# Patient Record
Sex: Female | Born: 1940 | Race: White | Hispanic: No | Marital: Married | Smoking: Former smoker
Health system: Southern US, Community
[De-identification: ages and names within clinical notes are randomized; demographics above are authoritative.]

## PROBLEM LIST (undated history)

## (undated) DIAGNOSIS — J449 Chronic obstructive pulmonary disease, unspecified: Secondary | ICD-10-CM

## (undated) DIAGNOSIS — K219 Gastro-esophageal reflux disease without esophagitis: Secondary | ICD-10-CM

## (undated) DIAGNOSIS — I509 Heart failure, unspecified: Secondary | ICD-10-CM

## (undated) DIAGNOSIS — I10 Essential (primary) hypertension: Secondary | ICD-10-CM

## (undated) DIAGNOSIS — E785 Hyperlipidemia, unspecified: Secondary | ICD-10-CM

## (undated) HISTORY — PX: CATARACT EXTRACTION, BILATERAL: SHX1313

## (undated) HISTORY — PX: VAGINAL HYSTERECTOMY: SUR661

## (undated) HISTORY — DX: Gastro-esophageal reflux disease without esophagitis: K21.9

## (undated) HISTORY — DX: Hyperlipidemia, unspecified: E78.5

## (undated) HISTORY — DX: Heart failure, unspecified: I50.9

## (undated) HISTORY — DX: Chronic obstructive pulmonary disease, unspecified: J44.9

## (undated) HISTORY — DX: Essential (primary) hypertension: I10

---

## 2005-03-20 ENCOUNTER — Ambulatory Visit: Payer: Self-pay | Admitting: Internal Medicine

## 2006-04-16 ENCOUNTER — Ambulatory Visit: Payer: Self-pay | Admitting: Family Medicine

## 2007-04-19 ENCOUNTER — Ambulatory Visit: Payer: Self-pay | Admitting: Family Medicine

## 2008-04-20 ENCOUNTER — Ambulatory Visit: Payer: Self-pay | Admitting: Family Medicine

## 2008-05-01 ENCOUNTER — Ambulatory Visit: Payer: Self-pay | Admitting: Family Medicine

## 2008-11-03 ENCOUNTER — Ambulatory Visit: Payer: Self-pay | Admitting: Family Medicine

## 2009-11-08 ENCOUNTER — Ambulatory Visit: Payer: Self-pay | Admitting: Family Medicine

## 2010-04-22 ENCOUNTER — Ambulatory Visit: Payer: Self-pay | Admitting: Family Medicine

## 2010-07-22 ENCOUNTER — Inpatient Hospital Stay: Payer: Self-pay | Admitting: Internal Medicine

## 2010-08-21 ENCOUNTER — Ambulatory Visit: Payer: Self-pay | Admitting: Gastroenterology

## 2010-09-08 HISTORY — PX: COLONOSCOPY: SHX5424

## 2011-04-29 ENCOUNTER — Ambulatory Visit: Payer: Self-pay | Admitting: Family Medicine

## 2012-05-04 ENCOUNTER — Ambulatory Visit: Payer: Self-pay | Admitting: Family Medicine

## 2013-05-18 ENCOUNTER — Ambulatory Visit: Payer: Self-pay | Admitting: Family Medicine

## 2013-08-16 ENCOUNTER — Ambulatory Visit: Payer: Self-pay | Admitting: Ophthalmology

## 2013-12-16 DIAGNOSIS — R011 Cardiac murmur, unspecified: Secondary | ICD-10-CM | POA: Insufficient documentation

## 2013-12-16 DIAGNOSIS — R0602 Shortness of breath: Secondary | ICD-10-CM | POA: Insufficient documentation

## 2014-07-11 ENCOUNTER — Ambulatory Visit: Payer: Self-pay | Admitting: Family Medicine

## 2014-07-25 ENCOUNTER — Ambulatory Visit: Payer: Self-pay | Admitting: Family Medicine

## 2014-11-01 ENCOUNTER — Ambulatory Visit: Payer: Self-pay | Admitting: Ophthalmology

## 2014-12-29 ENCOUNTER — Other Ambulatory Visit: Payer: Self-pay | Admitting: Family Medicine

## 2014-12-29 DIAGNOSIS — R921 Mammographic calcification found on diagnostic imaging of breast: Secondary | ICD-10-CM

## 2015-01-24 ENCOUNTER — Ambulatory Visit
Admission: RE | Admit: 2015-01-24 | Discharge: 2015-01-24 | Disposition: A | Discharge status: Home / Self Care | Payer: Medicare Other | Source: Ambulatory Visit | Attending: Family Medicine | Admitting: Family Medicine

## 2015-01-24 ENCOUNTER — Other Ambulatory Visit: Payer: Self-pay | Admitting: Family Medicine

## 2015-01-24 ENCOUNTER — Ambulatory Visit: Payer: Self-pay

## 2015-01-24 DIAGNOSIS — R921 Mammographic calcification found on diagnostic imaging of breast: Secondary | ICD-10-CM

## 2015-02-12 ENCOUNTER — Other Ambulatory Visit: Payer: Self-pay

## 2015-02-12 DIAGNOSIS — J302 Other seasonal allergic rhinitis: Secondary | ICD-10-CM

## 2015-02-12 MED ORDER — FLUTICASONE PROPIONATE 50 MCG/ACT NA SUSP: 2.0000 | Freq: Every day | NASAL | Status: DC

## 2015-06-06 ENCOUNTER — Ambulatory Visit (INDEPENDENT_AMBULATORY_CARE_PROVIDER_SITE_OTHER): Payer: Medicare Other | Admitting: Family Medicine

## 2015-06-06 ENCOUNTER — Encounter: Payer: Self-pay | Admitting: Family Medicine

## 2015-06-06 VITALS — BP 120/78 | HR 78 | Ht 64.0 in | Wt 136.0 lb

## 2015-06-06 DIAGNOSIS — E7800 Pure hypercholesterolemia, unspecified: Secondary | ICD-10-CM

## 2015-06-06 DIAGNOSIS — Z23 Encounter for immunization: Secondary | ICD-10-CM | POA: Diagnosis not present

## 2015-06-06 DIAGNOSIS — I429 Cardiomyopathy, unspecified: Secondary | ICD-10-CM | POA: Insufficient documentation

## 2015-06-06 DIAGNOSIS — Z0000 Encounter for general adult medical examination without abnormal findings: Secondary | ICD-10-CM | POA: Diagnosis not present

## 2015-06-06 DIAGNOSIS — K219 Gastro-esophageal reflux disease without esophagitis: Secondary | ICD-10-CM | POA: Insufficient documentation

## 2015-06-06 DIAGNOSIS — I509 Heart failure, unspecified: Secondary | ICD-10-CM | POA: Insufficient documentation

## 2015-06-06 DIAGNOSIS — Z1239 Encounter for other screening for malignant neoplasm of breast: Secondary | ICD-10-CM

## 2015-06-06 DIAGNOSIS — J449 Chronic obstructive pulmonary disease, unspecified: Secondary | ICD-10-CM | POA: Insufficient documentation

## 2015-06-06 DIAGNOSIS — N189 Chronic kidney disease, unspecified: Secondary | ICD-10-CM | POA: Insufficient documentation

## 2015-06-06 DIAGNOSIS — N1832 Chronic kidney disease, stage 3b: Secondary | ICD-10-CM | POA: Insufficient documentation

## 2015-06-06 MED ORDER — OMEPRAZOLE 20 MG PO CPDR: 20.0000 mg | DELAYED_RELEASE_CAPSULE | Freq: Every day | ORAL | Status: DC

## 2015-06-06 MED ORDER — ATORVASTATIN CALCIUM 20 MG PO TABS: 20.0000 mg | ORAL_TABLET | Freq: Every day | ORAL | Status: DC

## 2015-06-06 MED ORDER — FLUTICASONE-SALMETEROL 250-50 MCG/DOSE IN AEPB: 1.0000 | INHALATION_SPRAY | Freq: Two times a day (BID) | RESPIRATORY_TRACT | Status: DC

## 2015-06-06 NOTE — Progress Notes (Signed)
Name: Marie Villa   MRN: [id number]    DOB: [birth year]   Date:06/06/2015       Progress Note  Subjective  Chief Complaint  Chief Complaint  Patient presents with  . Medicare Wellness  . COPD  . Allergic Rhinitis   . Hyperlipidemia    Hyperlipidemia This is a chronic problem. The current episode started more than 1 year ago. The problem is controlled. Recent lipid tests were reviewed and are normal. She has no history of chronic renal disease, diabetes, hypothyroidism, liver disease, obesity or nephrotic syndrome. There are no known factors aggravating her hyperlipidemia. Pertinent negatives include no chest pain, focal sensory loss, focal weakness, leg pain, myalgias or shortness of breath. Current antihyperlipidemic treatment includes statins. The current treatment provides no improvement of lipids. There are no compliance problems.  Risk factors for coronary artery disease include dyslipidemia.    No problem-specific assessment & plan notes found for this encounter.   Past Medical History  Diagnosis Date  . GERD (gastroesophageal reflux disease)   . COPD (chronic obstructive pulmonary disease)   . Hyperlipidemia   . Congestive heart disease     Past Surgical History  Procedure Laterality Date  . Vaginal hysterectomy    . Colonoscopy  2012    normal- cleared for 10- Iftikhar    History reviewed. No pertinent family history.  Social History   Social History  . Marital Status: Married    Spouse Name: N/A  . Number of Children: N/A  . Years of Education: N/A   Occupational History  . Not on file.   Social History Main Topics  . Smoking status: Former Research scientist (life sciences)  . Smokeless tobacco: Not on file  . Alcohol Use: No  . Drug Use: No  . Sexual Activity: Yes   Other Topics Concern  . Not on file   Social History Narrative    Allergies  Allergen Reactions  . Sulfa Antibiotics      Review of Systems  Constitutional: Negative for fever, chills, weight loss and  malaise/fatigue.  HENT: Negative for ear discharge, ear pain and sore throat.   Eyes: Negative for blurred vision.  Respiratory: Negative for cough, sputum production, shortness of breath and wheezing.   Cardiovascular: Negative for chest pain, palpitations and leg swelling.  Gastrointestinal: Negative for heartburn, nausea, abdominal pain, diarrhea, constipation, blood in stool and melena.  Genitourinary: Negative for dysuria, urgency, frequency and hematuria.  Musculoskeletal: Negative for myalgias, back pain, joint pain and neck pain.  Skin: Negative for rash.  Neurological: Negative for dizziness, tingling, sensory change, focal weakness and headaches.  Endo/Heme/Allergies: Negative for environmental allergies and polydipsia. Does not bruise/bleed easily.  Psychiatric/Behavioral: Negative for depression and suicidal ideas. The patient is not nervous/anxious and does not have insomnia.      Objective  Filed Vitals:   06/06/15 0829  BP: 120/78  Pulse: 78  Height: 5\' 4"  (1.626 m)  Weight: 136 lb (61.689 kg)    Physical Exam  Constitutional: She is well-developed, well-nourished, and in no distress. No distress.  HENT:  Head: Normocephalic and atraumatic.  Right Ear: External ear normal.  Left Ear: External ear normal.  Nose: Nose normal.  Mouth/Throat: Oropharynx is clear and moist.  Eyes: Conjunctivae and EOM are normal. Pupils are equal, round, and reactive to light. Right eye exhibits no discharge. Left eye exhibits no discharge.  Neck: Normal range of motion. Neck supple. No JVD present. No thyromegaly present.  Cardiovascular: Normal rate, regular rhythm, normal  heart sounds and intact distal pulses.  Exam reveals no gallop and no friction rub.   No murmur heard. Pulmonary/Chest: Effort normal and breath sounds normal.  Abdominal: Soft. Bowel sounds are normal. She exhibits no mass. There is no tenderness. There is no guarding.  Musculoskeletal: Normal range of motion.  She exhibits no edema.  Lymphadenopathy:    She has no cervical adenopathy.  Neurological: She is alert. She has normal reflexes.  Skin: Skin is warm and dry. She is not diaphoretic.  Psychiatric: Mood and affect normal.      Assessment & Plan  Problem List Items Addressed This Visit      Digestive   Acid reflux   Relevant Medications   omeprazole (PRILOSEC) 20 MG capsule     Other   Hypercholesteremia   Relevant Medications   atorvastatin (LIPITOR) 20 MG tablet   carvedilol (COREG) 3.125 MG tablet   ramipril (ALTACE) 2.5 MG capsule    Other Visit Diagnoses    Medicare annual wellness visit, subsequent    -  Primary    Breast cancer screening             Dr. Macon [facility]  06/06/2015

## 2015-06-07 LAB — RENAL FUNCTION PANEL
ALBUMIN: 4.4 g/dL (ref 3.5–4.8)
BUN/Creatinine Ratio: 15 (ref 11–26)
BUN: 17 mg/dL (ref 8–27)
CO2: 22 mmol/L (ref 18–29)
CREATININE: 1.13 mg/dL — AB (ref 0.57–1.00)
Calcium: 9.3 mg/dL (ref 8.7–10.3)
Chloride: 103 mmol/L (ref 97–108)
GFR calc Af Amer: 55 mL/min/{1.73_m2} — ABNORMAL LOW (ref >59)
GFR, EST NON AFRICAN AMERICAN: 47 mL/min/{1.73_m2} — AB (ref >59)
Glucose: 80 mg/dL (ref 65–99)
PHOSPHORUS: 3.5 mg/dL (ref 2.5–4.5)
Potassium: 5 mmol/L (ref 3.5–5.2)
Sodium: 141 mmol/L (ref 134–144)

## 2015-06-07 LAB — LIPID PANEL
CHOL/HDL RATIO: 3.2 ratio (ref 0.0–4.4)
Cholesterol, Total: 207 mg/dL — ABNORMAL HIGH (ref 100–199)
HDL: 64 mg/dL (ref >39)
LDL CALC: 98 mg/dL (ref 0–99)
TRIGLYCERIDES: 223 mg/dL — AB (ref 0–149)
VLDL Cholesterol Cal: 45 mg/dL — ABNORMAL HIGH (ref 5–40)

## 2015-07-04 ENCOUNTER — Ambulatory Visit (INDEPENDENT_AMBULATORY_CARE_PROVIDER_SITE_OTHER): Payer: Medicare Other | Admitting: Family Medicine

## 2015-07-04 ENCOUNTER — Encounter: Payer: Self-pay | Admitting: Family Medicine

## 2015-07-04 VITALS — BP 110/60 | HR 76 | Ht 65.0 in | Wt 140.0 lb

## 2015-07-04 DIAGNOSIS — J441 Chronic obstructive pulmonary disease with (acute) exacerbation: Secondary | ICD-10-CM | POA: Diagnosis not present

## 2015-07-04 DIAGNOSIS — J0100 Acute maxillary sinusitis, unspecified: Secondary | ICD-10-CM

## 2015-07-04 MED ORDER — AMOXICILLIN 500 MG PO CAPS: 500.0000 mg | ORAL_CAPSULE | Freq: Three times a day (TID) | ORAL | Status: DC

## 2015-07-04 MED ORDER — GUAIFENESIN-CODEINE 100-10 MG/5ML PO SOLN: 5.0000 mL | Freq: Three times a day (TID) | ORAL | Status: DC | PRN

## 2015-07-04 MED ORDER — PREDNISONE 10 MG PO TABS: 10.0000 mg | ORAL_TABLET | Freq: Every day | ORAL | Status: DC

## 2015-07-04 NOTE — Progress Notes (Signed)
Name: Marie Villa   MRN: [id number]    DOB: 02-Nov-1940   Date:07/04/2015       Progress Note  Subjective  Chief Complaint  Chief Complaint  Patient presents with  . Sinusitis    Sinusitis This is a new problem. The current episode started in the past 7 days. The problem has been gradually improving since onset. There has been no fever (low grade). The pain is mild. Associated symptoms include congestion, coughing, headaches, shortness of breath, sinus pressure, a sore throat and swollen glands. Pertinent negatives include no chills, diaphoresis, ear pain, hoarse voice, neck pain or sneezing. (Walking stairs) Past treatments include nothing. The treatment provided mild relief.    No problem-specific assessment & plan notes found for this encounter.   Past Medical History  Diagnosis Date  . GERD (gastroesophageal reflux disease)   . COPD (chronic obstructive pulmonary disease) ([place])   . Hyperlipidemia   . Congestive heart disease [facility])     Past Surgical History  Procedure Laterality Date  . Vaginal hysterectomy    . Colonoscopy  2012    normal- cleared for 10- Iftikhar    History reviewed. No pertinent family history.  Social History   Social History  . Marital Status: Married    Spouse Name: N/A  . Number of Children: N/A  . Years of Education: N/A   Occupational History  . Not on file.   Social History Main Topics  . Smoking status: Former Research scientist (life sciences)  . Smokeless tobacco: Not on file  . Alcohol Use: No  . Drug Use: No  . Sexual Activity: Yes   Other Topics Concern  . Not on file   Social History Narrative    Allergies  Allergen Reactions  . Sulfa Antibiotics      Review of Systems  Constitutional: Negative for fever, chills, weight loss, malaise/fatigue and diaphoresis.  HENT: Positive for congestion, sinus pressure and sore throat. Negative for ear discharge, ear pain, hoarse voice and sneezing.   Eyes: Negative for blurred vision.  Respiratory:  Positive for cough and shortness of breath. Negative for sputum production and wheezing.   Cardiovascular: Negative for chest pain, palpitations and leg swelling.  Gastrointestinal: Negative for heartburn, nausea, abdominal pain, diarrhea, constipation, blood in stool and melena.  Genitourinary: Negative for dysuria, urgency, frequency and hematuria.  Musculoskeletal: Negative for myalgias, back pain, joint pain and neck pain.  Skin: Negative for rash.  Neurological: Positive for headaches. Negative for dizziness, tingling, sensory change and focal weakness.  Endo/Heme/Allergies: Negative for environmental allergies and polydipsia. Does not bruise/bleed easily.  Psychiatric/Behavioral: Negative for depression and suicidal ideas. The patient is not nervous/anxious and does not have insomnia.      Objective  Filed Vitals:   07/04/15 1025  BP: 110/60  Pulse: 76  Height: 5\' 5"  (1.651 m)  Weight: 140 lb (63.504 kg)    Physical Exam  Constitutional: She is well-developed, well-nourished, and in no distress. No distress.  HENT:  Head: Normocephalic and atraumatic.  Right Ear: External ear normal.  Left Ear: External ear normal.  Nose: Nose normal.  Mouth/Throat: Oropharynx is clear and moist.  Eyes: Conjunctivae and EOM are normal. Pupils are equal, round, and reactive to light. Right eye exhibits no discharge. Left eye exhibits no discharge.  Neck: Normal range of motion. Neck supple. No JVD present. No thyromegaly present.  Cardiovascular: Normal rate, regular rhythm, normal heart sounds and intact distal pulses.  Exam reveals no gallop and no friction rub.  No murmur heard. Pulmonary/Chest: Effort normal and breath sounds normal.  Abdominal: Soft. Bowel sounds are normal. She exhibits no mass. There is no tenderness. There is no guarding.  Musculoskeletal: Normal range of motion. She exhibits no edema.  Lymphadenopathy:    She has no cervical adenopathy.  Neurological: She is  alert. She has normal reflexes.  Skin: Skin is warm and dry. She is not diaphoretic.  Psychiatric: Mood and affect normal.  Nursing note and vitals reviewed.     Assessment & Plan  Problem List Items Addressed This Visit    None    Visit Diagnoses    Acute maxillary sinusitis, recurrence not specified    -  Primary    Relevant Medications    predniSONE (DELTASONE) 10 MG tablet    amoxicillin (AMOXIL) 500 MG capsule    guaiFENesin-codeine 100-10 MG/5ML syrup    Chronic obstructive pulmonary disease with acute exacerbation (HCC)        Relevant Medications    predniSONE (DELTASONE) 10 MG tablet    guaiFENesin-codeine 100-10 MG/5ML syrup         Dr. [facility]  07/04/2015

## 2015-10-16 ENCOUNTER — Other Ambulatory Visit: Payer: Self-pay

## 2015-12-05 ENCOUNTER — Ambulatory Visit (INDEPENDENT_AMBULATORY_CARE_PROVIDER_SITE_OTHER): Payer: Medicare Other | Admitting: Family Medicine

## 2015-12-05 ENCOUNTER — Encounter: Payer: Self-pay | Admitting: Family Medicine

## 2015-12-05 VITALS — BP 100/62 | HR 84 | Ht 65.0 in | Wt 141.0 lb

## 2015-12-05 DIAGNOSIS — E7800 Pure hypercholesterolemia, unspecified: Secondary | ICD-10-CM | POA: Diagnosis not present

## 2015-12-05 DIAGNOSIS — N182 Chronic kidney disease, stage 2 (mild): Secondary | ICD-10-CM

## 2015-12-05 DIAGNOSIS — J0100 Acute maxillary sinusitis, unspecified: Secondary | ICD-10-CM | POA: Diagnosis not present

## 2015-12-05 DIAGNOSIS — J302 Other seasonal allergic rhinitis: Secondary | ICD-10-CM | POA: Diagnosis not present

## 2015-12-05 DIAGNOSIS — J441 Chronic obstructive pulmonary disease with (acute) exacerbation: Secondary | ICD-10-CM

## 2015-12-05 MED ORDER — ATORVASTATIN CALCIUM 20 MG PO TABS: 20.0000 mg | ORAL_TABLET | Freq: Every day | ORAL | Status: DC

## 2015-12-05 MED ORDER — FLUTICASONE PROPIONATE 50 MCG/ACT NA SUSP: 2.0000 | Freq: Every day | NASAL | Status: DC

## 2015-12-05 MED ORDER — PREDNISONE 10 MG PO TABS: 10.0000 mg | ORAL_TABLET | Freq: Every day | ORAL | Status: DC

## 2015-12-05 MED ORDER — AZITHROMYCIN 250 MG PO TABS: ORAL_TABLET | ORAL | Status: DC

## 2015-12-05 MED ORDER — FLUTICASONE-SALMETEROL 250-50 MCG/DOSE IN AEPB: 1.0000 | INHALATION_SPRAY | Freq: Two times a day (BID) | RESPIRATORY_TRACT | Status: DC

## 2015-12-05 NOTE — Progress Notes (Signed)
Name: Marie Villa   MRN: [id number]    DOB: 1941/01/29   Date:12/05/2015       Progress Note  Subjective  Chief Complaint  Chief Complaint  Patient presents with  . Hyperlipidemia  . Allergic Rhinitis   . Asthma    Hyperlipidemia This is a chronic problem. The current episode started more than 1 year ago. The problem is controlled. Recent lipid tests were reviewed and are normal. She has no history of chronic renal disease, diabetes, hypothyroidism, liver disease, obesity or nephrotic syndrome. There are no known factors aggravating her hyperlipidemia. Pertinent negatives include no chest pain, focal sensory loss, focal weakness, leg pain, myalgias or shortness of breath. Current antihyperlipidemic treatment includes statins. The current treatment provides moderate improvement of lipids. There are no compliance problems.   Asthma She complains of wheezing. There is no chest tightness, cough, difficulty breathing, frequent throat clearing, hemoptysis, shortness of breath or sputum production. This is a recurrent problem. The problem has been waxing and waning. Associated symptoms include nasal congestion and postnasal drip. Pertinent negatives include no appetite change, chest pain, ear pain, fever, headaches, heartburn, malaise/fatigue, myalgias, sneezing, sore throat or weight loss. Her symptoms are alleviated by beta-agonist. Her past medical history is significant for asthma and COPD.  Sinusitis This is a new problem. The current episode started in the past 7 days. There has been no fever. Associated symptoms include congestion. Pertinent negatives include no chills, coughing, diaphoresis, ear pain, headaches, neck pain, shortness of breath, sneezing or sore throat. Past treatments include acetaminophen. The treatment provided moderate relief.  Cough This is a new problem. The cough is productive of purulent sputum. Associated symptoms include nasal congestion, postnasal drip and wheezing.  Pertinent negatives include no chest pain, chills, ear pain, fever, headaches, heartburn, hemoptysis, myalgias, rash, sore throat, shortness of breath or weight loss. The symptoms are aggravated by pollens. She has tried a beta-agonist inhaler and steroid inhaler for the symptoms. Her past medical history is significant for asthma and COPD. There is no history of environmental allergies.    No problem-specific assessment & plan notes found for this encounter.   Past Medical History  Diagnosis Date  . GERD (gastroesophageal reflux disease)   . COPD (chronic obstructive pulmonary disease) ([place])   . Hyperlipidemia   . Congestive heart disease [facility])     Past Surgical History  Procedure Laterality Date  . Vaginal hysterectomy    . Colonoscopy  2012    normal- cleared for 10- Iftikhar    History reviewed. No pertinent family history.  Social History   Social History  . Marital Status: Married    Spouse Name: N/A  . Number of Children: N/A  . Years of Education: N/A   Occupational History  . Not on file.   Social History Main Topics  . Smoking status: Former Research scientist (life sciences)  . Smokeless tobacco: Not on file  . Alcohol Use: No  . Drug Use: No  . Sexual Activity: Yes   Other Topics Concern  . Not on file   Social History Narrative    Allergies  Allergen Reactions  . Sulfa Antibiotics      Review of Systems  Constitutional: Negative for fever, chills, weight loss, malaise/fatigue, diaphoresis and appetite change.  HENT: Positive for congestion and postnasal drip. Negative for ear discharge, ear pain, sneezing and sore throat.   Eyes: Negative for blurred vision.  Respiratory: Positive for wheezing. Negative for cough, hemoptysis, sputum production and shortness of breath.  Cardiovascular: Negative for chest pain, palpitations and leg swelling.  Gastrointestinal: Negative for heartburn, nausea, abdominal pain, diarrhea, constipation, blood in stool and melena.  Genitourinary:  Negative for dysuria, urgency, frequency and hematuria.  Musculoskeletal: Negative for myalgias, back pain, joint pain and neck pain.  Skin: Negative for rash.  Neurological: Negative for dizziness, tingling, sensory change, focal weakness and headaches.  Endo/Heme/Allergies: Negative for environmental allergies and polydipsia. Does not bruise/bleed easily.  Psychiatric/Behavioral: Negative for depression and suicidal ideas. The patient is not nervous/anxious and does not have insomnia.      Objective  Filed Vitals:   12/05/15 0901  BP: 100/62  Pulse: 84  Height: 5\' 5"  (1.651 m)  Weight: 141 lb (63.957 kg)    Physical Exam  Constitutional: She is well-developed, well-nourished, and in no distress. No distress.  HENT:  Head: Normocephalic and atraumatic.  Right Ear: Tympanic membrane, external ear and ear canal normal.  Left Ear: Tympanic membrane, external ear and ear canal normal.  Nose: Nose normal.  Mouth/Throat: Oropharynx is clear and moist.  Eyes: Conjunctivae and EOM are normal. Pupils are equal, round, and reactive to light. Right eye exhibits no discharge. Left eye exhibits no discharge.  Neck: Normal range of motion. Neck supple. No JVD present. No thyromegaly present.  Cardiovascular: Normal rate, regular rhythm, normal heart sounds and intact distal pulses.  Exam reveals no gallop and no friction rub.   No murmur heard. Pulmonary/Chest: Effort normal. No respiratory distress. She has wheezes. She has no rales.  Abdominal: Soft. Bowel sounds are normal. She exhibits no mass. There is no tenderness. There is no guarding.  Musculoskeletal: Normal range of motion. She exhibits no edema.  Lymphadenopathy:    She has no cervical adenopathy.  Neurological: She is alert.  Skin: Skin is warm and dry. She is not diaphoretic.  Psychiatric: Mood and affect normal.  Nursing note and vitals reviewed.     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   Relevant Medications   predniSONE (DELTASONE) 10 MG tablet   Fluticasone-Salmeterol (ADVAIR) 250-50 MCG/DOSE AEPB   fluticasone (FLONASE) 50 MCG/ACT nasal spray   azithromycin (ZITHROMAX) 250 MG tablet     Genitourinary   Chronic kidney disease     Other   Hypercholesteremia   Relevant Medications   atorvastatin (LIPITOR) 20 MG tablet   Other Relevant Orders   Lipid Profile    Other Visit Diagnoses    Acute maxillary sinusitis, recurrence not specified        Relevant Medications    predniSONE (DELTASONE) 10 MG tablet    fluticasone (FLONASE) 50 MCG/ACT nasal spray    azithromycin (ZITHROMAX) 250 MG tablet    Other seasonal allergic rhinitis        Relevant Medications    fluticasone (FLONASE) 50 MCG/ACT nasal spray         Dr. Deanna [facility]  12/05/2015

## 2015-12-06 LAB — LIPID PANEL
CHOL/HDL RATIO: 2.8 ratio (ref 0.0–4.4)
CHOLESTEROL TOTAL: 183 mg/dL (ref 100–199)
HDL: 66 mg/dL (ref >39)
LDL CALC: 81 mg/dL (ref 0–99)
Triglycerides: 182 mg/dL — ABNORMAL HIGH (ref 0–149)
VLDL CHOLESTEROL CAL: 36 mg/dL (ref 5–40)

## 2015-12-24 ENCOUNTER — Encounter: Payer: Self-pay | Admitting: Family Medicine

## 2015-12-24 ENCOUNTER — Ambulatory Visit (INDEPENDENT_AMBULATORY_CARE_PROVIDER_SITE_OTHER): Payer: Medicare Other | Admitting: Family Medicine

## 2015-12-24 VITALS — BP 120/64 | HR 70 | Ht 65.0 in | Wt 140.0 lb

## 2015-12-24 DIAGNOSIS — J40 Bronchitis, not specified as acute or chronic: Secondary | ICD-10-CM | POA: Diagnosis not present

## 2015-12-24 DIAGNOSIS — J441 Chronic obstructive pulmonary disease with (acute) exacerbation: Secondary | ICD-10-CM

## 2015-12-24 MED ORDER — LEVOFLOXACIN 500 MG PO TABS: 500.0000 mg | ORAL_TABLET | Freq: Every day | ORAL | Status: DC

## 2015-12-24 NOTE — Progress Notes (Signed)
Name: Marie Villa   MRN: [id number]    DOB: 13-Jan-1941   Date:12/24/2015       Progress Note  Subjective  Chief Complaint  Chief Complaint  Patient presents with  . Sinusitis    cough and cong- finished ZPack and Pred 1 week ago    Sinusitis This is a chronic problem. The current episode started 1 to 4 weeks ago. The problem is unchanged. There has been no fever. Associated symptoms include congestion, coughing, shortness of breath and sinus pressure. Pertinent negatives include no chills, diaphoresis, ear pain, headaches, hoarse voice, neck pain, sneezing, sore throat or swollen glands. Past treatments include nothing. The treatment provided mild relief.  Cough This is a chronic problem. The current episode started 1 to 4 weeks ago. The problem has been unchanged. The cough is productive of sputum. Associated symptoms include shortness of breath and wheezing. Pertinent negatives include no chest pain, chills, ear congestion, ear pain, fever, headaches, heartburn, hemoptysis, myalgias, nasal congestion, rash, sore throat or weight loss. The symptoms are aggravated by pollens. She has tried a beta-agonist inhaler and oral steroids for the symptoms. The treatment provided mild relief. Her past medical history is significant for asthma, bronchitis and COPD. There is no history of bronchiectasis, environmental allergies or pneumonia.    No problem-specific assessment & plan notes found for this encounter.   Past Medical History  Diagnosis Date  . GERD (gastroesophageal reflux disease)   . COPD (chronic obstructive pulmonary disease) ([place])   . Hyperlipidemia   . Congestive heart disease [facility])     Past Surgical History  Procedure Laterality Date  . Vaginal hysterectomy    . Colonoscopy  2012    normal- cleared for 10- Iftikhar    History reviewed. No pertinent family history.  Social History   Social History  . Marital Status: Married    Spouse Name: N/A  . Number of Children:  N/A  . Years of Education: N/A   Occupational History  . Not on file.   Social History Main Topics  . Smoking status: Former Research scientist (life sciences)  . Smokeless tobacco: Not on file  . Alcohol Use: No  . Drug Use: No  . Sexual Activity: Yes   Other Topics Concern  . Not on file   Social History Narrative    Allergies  Allergen Reactions  . Sulfa Antibiotics      Review of Systems  Constitutional: Negative for fever, chills, weight loss, malaise/fatigue and diaphoresis.  HENT: Positive for congestion and sinus pressure. Negative for ear discharge, ear pain, hoarse voice, sneezing and sore throat.   Eyes: Negative for blurred vision.  Respiratory: Positive for cough, shortness of breath and wheezing. Negative for hemoptysis and sputum production.   Cardiovascular: Negative for chest pain, palpitations and leg swelling.  Gastrointestinal: Negative for heartburn, nausea, abdominal pain, diarrhea, constipation, blood in stool and melena.  Genitourinary: Negative for dysuria, urgency, frequency and hematuria.  Musculoskeletal: Negative for myalgias, back pain, joint pain and neck pain.  Skin: Negative for rash.  Neurological: Negative for dizziness, tingling, sensory change, focal weakness and headaches.  Endo/Heme/Allergies: Negative for environmental allergies and polydipsia. Does not bruise/bleed easily.  Psychiatric/Behavioral: Negative for depression and suicidal ideas. The patient is not nervous/anxious and does not have insomnia.      Objective  Filed Vitals:   12/24/15 1105  BP: 120/64  Pulse: 70  Height: 5\' 5"  (1.651 m)  Weight: 140 lb (63.504 kg)    Physical Exam  Constitutional: She is well-developed, well-nourished, and in no distress. No distress.  HENT:  Head: Normocephalic and atraumatic.  Right Ear: External ear normal.  Left Ear: External ear normal.  Nose: Nose normal.  Mouth/Throat: Oropharynx is clear and moist.  Eyes: Conjunctivae and EOM are normal. Pupils  are equal, round, and reactive to light. Right eye exhibits no discharge. Left eye exhibits no discharge.  Neck: Normal range of motion. Neck supple. No JVD present. No thyromegaly present.  Cardiovascular: Normal rate, regular rhythm, normal heart sounds and intact distal pulses.  Exam reveals no gallop and no friction rub.   No murmur heard. Pulmonary/Chest: Effort normal and breath sounds normal.  Abdominal: Soft. Bowel sounds are normal. She exhibits no mass. There is no tenderness. There is no guarding.  Musculoskeletal: Normal range of motion. She exhibits no edema.  Lymphadenopathy:    She has no cervical adenopathy.  Neurological: She is alert. She has normal reflexes.  Skin: Skin is warm and dry. She is not diaphoretic.  Psychiatric: Mood and affect normal.  Nursing note and vitals reviewed.     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 Deanna Jones [facility]  12/24/2015

## 2016-01-24 ENCOUNTER — Other Ambulatory Visit: Payer: Self-pay

## 2016-01-24 DIAGNOSIS — R920 Mammographic microcalcification found on diagnostic imaging of breast: Secondary | ICD-10-CM

## 2016-01-28 ENCOUNTER — Other Ambulatory Visit: Payer: Self-pay

## 2016-01-28 DIAGNOSIS — K219 Gastro-esophageal reflux disease without esophagitis: Secondary | ICD-10-CM

## 2016-01-28 MED ORDER — OMEPRAZOLE 20 MG PO CPDR: 20.0000 mg | DELAYED_RELEASE_CAPSULE | Freq: Every day | ORAL | Status: DC

## 2016-02-07 ENCOUNTER — Other Ambulatory Visit: Payer: Medicare Other

## 2016-02-07 ENCOUNTER — Ambulatory Visit: Payer: Medicare Other

## 2016-02-11 ENCOUNTER — Ambulatory Visit
Admission: RE | Admit: 2016-02-11 | Discharge: 2016-02-11 | Disposition: A | Discharge status: Home / Self Care | Payer: Medicare Other | Source: Ambulatory Visit | Attending: Family Medicine | Admitting: Family Medicine

## 2016-02-11 DIAGNOSIS — R920 Mammographic microcalcification found on diagnostic imaging of breast: Secondary | ICD-10-CM | POA: Diagnosis not present

## 2016-05-28 ENCOUNTER — Other Ambulatory Visit: Payer: Self-pay | Admitting: Family Medicine

## 2016-05-28 DIAGNOSIS — J302 Other seasonal allergic rhinitis: Secondary | ICD-10-CM

## 2016-05-29 ENCOUNTER — Other Ambulatory Visit: Payer: Self-pay | Admitting: Family Medicine

## 2016-05-29 DIAGNOSIS — J302 Other seasonal allergic rhinitis: Secondary | ICD-10-CM

## 2016-06-09 ENCOUNTER — Encounter: Payer: Self-pay | Admitting: Family Medicine

## 2016-06-09 ENCOUNTER — Ambulatory Visit (INDEPENDENT_AMBULATORY_CARE_PROVIDER_SITE_OTHER): Payer: Medicare Other | Admitting: Family Medicine

## 2016-06-09 VITALS — BP 110/70 | HR 70 | Ht 63.0 in | Wt 143.0 lb

## 2016-06-09 DIAGNOSIS — E7800 Pure hypercholesterolemia, unspecified: Secondary | ICD-10-CM

## 2016-06-09 DIAGNOSIS — K219 Gastro-esophageal reflux disease without esophagitis: Secondary | ICD-10-CM | POA: Diagnosis not present

## 2016-06-09 DIAGNOSIS — J302 Other seasonal allergic rhinitis: Secondary | ICD-10-CM

## 2016-06-09 DIAGNOSIS — Z23 Encounter for immunization: Secondary | ICD-10-CM | POA: Diagnosis not present

## 2016-06-09 DIAGNOSIS — J432 Centrilobular emphysema: Secondary | ICD-10-CM

## 2016-06-09 DIAGNOSIS — J441 Chronic obstructive pulmonary disease with (acute) exacerbation: Secondary | ICD-10-CM

## 2016-06-09 DIAGNOSIS — J301 Allergic rhinitis due to pollen: Secondary | ICD-10-CM | POA: Diagnosis not present

## 2016-06-09 DIAGNOSIS — J410 Simple chronic bronchitis: Secondary | ICD-10-CM | POA: Insufficient documentation

## 2016-06-09 MED ORDER — OMEPRAZOLE 20 MG PO CPDR: 20.0000 mg | DELAYED_RELEASE_CAPSULE | Freq: Every day | ORAL | 1 refills | Status: DC

## 2016-06-09 MED ORDER — ATORVASTATIN CALCIUM 20 MG PO TABS: 20.0000 mg | ORAL_TABLET | Freq: Every day | ORAL | 1 refills | Status: DC

## 2016-06-09 MED ORDER — FLUTICASONE-SALMETEROL 250-50 MCG/DOSE IN AEPB: 1.0000 | INHALATION_SPRAY | Freq: Two times a day (BID) | RESPIRATORY_TRACT | 1 refills | Status: DC

## 2016-06-09 MED ORDER — FLUTICASONE PROPIONATE 50 MCG/ACT NA SUSP
2.0000 | Freq: Every day | NASAL | 0 refills | Status: DC
Start: 2016-06-09 — End: 2016-12-08

## 2016-06-09 NOTE — Progress Notes (Signed)
Name: Marie Villa   MRN: [id number]    DOB: Oct 07, 1940   Date:06/09/2016       Progress Note  Subjective  Chief Complaint  Chief Complaint  Patient presents with  . Allergic Rhinitis   . Hyperlipidemia  . COPD    Hyperlipidemia  This is a chronic problem. The current episode started more than 1 year ago. The problem is controlled. Recent lipid tests were reviewed and are normal. She has no history of chronic renal disease, diabetes, hypothyroidism, liver disease, obesity or nephrotic syndrome. Pertinent negatives include no chest pain, focal sensory loss, focal weakness, leg pain, myalgias or shortness of breath. Current antihyperlipidemic treatment includes statins. The current treatment provides moderate improvement of lipids. There are no compliance problems.  Risk factors for coronary artery disease include dyslipidemia and hypertension.  URI   Chronicity: allrgic rhinitis. The current episode started more than 1 year ago. The problem has been waxing and waning. There has been no fever. Associated symptoms include congestion. Pertinent negatives include no abdominal pain, chest pain, coughing, diarrhea, dysuria, ear pain, headaches, joint pain, joint swelling, nausea, neck pain, plugged ear sensation, rash, rhinorrhea, sinus pain, sneezing, sore throat, swollen glands, vomiting or wheezing. She has tried antihistamine (nasal steroid) for the symptoms. The treatment provided moderate relief.  Shortness of Breath  This is a chronic problem. The current episode started more than 1 year ago. The problem occurs intermittently. The problem has been waxing and waning. Pertinent negatives include no abdominal pain, chest pain, ear pain, fever, headaches, leg pain, leg swelling, neck pain, PND, rash, rhinorrhea, sore throat, sputum production, swollen glands, vomiting or wheezing. Nothing aggravates the symptoms. She has tried beta agonist inhalers and steroid inhalers for the symptoms. The treatment  provided moderate relief. There is no history of allergies, aspirin allergies, asthma, bronchiolitis, CAD, chronic lung disease, COPD, DVT, a heart failure, PE, pneumonia or a recent surgery.  Gastroesophageal Reflux  She reports no abdominal pain, no chest pain, no coughing, no dysphagia, no globus sensation, no heartburn, no nausea, no sore throat or no wheezing. This is a chronic problem. The current episode started more than 1 year ago. The problem occurs occasionally. The problem has been waxing and waning. The symptoms are aggravated by certain foods. Pertinent negatives include no melena or weight loss. She has tried a PPI for the symptoms. The treatment provided moderate relief.    No problem-specific Assessment & Plan notes found for this encounter.   Past Medical History:  Diagnosis Date  . Congestive heart disease ([place])   . COPD (chronic obstructive pulmonary disease) ([place])   . GERD (gastroesophageal reflux disease)   . Hyperlipidemia     Past Surgical History:  Procedure Laterality Date  . COLONOSCOPY  2012   normal- cleared for 10- Iftikhar  . VAGINAL HYSTERECTOMY      No family history on file.  Social History   Social History  . Marital status: Married    Spouse name: N/A  . Number of children: N/A  . Years of education: N/A   Occupational History  . Not on file.   Social History Main Topics  . Smoking status: Former Research scientist (life sciences)  . Smokeless tobacco: Not on file  . Alcohol use No  . Drug use: No  . Sexual activity: Yes   Other Topics Concern  . Not on file   Social History Narrative  . No narrative on file    Allergies  Allergen Reactions  . Sulfa  Antibiotics      Review of Systems  Constitutional: Negative for chills, fever, malaise/fatigue and weight loss.  HENT: Positive for congestion. Negative for ear discharge, ear pain, rhinorrhea, sneezing and sore throat.   Eyes: Negative for blurred vision.  Respiratory: Negative for cough, sputum  production, shortness of breath and wheezing.   Cardiovascular: Negative for chest pain, palpitations, leg swelling and PND.  Gastrointestinal: Negative for abdominal pain, blood in stool, constipation, diarrhea, dysphagia, heartburn, melena, nausea and vomiting.  Genitourinary: Negative for dysuria, frequency, hematuria and urgency.  Musculoskeletal: Negative for back pain, joint pain, myalgias and neck pain.  Skin: Negative for rash.  Neurological: Negative for dizziness, tingling, sensory change, focal weakness and headaches.  Endo/Heme/Allergies: Negative for environmental allergies and polydipsia. Does not bruise/bleed easily.  Psychiatric/Behavioral: Negative for depression and suicidal ideas. The patient is not nervous/anxious and does not have insomnia.      Objective  Vitals:   06/09/16 0900  BP: 110/70  Pulse: 70  Weight: 143 lb (64.9 kg)  Height: 5\' 3"  (1.6 m)    Physical Exam  Constitutional: She is well-developed, well-nourished, and in no distress. No distress.  HENT:  Head: Normocephalic and atraumatic.  Right Ear: External ear normal.  Left Ear: External ear normal.  Nose: Nose normal.  Mouth/Throat: Oropharynx is clear and moist.  Eyes: Conjunctivae and EOM are normal. Pupils are equal, round, and reactive to light. Right eye exhibits no discharge. Left eye exhibits no discharge.  Neck: Normal range of motion. Neck supple. No JVD present. No thyromegaly present.  Cardiovascular: Normal rate, regular rhythm, normal heart sounds and intact distal pulses.  Exam reveals no gallop and no friction rub.   No murmur heard. Pulmonary/Chest: Effort normal and breath sounds normal.  Abdominal: Soft. Bowel sounds are normal. She exhibits no mass. There is no tenderness. There is no guarding.  Musculoskeletal: Normal range of motion. She exhibits no edema.  Lymphadenopathy:    She has no cervical adenopathy.  Neurological: She is alert. She has normal reflexes.  Skin: Skin  is warm and dry. She is not diaphoretic.  Psychiatric: Mood and affect normal.  Nursing note and vitals reviewed.     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     Digestive   Acid reflux   Relevant Medications   omeprazole (PRILOSEC) 20 MG capsule     Other   Hypercholesteremia   Relevant Medications   atorvastatin (LIPITOR) 20 MG tablet   Other Relevant Orders   Lipid panel    Other Visit Diagnoses    Other seasonal allergic rhinitis       Relevant Medications   fluticasone (FLONASE) 50 MCG/ACT nasal spray   Need for pneumococcal vaccination       Relevant Orders   Pneumococcal polysaccharide vaccine 23-valent greater than or equal to 2yo subcutaneous/IM (Completed)   Flu vaccine need       Relevant Orders   Flu Vaccine QUAD 36+ mos PF IM (Fluarix & Fluzone Quad PF) (Completed)    I spent 25 minutes with this patient, More than 50% of that time was spent in face to face education, counseling and care coordination.    Dr. Otilio Miu [facility]  06/09/16

## 2016-06-10 LAB — LIPID PANEL
CHOL/HDL RATIO: 3.3 ratio (ref 0.0–4.4)
Cholesterol, Total: 230 mg/dL — ABNORMAL HIGH (ref 100–199)
HDL: 69 mg/dL (ref >39)
LDL Calculated: 132 mg/dL — ABNORMAL HIGH (ref 0–99)
Triglycerides: 147 mg/dL (ref 0–149)
VLDL CHOLESTEROL CAL: 29 mg/dL (ref 5–40)

## 2016-06-23 ENCOUNTER — Encounter: Payer: Self-pay | Admitting: Family Medicine

## 2016-06-23 ENCOUNTER — Ambulatory Visit (INDEPENDENT_AMBULATORY_CARE_PROVIDER_SITE_OTHER): Payer: Medicare Other | Admitting: Family Medicine

## 2016-06-23 VITALS — BP 120/70 | HR 70 | Ht 63.0 in | Wt 138.0 lb

## 2016-06-23 DIAGNOSIS — J0100 Acute maxillary sinusitis, unspecified: Secondary | ICD-10-CM

## 2016-06-23 MED ORDER — PREDNISONE 10 MG PO TABS: 10.0000 mg | ORAL_TABLET | Freq: Every day | ORAL | 1 refills | Status: DC

## 2016-06-23 MED ORDER — AMOXICILLIN 500 MG PO CAPS: 500.0000 mg | ORAL_CAPSULE | Freq: Three times a day (TID) | ORAL | 1 refills | Status: DC

## 2016-06-23 NOTE — Progress Notes (Signed)
Name: Marie Villa   MRN: [id number]    DOB: 1941-09-07   Date:06/23/2016       Progress Note  Subjective  Chief Complaint  Chief Complaint  Patient presents with  . Sinusitis    drainage, yellow production, nasal cong    Sinusitis  This is a new problem. The current episode started in the past 7 days. The problem has been gradually worsening since onset. There has been no fever. Her pain is at a severity of 4/10. The pain is mild. Associated symptoms include congestion and sinus pressure. Pertinent negatives include no chills, coughing, diaphoresis, ear pain, headaches, hoarse voice, neck pain, shortness of breath, sneezing, sore throat or swollen glands. Past treatments include oral decongestants (mucinex). The treatment provided no relief.    No problem-specific Assessment & Plan notes found for this encounter.   Past Medical History:  Diagnosis Date  . Congestive heart disease ([place])   . COPD (chronic obstructive pulmonary disease) ([place])   . GERD (gastroesophageal reflux disease)   . Hyperlipidemia     Past Surgical History:  Procedure Laterality Date  . COLONOSCOPY  2012   normal- cleared for 10- Iftikhar  . VAGINAL HYSTERECTOMY      History reviewed. No pertinent family history.  Social History   Social History  . Marital status: Married    Spouse name: N/A  . Number of children: N/A  . Years of education: N/A   Occupational History  . Not on file.   Social History Main Topics  . Smoking status: Former Research scientist (life sciences)  . Smokeless tobacco: Not on file  . Alcohol use No  . Drug use: No  . Sexual activity: Yes   Other Topics Concern  . Not on file   Social History Narrative  . No narrative on file    Allergies  Allergen Reactions  . Sulfa Antibiotics      Review of Systems  Constitutional: Negative for chills, diaphoresis, fever, malaise/fatigue and weight loss.  HENT: Positive for congestion and sinus pressure. Negative for ear discharge, ear pain,  hoarse voice, sneezing and sore throat.   Eyes: Negative for blurred vision.  Respiratory: Negative for cough, sputum production, shortness of breath and wheezing.   Cardiovascular: Negative for chest pain, palpitations and leg swelling.  Gastrointestinal: Negative for abdominal pain, blood in stool, constipation, diarrhea, heartburn, melena and nausea.  Genitourinary: Negative for dysuria, frequency, hematuria and urgency.  Musculoskeletal: Negative for back pain, joint pain, myalgias and neck pain.  Skin: Negative for rash.  Neurological: Negative for dizziness, tingling, sensory change, focal weakness and headaches.  Endo/Heme/Allergies: Negative for environmental allergies and polydipsia. Does not bruise/bleed easily.  Psychiatric/Behavioral: Negative for depression and suicidal ideas. The patient is not nervous/anxious and does not have insomnia.      Objective  Vitals:   06/23/16 1105  BP: 120/70  Pulse: 70  Weight: 138 lb (62.6 kg)  Height: 5\' 3"  (1.6 m)    Physical Exam  Constitutional: She is well-developed, well-nourished, and in no distress. No distress.  HENT:  Head: Normocephalic and atraumatic.  Right Ear: External ear normal.  Left Ear: External ear normal.  Nose: Nose normal.  Mouth/Throat: Oropharynx is clear and moist.  Eyes: Conjunctivae and EOM are normal. Pupils are equal, round, and reactive to light. Right eye exhibits no discharge. Left eye exhibits no discharge.  Neck: Normal range of motion. Neck supple. No JVD present. No thyromegaly present.  Cardiovascular: Normal rate, regular rhythm, normal heart sounds  and intact distal pulses.  Exam reveals no gallop and no friction rub.   No murmur heard. Pulmonary/Chest: Effort normal and breath sounds normal.  Abdominal: Soft. Bowel sounds are normal. She exhibits no mass. There is no tenderness. There is no guarding.  Musculoskeletal: Normal range of motion. She exhibits no edema.  Lymphadenopathy:    She  has no cervical adenopathy.  Neurological: She is alert. She has normal reflexes.  Skin: Skin is warm and dry. She is not diaphoretic.  Psychiatric: Mood and affect normal.  Nursing note and vitals reviewed.     Assessment & Plan  Problem List Items Addressed This Visit    None    Visit Diagnoses    Acute non-recurrent maxillary sinusitis    -  Primary   Relevant Medications   amoxicillin (AMOXIL) 500 MG capsule   predniSONE (DELTASONE) 10 MG tablet        Dr. Mac[facility]  06/23/16

## 2016-07-10 ENCOUNTER — Other Ambulatory Visit: Payer: Self-pay | Admitting: Family Medicine

## 2016-07-10 DIAGNOSIS — J302 Other seasonal allergic rhinitis: Secondary | ICD-10-CM

## 2016-09-14 ENCOUNTER — Ambulatory Visit (INDEPENDENT_AMBULATORY_CARE_PROVIDER_SITE_OTHER): Payer: Medicare Other

## 2016-09-14 ENCOUNTER — Ambulatory Visit
Admission: EM | Admit: 2016-09-14 | Discharge: 2016-09-14 | Disposition: A | Discharge status: Home / Self Care | Payer: Medicare Other | Attending: Emergency Medicine | Admitting: Emergency Medicine

## 2016-09-14 ENCOUNTER — Encounter: Payer: Self-pay | Admitting: Emergency Medicine

## 2016-09-14 DIAGNOSIS — K219 Gastro-esophageal reflux disease without esophagitis: Secondary | ICD-10-CM | POA: Diagnosis not present

## 2016-09-14 DIAGNOSIS — J449 Chronic obstructive pulmonary disease, unspecified: Secondary | ICD-10-CM | POA: Insufficient documentation

## 2016-09-14 DIAGNOSIS — E785 Hyperlipidemia, unspecified: Secondary | ICD-10-CM | POA: Diagnosis not present

## 2016-09-14 DIAGNOSIS — Z882 Allergy status to sulfonamides status: Secondary | ICD-10-CM | POA: Diagnosis not present

## 2016-09-14 DIAGNOSIS — Z87891 Personal history of nicotine dependence: Secondary | ICD-10-CM | POA: Insufficient documentation

## 2016-09-14 DIAGNOSIS — Z79899 Other long term (current) drug therapy: Secondary | ICD-10-CM | POA: Diagnosis not present

## 2016-09-14 DIAGNOSIS — R1013 Epigastric pain: Secondary | ICD-10-CM

## 2016-09-14 DIAGNOSIS — R1010 Upper abdominal pain, unspecified: Secondary | ICD-10-CM | POA: Diagnosis present

## 2016-09-14 DIAGNOSIS — I509 Heart failure, unspecified: Secondary | ICD-10-CM | POA: Diagnosis not present

## 2016-09-14 LAB — RAPID INFLUENZA A&B ANTIGENS
Influenza A (ARMC): NEGATIVE
Influenza B (ARMC): NEGATIVE

## 2016-09-14 NOTE — ED Triage Notes (Signed)
Patient also reports indigestion, beltching and passing gas since Wed.

## 2016-09-14 NOTE — ED Triage Notes (Signed)
Patient c/o chills that started this morning.  Patient denies cold symptoms at this time. Patient denies chest pain or SOB.

## 2016-09-14 NOTE — ED Provider Notes (Signed)
HPI  SUBJECTIVE:  Marie Villa is a 76 y.o. female who presents with constant upper abdominal pain that she describes as soreness, belching, and increased passing gas starting today. She had similar symptoms 3 days ago, but they resolved. They returned this morning at 0800. She tried Gas-X, Zantac with improvement in her symptoms. She also states walking around makes her feel better. She has tried ibuprofen 400 mg. There are no aggravating factors. Her symptoms are not associated eating, movement, inspiration, or position. She denies radiation of this pain up her neck, down her arm or through to her back. She denies chest pain, pressure, heaviness, shortness of breath, coughing, wheezing, palpitations, syncope. No urinary complaints. No back pain. Had a normal bowel movement this morning. She reports abdominal distention when gassy. No melena, hematochezia. She denies nausea, vomiting, fevers. She did have episode of chills today and 3 days ago. No antipyretic in the past 6-8 hours. States that she did have dairy before the symptoms started, she has no known dairy or lactose intolerance. No change in medications, no alcohol last night. She had similar symptoms previously but did not see a doctor for this. She has a past medical history of a hiatal hernia, GERD for which she takes omeprazole, CHF, hypercholesterolemia, COPD, chronic kidney disease, chronic bronchitis. She is status post vaginal hysterectomy. She's had no abdominal surgeries. No MI, hypertension, diabetes, peptic ulcer disease. PMD: Dr. Ronnald Ramp.   Past Medical History:  Diagnosis Date  . Congestive heart disease ([place])   . COPD (chronic obstructive pulmonary disease) ([place])   . GERD (gastroesophageal reflux disease)   . Hyperlipidemia     Past Surgical History:  Procedure Laterality Date  . COLONOSCOPY  2012   normal- cleared for 10- Iftikhar  . VAGINAL HYSTERECTOMY      History reviewed. No pertinent family history.  Social  History  Substance Use Topics  . Smoking status: Former Research scientist (life sciences)  . Smokeless tobacco: Never Used  . Alcohol use No    No current facility-administered medications for this encounter.   Current Outpatient Prescriptions:  .  atorvastatin (LIPITOR) 20 MG tablet, Take 1 tablet (20 mg total) by mouth daily., Disp: 90 tablet, Rfl: 1 .  carvedilol (COREG) 3.125 MG tablet, Take 1 tablet by mouth 2 (two) times daily. [place], Disp: , Rfl:  .  fluticasone (FLONASE) 50 MCG/ACT nasal spray, Place 2 sprays into both nostrils daily., Disp: 16 g, Rfl: 0 .  Fluticasone-Salmeterol (ADVAIR) 250-50 MCG/DOSE AEPB, Inhale 1 puff into the lungs 2 (two) times daily., Disp: 3 each, Rfl: 1 .  omeprazole (PRILOSEC) 20 MG capsule, Take 1 capsule (20 mg total) by mouth daily. [place], Disp: 90 capsule, Rfl: 1 .  ramipril (ALTACE) 2.5 MG capsule, Take 1 capsule by mouth daily. Dr Clayborn Bigness, Disp: , Rfl:   Allergies  Allergen Reactions  . Sulfa Antibiotics      ROS  As noted in HPI.   Physical Exam  BP (!) 127/58 (BP Location: Right Arm)   Pulse 100   Temp 97.9 F (36.6 C) (Oral)   Resp 18   Ht 5\' 5"  (1.651 m)   Wt 135 lb (61.2 kg)   SpO2 93%   BMI 22.47 kg/m    Constitutional: Well developed, well nourished, no acute distress Eyes: PERRL, EOMI, conjunctiva normal bilaterally HENT: Normocephalic, atraumatic,mucus membranes moist Respiratory: Clear to auscultation bilaterally, no rales, no wheezing, no rhonchi Cardiovascular: Regular mild tachycardia, no murmurs, no gallops, no rubs GI: Positive mild  left upper quadrant and epigastric enderness, normal appearance, Soft, nondistended, normal bowel sounds, otherwise nontender, no rebound, no guarding, no masses.  Back: no CVAT skin: No rash, skin intact Musculoskeletal: No edema, no tenderness, no deformities Neurologic: Alert & oriented x 3, CN II-XII grossly intact, no motor deficits, sensation grossly intact Psychiatric: Speech and behavior  appropriate  ED Course   Medications - No data to display  Orders Placed This Encounter  Procedures  . Rapid Influenza A&B Antigens ([facility] only)    Standing Status:   Standing    Number of Occurrences:   1  . DG Chest 2 View    Standing Status:   Standing    Number of Occurrences:   1    Order Specific Question:   Reason for Exam (SYMPTOM  OR DIAGNOSIS REQUIRED)    Answer:   upper abd pain  . DG Abd 2 Views    Standing Status:   Standing    Number of Occurrences:   1    Order Specific Question:   Reason for Exam (SYMPTOM  OR DIAGNOSIS REQUIRED)    Answer:   upper abd pain  . Droplet precaution    Standing Status:   Standing    Number of Occurrences:   1  . ED EKG    Abdominal pain    Standing Status:   Standing    Number of Occurrences:   1    Order Specific Question:   Reason for Exam    Answer:   Other (See Comments)  . EKG 12-Lead    Standing Status:   Standing    Number of Occurrences:   1   Results for orders placed or performed during the hospital encounter of 09/14/16 (from the past 24 hour(s))  Rapid Influenza A&B Antigens ([place] only)     Status: None   Collection Time: 09/14/16 12:53 PM  Result Value Ref Range   Influenza A ([facility]) NEGATIVE NEGATIVE   Influenza B [facility]) NEGATIVE NEGATIVE   Dg Chest 2 View  Result Date: 09/14/2016 CLINICAL DATA:  Indigestion and belching EXAM: CHEST  2 VIEW COMPARISON:  July 22, 2010 FINDINGS: There is a moderate to large hiatal hernia. No pneumothorax. No pulmonary nodules or masses. Mild opacity in left retrocardiac region is likely atelectasis. No suspicious infiltrate. No change in the cardiomediastinal silhouette. IMPRESSION: No active cardiopulmonary disease. Electronically Signed   By: Dorise Bullion III M.D   On: 09/14/2016 13:30   Dg Abd 2 Views  Result Date: 09/14/2016 CLINICAL DATA:  Indigestion and belching. EXAM: ABDOMEN - 2 VIEW COMPARISON:  None FINDINGS: Moderate hiatal hernia. Mild fecal loading in the  descending colon. No bowel obstruction. No other acute abnormalities seen in the abdomen or pelvis. IMPRESSION: Mild fecal loading in the descending colon. No other acute abnormalities. Electronically Signed   By: Dorise Bullion III M.D   On: 09/14/2016 13:31    ED Clinical Impression  Dyspepsia  Epigastric pain   ED Assessment/Plan  In the differential is atypical chest pain, peptic ulcer disease, gastritis, viscous perforation. Also given the fact that she is reporting chills, she may have a very subtle cases of influenza, so we'll check a rapid fluid in addition to an EKG, chest x-ray and two-view abdomen. Doubt cholecystitis, pancreatitis, UTI, diverticulitis, colitis. She has not had any nausea or vomiting or any reason for her to be dehydrated so labs were not done. No urinary complaints. Did not check a urine.  EKG:  Sinus tachycardia, rate 108. Left axis deviation. Normal intervals. No hypertrophy. No ST-T wave changes consistent with ischemia. No previous EKG for comparison.  Imaging independently and reviewed radiology reports. Chest x-ray: Moderate to large hiatal hernia and pneumothorax no bony nodules or masses. Mild opacity in left retrocardiac region likely atelectasissuspicious infiltrate. No change in the cardiomediastinal silhouette.  Abdomen:  moderate hiatal hernia, mild fecal loading the descending colon no obstruction. See radiology report for details  She has mild tachycardia, but she is afebrile. She has not taken antipyretic in the past 6 or 8 hours. Blood pressure is reassuring. She appears otherwise nontoxic. No evidence of a surgical abdomen at this time. Her EKG ,  imaging is reassuring.   Presentation is suggestive of a gastritis with the abdominal tenderness and dyspepsia based on history. We'll have her continue Gas-X, Zantac, continue her omeprazole, will recommend Tylenol instead of ibuprofen, bland diet for the next several days. No dairy. Repeat abdominal  exam she still has some epigastric tenderness, but no left upper quadrant tenderness or any other abdominal tenderness. There is still no guarding or rebound. She will need to follow-up with her primary care physician tomorrow. Giving patient very strict ER return precautions.  Discussed imaging, MDM, plan and followup with patient. Discussed sn/sx that should prompt return to the ED. Patient agrees with plan.   No orders of the defined types were placed in this encounter.   *This clinic note was created using Dragon dictation software. Therefore, there may be occasional mistakes despite careful proofreading.  ?   Melynda Ripple, MD 09/14/16 1726

## 2016-10-08 ENCOUNTER — Other Ambulatory Visit: Payer: Self-pay | Admitting: Family Medicine

## 2016-10-08 DIAGNOSIS — J302 Other seasonal allergic rhinitis: Secondary | ICD-10-CM

## 2016-12-01 ENCOUNTER — Other Ambulatory Visit: Payer: Self-pay | Admitting: Family Medicine

## 2016-12-01 DIAGNOSIS — J302 Other seasonal allergic rhinitis: Secondary | ICD-10-CM

## 2016-12-08 ENCOUNTER — Encounter: Payer: Self-pay | Admitting: Family Medicine

## 2016-12-08 ENCOUNTER — Ambulatory Visit (INDEPENDENT_AMBULATORY_CARE_PROVIDER_SITE_OTHER): Payer: Medicare Other | Admitting: Family Medicine

## 2016-12-08 VITALS — BP 100/70 | HR 80 | Ht 65.0 in | Wt 137.0 lb

## 2016-12-08 DIAGNOSIS — J441 Chronic obstructive pulmonary disease with (acute) exacerbation: Secondary | ICD-10-CM

## 2016-12-08 DIAGNOSIS — K219 Gastro-esophageal reflux disease without esophagitis: Secondary | ICD-10-CM | POA: Diagnosis not present

## 2016-12-08 DIAGNOSIS — J302 Other seasonal allergic rhinitis: Secondary | ICD-10-CM

## 2016-12-08 DIAGNOSIS — E7800 Pure hypercholesterolemia, unspecified: Secondary | ICD-10-CM

## 2016-12-08 MED ORDER — ATORVASTATIN CALCIUM 20 MG PO TABS: 20.0000 mg | ORAL_TABLET | Freq: Every day | ORAL | 1 refills | Status: DC

## 2016-12-08 MED ORDER — FLUTICASONE PROPIONATE 50 MCG/ACT NA SUSP: 2.0000 | Freq: Every day | NASAL | 11 refills | Status: DC

## 2016-12-08 MED ORDER — OMEPRAZOLE 20 MG PO CPDR: 20.0000 mg | DELAYED_RELEASE_CAPSULE | Freq: Every day | ORAL | 1 refills | Status: DC

## 2016-12-08 MED ORDER — FLUTICASONE-SALMETEROL 250-50 MCG/DOSE IN AEPB: 1.0000 | INHALATION_SPRAY | Freq: Two times a day (BID) | RESPIRATORY_TRACT | 1 refills | Status: DC

## 2016-12-08 NOTE — Progress Notes (Signed)
Name: Marie Villa   MRN: [id number]    DOB: 04/25/1941   Date:12/08/2016       Progress Note  Subjective  Chief Complaint  Chief Complaint  Patient presents with  . Hyperlipidemia  . Allergic Rhinitis   . Gastroesophageal Reflux  . COPD    Hyperlipidemia  This is a chronic problem. The current episode started more than 1 year ago. The problem is controlled. Recent lipid tests were reviewed and are normal. She has no history of chronic renal disease, diabetes, hypothyroidism, liver disease, obesity or nephrotic syndrome. There are no known factors aggravating her hyperlipidemia. Associated symptoms include shortness of breath. Pertinent negatives include no chest pain, focal sensory loss, focal weakness, leg pain or myalgias. Current antihyperlipidemic treatment includes statins. The current treatment provides moderate improvement of lipids. Risk factors for coronary artery disease include dyslipidemia and hypertension.  Gastroesophageal Reflux  She complains of belching and heartburn. She reports no abdominal pain, no chest pain, no coughing, no dysphagia, no nausea, no sore throat or no wheezing. This is a chronic problem. The current episode started more than 1 year ago. The problem has been waxing and waning. The heartburn is of mild intensity. The symptoms are aggravated by certain foods (spicy). Pertinent negatives include no melena or weight loss. She has tried a PPI for the symptoms. The treatment provided moderate relief.  Sinus Problem  This is a chronic problem. The problem has been waxing and waning since onset. There has been no fever. Associated symptoms include headaches, shortness of breath, sinus pressure and sneezing. Pertinent negatives include no chills, congestion, coughing, ear pain, neck pain or sore throat.  Shortness of Breath  This is a chronic problem. The problem occurs daily. The problem has been waxing and waning. Associated symptoms include headaches. Pertinent  negatives include no abdominal pain, chest pain, ear pain, fever, leg pain, leg swelling, neck pain, rash, sore throat, sputum production or wheezing. The symptoms are aggravated by pollens and weather changes. She has tried beta agonist inhalers and steroid inhalers for the symptoms. The treatment provided moderate relief.    No problem-specific Assessment & Plan notes found for this encounter.   Past Medical History:  Diagnosis Date  . Congestive heart disease ([place])   . COPD (chronic obstructive pulmonary disease) ([place])   . GERD (gastroesophageal reflux disease)   . Hyperlipidemia     Past Surgical History:  Procedure Laterality Date  . COLONOSCOPY  2012   normal- cleared for 10- Iftikhar  . VAGINAL HYSTERECTOMY      No family history on file.  Social History   Social History  . Marital status: Married    Spouse name: N/A  . Number of children: N/A  . Years of education: N/A   Occupational History  . Not on file.   Social History Main Topics  . Smoking status: Former Research scientist (life sciences)  . Smokeless tobacco: Never Used  . Alcohol use No  . Drug use: No  . Sexual activity: Yes   Other Topics Concern  . Not on file   Social History Narrative  . No narrative on file    Allergies  Allergen Reactions  . Sulfa Antibiotics     Outpatient Medications Prior to Visit  Medication Sig Dispense Refill  . carvedilol (COREG) 3.125 MG tablet Take 1 tablet by mouth 2 (two) times daily. [place]    . ramipril (ALTACE) 2.5 MG capsule Take 1 capsule by mouth daily. Dr Clayborn Bigness    .  atorvastatin (LIPITOR) 20 MG tablet Take 1 tablet (20 mg total) by mouth daily. 90 tablet 1  . fluticasone (FLONASE) 50 MCG/ACT nasal spray Place 2 sprays into both nostrils daily. 16 g 0  . Fluticasone-Salmeterol (ADVAIR) 250-50 MCG/DOSE AEPB Inhale 1 puff into the lungs 2 (two) times daily. 3 each 1  . omeprazole (PRILOSEC) 20 MG capsule Take 1 capsule (20 mg total) by mouth daily. [place] 90 capsule 1  .  fluticasone (FLONASE) 50 MCG/ACT nasal spray SHAKE LIQUID AND USE 2 SPRAYS IN EACH NOSTRIL DAILY 16 g 0   No facility-administered medications prior to visit.     Review of Systems  Constitutional: Negative for chills, fever, malaise/fatigue and weight loss.  HENT: Positive for sinus pressure and sneezing. Negative for congestion, ear discharge, ear pain and sore throat.   Eyes: Negative for blurred vision.  Respiratory: Positive for shortness of breath. Negative for cough, sputum production and wheezing.   Cardiovascular: Negative for chest pain, palpitations and leg swelling.  Gastrointestinal: Positive for heartburn. Negative for abdominal pain, blood in stool, constipation, diarrhea, dysphagia, melena and nausea.  Genitourinary: Negative for dysuria, frequency, hematuria and urgency.  Musculoskeletal: Negative for back pain, joint pain, myalgias and neck pain.  Skin: Negative for rash.  Neurological: Positive for headaches. Negative for dizziness, tingling, sensory change and focal weakness.  Endo/Heme/Allergies: Negative for environmental allergies and polydipsia. Does not bruise/bleed easily.  Psychiatric/Behavioral: Negative for depression and suicidal ideas. The patient is not nervous/anxious and does not have insomnia.      Objective  Vitals:   12/08/16 0808  BP: 100/70  Pulse: 80  Weight: 137 lb (62.1 kg)  Height: 5\' 5"  (1.651 m)    Physical Exam  Constitutional: She is well-developed, well-nourished, and in no distress. No distress.  HENT:  Head: Normocephalic and atraumatic.  Right Ear: External ear normal.  Left Ear: External ear normal.  Nose: Nose normal.  Mouth/Throat: Oropharynx is clear and moist.  Eyes: Conjunctivae and EOM are normal. Pupils are equal, round, and reactive to light. Right eye exhibits no discharge. Left eye exhibits no discharge.  Neck: Normal range of motion. Neck supple. No JVD present. No thyromegaly present.  Cardiovascular: Normal  rate, regular rhythm, normal heart sounds and intact distal pulses.  Exam reveals no gallop and no friction rub.   No murmur heard. Pulmonary/Chest: Effort normal and breath sounds normal. She has no wheezes. She has no rales.  Abdominal: Soft. Bowel sounds are normal. She exhibits no mass. There is no tenderness. There is no guarding.  Musculoskeletal: Normal range of motion. She exhibits no edema.  Lymphadenopathy:    She has no cervical adenopathy.  Neurological: She is alert. She has normal reflexes.  Skin: Skin is warm and dry. She is not diaphoretic.  Psychiatric: Mood and affect normal.  Nursing note and vitals reviewed.     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 Fluticasone-Salmeterol (ADVAIR) 250-50 MCG/DOSE AEPB    Sig: Inhale 1 puff into the lungs 2 (two) times daily.    Dispense:  3 each    Refill:  1  . omeprazole (PRILOSEC) 20 MG capsule    Sig: Take 1 capsule (20 mg total) by mouth daily. [place]    Dispense:  90 capsule    Refill:  1  . atorvastatin (LIPITOR) 20 MG tablet    Sig: Take 1 tablet (20 mg total) by mouth daily.    Dispense:  90 tablet    Refill:  1  . fluticasone (FLONASE) 50 MCG/ACT nasal spray    Sig: Place 2 sprays into both nostrils daily.    Dispense:  16 g    Refill:  11    Call and sched appt      Dr. Otilio Miu [facility]  12/08/16

## 2016-12-09 LAB — LIPID PANEL
CHOLESTEROL TOTAL: 196 mg/dL (ref 100–199)
Chol/HDL Ratio: 2.7 ratio (ref 0.0–4.4)
HDL: 72 mg/dL (ref >39)
LDL Calculated: 90 mg/dL (ref 0–99)
Triglycerides: 169 mg/dL — ABNORMAL HIGH (ref 0–149)
VLDL CHOLESTEROL CAL: 34 mg/dL (ref 5–40)

## 2016-12-26 ENCOUNTER — Other Ambulatory Visit: Payer: Self-pay | Admitting: Family Medicine

## 2016-12-26 DIAGNOSIS — J302 Other seasonal allergic rhinitis: Secondary | ICD-10-CM

## 2016-12-30 ENCOUNTER — Ambulatory Visit (INDEPENDENT_AMBULATORY_CARE_PROVIDER_SITE_OTHER): Payer: Medicare Other | Admitting: Family Medicine

## 2016-12-30 ENCOUNTER — Encounter: Payer: Self-pay | Admitting: Family Medicine

## 2016-12-30 ENCOUNTER — Other Ambulatory Visit: Payer: Self-pay | Admitting: Family Medicine

## 2016-12-30 VITALS — BP 100/62 | HR 70 | Temp 98.1°F | Ht 65.0 in | Wt 137.0 lb

## 2016-12-30 DIAGNOSIS — K219 Gastro-esophageal reflux disease without esophagitis: Secondary | ICD-10-CM

## 2016-12-30 DIAGNOSIS — L509 Urticaria, unspecified: Secondary | ICD-10-CM | POA: Diagnosis not present

## 2016-12-30 MED ORDER — PREDNISONE 10 MG PO TABS: ORAL_TABLET | ORAL | 1 refills | Status: DC

## 2016-12-30 MED ORDER — RANITIDINE HCL 150 MG PO TABS: 150.0000 mg | ORAL_TABLET | Freq: Every day | ORAL | 1 refills | Status: DC

## 2016-12-30 MED ORDER — HYDROXYZINE HCL 10 MG PO TABS: 10.0000 mg | ORAL_TABLET | Freq: Three times a day (TID) | ORAL | 0 refills | Status: DC | PRN

## 2016-12-30 NOTE — Progress Notes (Signed)
Name: Marie Villa   MRN: [id number]    DOB: September 10, 1940   Date:12/30/2016       Progress Note  Subjective  Chief Complaint  Chief Complaint  Patient presents with  . Rash    broke out in rash on belly, back, legs before lunch yesterday- did eat some barbeque sauce on Sunday but nothing else has been noted    Rash  This is a new problem. The current episode started yesterday. The problem has been gradually worsening since onset. The rash is diffuse. The rash is characterized by itchiness and redness. She was exposed to nothing. Pertinent negatives include no anorexia, congestion, cough, diarrhea, eye pain, facial edema, fatigue, fever, joint pain, nail changes, rhinorrhea, shortness of breath, sore throat or vomiting. Past treatments include antihistamine. The treatment provided moderate relief.    No problem-specific Assessment & Plan notes found for this encounter.   Past Medical History:  Diagnosis Date  . Congestive heart disease ([place])   . COPD (chronic obstructive pulmonary disease) ([place])   . GERD (gastroesophageal reflux disease)   . Hyperlipidemia     Past Surgical History:  Procedure Laterality Date  . COLONOSCOPY  2012   normal- cleared for 10- Iftikhar  . VAGINAL HYSTERECTOMY      No family history on file.  Social History   Social History  . Marital status: Married    Spouse name: N/A  . Number of children: N/A  . Years of education: N/A   Occupational History  . Not on file.   Social History Main Topics  . Smoking status: Former Research scientist (life sciences)  . Smokeless tobacco: Never Used  . Alcohol use No  . Drug use: No  . Sexual activity: Yes   Other Topics Concern  . Not on file   Social History Narrative  . No narrative on file    Allergies  Allergen Reactions  . Sulfa Antibiotics     Outpatient Medications Prior to Visit  Medication Sig Dispense Refill  . atorvastatin (LIPITOR) 20 MG tablet Take 1 tablet (20 mg total) by mouth daily. 90 tablet 1  .  carvedilol (COREG) 3.125 MG tablet Take 1 tablet by mouth 2 (two) times daily. [place]    . fluticasone (FLONASE) 50 MCG/ACT nasal spray SHAKE LIQUID AND USE 2 SPRAYS IN EACH NOSTRIL DAILY 16 g 2  . Fluticasone-Salmeterol (ADVAIR) 250-50 MCG/DOSE AEPB Inhale 1 puff into the lungs 2 (two) times daily. 3 each 1  . omeprazole (PRILOSEC) 20 MG capsule Take 1 capsule (20 mg total) by mouth daily. [place] 90 capsule 1  . ramipril (ALTACE) 2.5 MG capsule Take 1 capsule by mouth daily. Dr Clayborn Bigness    . fluticasone (FLONASE) 50 MCG/ACT nasal spray Place 2 sprays into both nostrils daily. 16 g 11   No facility-administered medications prior to visit.     Review of Systems  Constitutional: Negative for chills, fatigue, fever, malaise/fatigue and weight loss.  HENT: Negative for congestion, ear discharge, ear pain, rhinorrhea and sore throat.   Eyes: Negative for blurred vision and pain.  Respiratory: Negative for cough, sputum production, shortness of breath and wheezing.   Cardiovascular: Negative for chest pain, palpitations and leg swelling.  Gastrointestinal: Negative for abdominal pain, anorexia, blood in stool, constipation, diarrhea, heartburn, melena, nausea and vomiting.  Genitourinary: Negative for dysuria, frequency, hematuria and urgency.  Musculoskeletal: Negative for back pain, joint pain, myalgias and neck pain.  Skin: Positive for rash. Negative for nail changes.  Neurological: Negative for  dizziness, tingling, sensory change, focal weakness and headaches.  Endo/Heme/Allergies: Negative for environmental allergies and polydipsia. Does not bruise/bleed easily.  Psychiatric/Behavioral: Negative for depression and suicidal ideas. The patient is not nervous/anxious and does not have insomnia.      Objective  Vitals:   12/30/16 1121  BP: 100/62  Pulse: 70  Temp: 98.1 F (36.7 C)  TempSrc: Oral  Weight: 137 lb (62.1 kg)  Height: 5\' 5"  (1.651 m)    Physical Exam   Constitutional: She is well-developed, well-nourished, and in no distress. No distress.  HENT:  Head: Normocephalic and atraumatic.  Right Ear: External ear normal.  Left Ear: External ear normal.  Nose: Nose normal.  Mouth/Throat: Oropharynx is clear and moist.  Eyes: Conjunctivae and EOM are normal. Pupils are equal, round, and reactive to light. Right eye exhibits no discharge. Left eye exhibits no discharge.  Neck: Normal range of motion. Neck supple. No JVD present. No thyromegaly present.  Cardiovascular: Normal rate, regular rhythm, normal heart sounds and intact distal pulses.  Exam reveals no gallop and no friction rub.   No murmur heard. Pulmonary/Chest: Effort normal and breath sounds normal.  Abdominal: Soft. Bowel sounds are normal. She exhibits no mass. There is no tenderness. There is no guarding.  Musculoskeletal: Normal range of motion. She exhibits no edema.  Lymphadenopathy:    She has no cervical adenopathy.  Neurological: She is alert.  Skin: Skin is warm and dry. Rash noted. Rash is macular. She is not diaphoretic. There is erythema.  Psychiatric: Mood and affect normal.      Assessment & Plan  Problem List Items Addressed This Visit      Digestive   Acid reflux   Relevant Medications   ranitidine (ZANTAC) 150 MG tablet    Other Visit Diagnoses    Urticaria    -  Primary   Relevant Medications   hydrOXYzine (ATARAX/VISTARIL) 10 MG tablet   predniSONE (DELTASONE) 10 MG tablet      Meds ordered this encounter  Medications  . hydrOXYzine (ATARAX/VISTARIL) 10 MG tablet    Sig: Take 1 tablet (10 mg total) by mouth 3 (three) times daily as needed.    Dispense:  30 tablet    Refill:  0  . predniSONE (DELTASONE) 10 MG tablet    Sig: Taper 6,6,6,5,5,5,4,4,3,3,2,2,1,1    Dispense:  53 tablet    Refill:  1  . ranitidine (ZANTAC) 150 MG tablet    Sig: Take 1 tablet (150 mg total) by mouth at bedtime.    Dispense:  30 tablet    Refill:  1      Dr.  Ab [facility]  12/30/16

## 2017-01-23 ENCOUNTER — Other Ambulatory Visit: Payer: Self-pay | Admitting: Family Medicine

## 2017-01-23 DIAGNOSIS — J441 Chronic obstructive pulmonary disease with (acute) exacerbation: Secondary | ICD-10-CM

## 2017-04-10 ENCOUNTER — Encounter: Payer: Self-pay | Admitting: Family Medicine

## 2017-04-10 ENCOUNTER — Ambulatory Visit (INDEPENDENT_AMBULATORY_CARE_PROVIDER_SITE_OTHER): Payer: Medicare Other | Admitting: Family Medicine

## 2017-04-10 VITALS — BP 112/78 | HR 82 | Ht 65.0 in | Wt 138.0 lb

## 2017-04-10 DIAGNOSIS — Z1231 Encounter for screening mammogram for malignant neoplasm of breast: Secondary | ICD-10-CM | POA: Diagnosis not present

## 2017-04-10 DIAGNOSIS — J441 Chronic obstructive pulmonary disease with (acute) exacerbation: Secondary | ICD-10-CM

## 2017-04-10 DIAGNOSIS — N3090 Cystitis, unspecified without hematuria: Secondary | ICD-10-CM | POA: Diagnosis not present

## 2017-04-10 DIAGNOSIS — R928 Other abnormal and inconclusive findings on diagnostic imaging of breast: Secondary | ICD-10-CM

## 2017-04-10 DIAGNOSIS — J302 Other seasonal allergic rhinitis: Secondary | ICD-10-CM | POA: Diagnosis not present

## 2017-04-10 DIAGNOSIS — Z0000 Encounter for general adult medical examination without abnormal findings: Secondary | ICD-10-CM

## 2017-04-10 DIAGNOSIS — K219 Gastro-esophageal reflux disease without esophagitis: Secondary | ICD-10-CM | POA: Diagnosis not present

## 2017-04-10 DIAGNOSIS — J449 Chronic obstructive pulmonary disease, unspecified: Secondary | ICD-10-CM | POA: Insufficient documentation

## 2017-04-10 DIAGNOSIS — E7800 Pure hypercholesterolemia, unspecified: Secondary | ICD-10-CM | POA: Diagnosis not present

## 2017-04-10 DIAGNOSIS — Z1239 Encounter for other screening for malignant neoplasm of breast: Secondary | ICD-10-CM

## 2017-04-10 LAB — POCT URINALYSIS DIPSTICK
Bilirubin, UA: NEGATIVE
Blood, UA: NEGATIVE
Glucose, UA: NEGATIVE
Ketones, UA: NEGATIVE
Nitrite, UA: NEGATIVE
PROTEIN UA: NEGATIVE
SPEC GRAV UA: 1.015 (ref 1.010–1.025)
UROBILINOGEN UA: 0.2 U/dL
pH, UA: 6 (ref 5.0–8.0)

## 2017-04-10 MED ORDER — ATORVASTATIN CALCIUM 20 MG PO TABS: 20.0000 mg | ORAL_TABLET | Freq: Every day | ORAL | 1 refills | Status: DC

## 2017-04-10 MED ORDER — FLUTICASONE-SALMETEROL 250-50 MCG/DOSE IN AEPB: INHALATION_SPRAY | RESPIRATORY_TRACT | 11 refills | Status: DC

## 2017-04-10 MED ORDER — RANITIDINE HCL 150 MG PO TABS: ORAL_TABLET | ORAL | 1 refills | Status: DC

## 2017-04-10 MED ORDER — FLUTICASONE PROPIONATE 50 MCG/ACT NA SUSP
NASAL | 11 refills | Status: DC
Start: 2017-04-10 — End: 2017-10-12

## 2017-04-10 MED ORDER — CIPROFLOXACIN HCL 250 MG PO TABS: 250.0000 mg | ORAL_TABLET | Freq: Two times a day (BID) | ORAL | 0 refills | Status: DC

## 2017-04-10 NOTE — Progress Notes (Signed)
Name: Marie Villa   MRN: [id number]    DOB: [birth year]   Date:04/10/2017       Progress Note  Subjective  Chief Complaint  Chief Complaint  Patient presents with  . Annual Exam  . Allergic Rhinitis     Needs refill flonase, and inhaler refilled.  . Gastroesophageal Reflux    Needs omeprazole   . Hyperlipidemia    Needs atorvastatin refilled.     Patient presents for annual physical exam.   Gastroesophageal Reflux  She reports no abdominal pain, no belching, no chest pain, no choking, no coughing, no dysphagia, no early satiety, no globus sensation, no heartburn, no hoarse voice, no nausea, no sore throat, no stridor, no tooth decay, no water brash or no wheezing. This is a chronic problem. The current episode started more than 1 year ago. The problem has been gradually improving. The symptoms are aggravated by certain foods. Pertinent negatives include no anemia, fatigue, melena, muscle weakness, orthopnea or weight loss. She has tried a histamine-2 antagonist for the symptoms. The treatment provided moderate relief.  Hyperlipidemia  This is a chronic problem. The current episode started more than 1 year ago. The problem is controlled. Recent lipid tests were reviewed and are normal. She has no history of chronic renal disease, diabetes, hypothyroidism, liver disease, obesity or nephrotic syndrome. Associated symptoms include shortness of breath. Pertinent negatives include no chest pain or leg pain. Current antihyperlipidemic treatment includes statins. The current treatment provides moderate improvement of lipids. There are no compliance problems.   Shortness of Breath  This is a chronic problem. The current episode started more than 1 year ago. The problem has been waxing and waning. Pertinent negatives include no abdominal pain, chest pain, claudication, coryza, ear pain, fever, headaches, hemoptysis, leg pain, leg swelling, neck pain, orthopnea, PND, rhinorrhea, sore throat, sputum  production, swollen glands, syncope, vomiting or wheezing. The symptoms are aggravated by weather changes. She has tried steroid inhalers and beta agonist inhalers for the symptoms. The treatment provided moderate relief. There is no history of allergies, aspirin allergies, asthma, bronchiolitis, CAD, chronic lung disease, COPD, DVT, a heart failure, PE, pneumonia or a recent surgery.  Sinus Problem  This is a chronic problem. The current episode started more than 1 year ago. The problem has been gradually improving since onset. There has been no fever. Associated symptoms include congestion, shortness of breath, sinus pressure and sneezing. Pertinent negatives include no chills, coughing, diaphoresis, ear pain, headaches, hoarse voice, neck pain, sore throat or swollen glands. Treatments tried: flonase. The treatment provided moderate relief.    No problem-specific Assessment & Plan notes found for this encounter.   Past Medical History:  Diagnosis Date  . Congestive heart disease ([place])   . COPD (chronic obstructive pulmonary disease) ([place])   . GERD (gastroesophageal reflux disease)   . Hyperlipidemia     Past Surgical History:  Procedure Laterality Date  . COLONOSCOPY  2012   normal- cleared for 10- Iftikhar  . VAGINAL HYSTERECTOMY      History reviewed. No pertinent family history.  Social History   Social History  . Marital status: Married    Spouse name: N/A  . Number of children: N/A  . Years of education: N/A   Occupational History  . Not on file.   Social History Main Topics  . Smoking status: Former Research scientist (life sciences)  . Smokeless tobacco: Never Used  . Alcohol use No  . Drug use: No  . Sexual activity: Yes  Other Topics Concern  . Not on file   Social History Narrative  . No narrative on file    Allergies  Allergen Reactions  . Sulfa Antibiotics     Outpatient Medications Prior to Visit  Medication Sig Dispense Refill  . carvedilol (COREG) 3.125 MG tablet Take  1 tablet by mouth 2 (two) times daily. [place]    . ramipril (ALTACE) 2.5 MG capsule Take 1 capsule by mouth daily. Dr Clayborn Bigness    . ADVAIR DISKUS 250-50 MCG/DOSE AEPB INHALE 1 PUFF INTO THE LUNGS TWICE DAILY 1 each 0  . atorvastatin (LIPITOR) 20 MG tablet Take 1 tablet (20 mg total) by mouth daily. 90 tablet 1  . fluticasone (FLONASE) 50 MCG/ACT nasal spray SHAKE LIQUID AND USE 2 SPRAYS IN EACH NOSTRIL DAILY 16 g 2  . omeprazole (PRILOSEC) 20 MG capsule Take 1 capsule (20 mg total) by mouth daily. [place] 90 capsule 1  . ranitidine (ZANTAC) 150 MG tablet TAKE 1 TABLET(150 MG) BY MOUTH AT BEDTIME 90 tablet 1  . hydrOXYzine (ATARAX/VISTARIL) 10 MG tablet Take 1 tablet (10 mg total) by mouth 3 (three) times daily as needed. (Patient not taking: Reported on 04/10/2017) 30 tablet 0  . predniSONE (DELTASONE) 10 MG tablet Taper 6,6,6,5,5,5,4,4,3,3,2,2,1,1 (Patient not taking: Reported on 04/10/2017) 53 tablet 1   No facility-administered medications prior to visit.     Review of Systems  Constitutional: Negative for chills, diaphoresis, fatigue, fever and weight loss.  HENT: Positive for congestion, sinus pressure and sneezing. Negative for ear pain, hoarse voice, rhinorrhea and sore throat.   Respiratory: Positive for shortness of breath. Negative for cough, hemoptysis, sputum production, choking and wheezing.   Cardiovascular: Negative for chest pain, orthopnea, claudication, leg swelling, syncope and PND.  Gastrointestinal: Negative for abdominal pain, dysphagia, heartburn, melena, nausea and vomiting.  Musculoskeletal: Negative for muscle weakness and neck pain.  Neurological: Negative for headaches.     Objective  Vitals:   04/10/17 0832  BP: 112/78  Pulse: 82  SpO2: 98%  Weight: 138 lb (62.6 kg)  Height: 5\' 5"  (1.651 m)    Physical Exam  Constitutional: She is oriented to person, place, and time and well-developed, well-nourished, and in no distress. Vital signs are normal. No  distress.  HENT:  Head: Normocephalic and atraumatic.  Right Ear: Tympanic membrane, external ear and ear canal normal.  Left Ear: Tympanic membrane, external ear and ear canal normal.  Nose: Nose normal.  Mouth/Throat: Uvula is midline, oropharynx is clear and moist and mucous membranes are normal.  Eyes: Pupils are equal, round, and reactive to light. Conjunctivae, EOM and lids are normal. Right eye exhibits no discharge. Left eye exhibits no discharge.  Neck: Trachea normal and normal range of motion. Neck supple. Normal carotid pulses, no hepatojugular reflux and no JVD present. Carotid bruit is not present. No thyromegaly present.  Cardiovascular: Normal rate, regular rhythm, S1 normal, S2 normal, normal heart sounds and intact distal pulses.  Exam reveals no gallop, no S3, no S4 and no friction rub.   No murmur heard. Pulmonary/Chest: Effort normal and breath sounds normal. No respiratory distress. She has no decreased breath sounds. She has no wheezes. She has no rhonchi. She has no rales. Right breast exhibits no inverted nipple, no mass, no nipple discharge, no skin change and no tenderness. Left breast exhibits no inverted nipple, no mass, no nipple discharge, no skin change and no tenderness. Breasts are symmetrical.  Abdominal: Soft. Normal aorta and bowel sounds are  normal. She exhibits no mass. There is no hepatosplenomegaly. There is no tenderness. There is no guarding and no CVA tenderness.  Musculoskeletal: Normal range of motion. She exhibits no edema.       Thoracic back: Normal.  Lymphadenopathy:       Head (right side): No submental and no submandibular adenopathy present.       Head (left side): No submental and no submandibular adenopathy present.    She has no cervical adenopathy.  Neurological: She is alert and oriented to person, place, and time. She has normal motor skills and normal reflexes.  Skin: Skin is warm and dry. She is not diaphoretic.  Psychiatric: Mood and  affect normal.      Assessment & Plan  Problem List Items Addressed This Visit      Respiratory   Other seasonal allergic rhinitis   Relevant Medications   fluticasone (FLONASE) 50 MCG/ACT nasal spray   Chronic obstructive pulmonary disease with acute exacerbation (HCC)   Relevant Medications   fluticasone (FLONASE) 50 MCG/ACT nasal spray   Fluticasone-Salmeterol (ADVAIR DISKUS) 250-50 MCG/DOSE AEPB     Digestive   Acid reflux   Relevant Medications   ranitidine (ZANTAC) 150 MG tablet     Other   Hypercholesteremia   Relevant Medications   atorvastatin (LIPITOR) 20 MG tablet    Other Visit Diagnoses    Annual physical exam    -  Primary   Relevant Orders   POCT Urinalysis Dipstick (Completed)   Cystitis       Relevant Medications   ciprofloxacin (CIPRO) 250 MG tablet   Breast cancer screening       hx abd mammogram   Relevant Orders   MM Digital Screening      Meds ordered this encounter  Medications  . fluticasone (FLONASE) 50 MCG/ACT nasal spray    Sig: SHAKE LIQUID AND USE 2 SPRAYS IN EACH NOSTRIL DAILY    Dispense:  16 g    Refill:  11  . atorvastatin (LIPITOR) 20 MG tablet    Sig: Take 1 tablet (20 mg total) by mouth daily.    Dispense:  90 tablet    Refill:  1  . ranitidine (ZANTAC) 150 MG tablet    Sig: TAKE 1 TABLET(150 MG) BY MOUTH AT BEDTIME    Dispense:  90 tablet    Refill:  1    **Patient requests 90 days supply**  . Fluticasone-Salmeterol (ADVAIR DISKUS) 250-50 MCG/DOSE AEPB    Sig: INHALE 1 PUFF INTO THE LUNGS TWICE DAILY    Dispense:  1 each    Refill:  11  . ciprofloxacin (CIPRO) 250 MG tablet    Sig: Take 1 tablet (250 mg total) by mouth 2 (two) times daily.    Dispense:  6 tablet    Refill:  0      Dr. Alise Calais [facility]  04/10/17

## 2017-04-10 NOTE — Addendum Note (Signed)
Addended by: Fredderick Severance on: 04/10/2017 02:08 PM   Modules accepted: Orders

## 2017-04-13 ENCOUNTER — Other Ambulatory Visit: Payer: Self-pay

## 2017-04-13 DIAGNOSIS — Z1239 Encounter for other screening for malignant neoplasm of breast: Secondary | ICD-10-CM

## 2017-04-13 DIAGNOSIS — R928 Other abnormal and inconclusive findings on diagnostic imaging of breast: Secondary | ICD-10-CM

## 2017-05-13 ENCOUNTER — Other Ambulatory Visit: Payer: Self-pay

## 2017-06-05 ENCOUNTER — Ambulatory Visit
Admission: RE | Admit: 2017-06-05 | Discharge: 2017-06-05 | Disposition: A | Discharge status: Home / Self Care | Payer: Medicare Other | Source: Ambulatory Visit | Attending: Family Medicine | Admitting: Family Medicine

## 2017-06-05 DIAGNOSIS — Z1239 Encounter for other screening for malignant neoplasm of breast: Secondary | ICD-10-CM

## 2017-06-05 DIAGNOSIS — R928 Other abnormal and inconclusive findings on diagnostic imaging of breast: Secondary | ICD-10-CM | POA: Diagnosis present

## 2017-06-05 DIAGNOSIS — R921 Mammographic calcification found on diagnostic imaging of breast: Secondary | ICD-10-CM | POA: Diagnosis not present

## 2017-06-17 ENCOUNTER — Ambulatory Visit (INDEPENDENT_AMBULATORY_CARE_PROVIDER_SITE_OTHER): Payer: Medicare Other

## 2017-06-17 DIAGNOSIS — Z23 Encounter for immunization: Secondary | ICD-10-CM | POA: Diagnosis not present

## 2017-07-21 ENCOUNTER — Other Ambulatory Visit: Payer: Self-pay

## 2017-07-24 ENCOUNTER — Other Ambulatory Visit: Payer: Self-pay

## 2017-07-24 DIAGNOSIS — J019 Acute sinusitis, unspecified: Secondary | ICD-10-CM

## 2017-07-24 MED ORDER — AMOXICILLIN 500 MG PO CAPS: 500.0000 mg | ORAL_CAPSULE | Freq: Three times a day (TID) | ORAL | 0 refills | Status: DC

## 2017-10-12 ENCOUNTER — Encounter: Payer: Self-pay | Admitting: Family Medicine

## 2017-10-12 ENCOUNTER — Ambulatory Visit (INDEPENDENT_AMBULATORY_CARE_PROVIDER_SITE_OTHER): Payer: Medicare HMO | Admitting: Family Medicine

## 2017-10-12 VITALS — BP 100/62 | HR 64 | Ht 65.0 in | Wt 142.0 lb

## 2017-10-12 DIAGNOSIS — E7800 Pure hypercholesterolemia, unspecified: Secondary | ICD-10-CM | POA: Diagnosis not present

## 2017-10-12 DIAGNOSIS — L03012 Cellulitis of left finger: Secondary | ICD-10-CM | POA: Diagnosis not present

## 2017-10-12 DIAGNOSIS — I429 Cardiomyopathy, unspecified: Secondary | ICD-10-CM | POA: Diagnosis not present

## 2017-10-12 DIAGNOSIS — J441 Chronic obstructive pulmonary disease with (acute) exacerbation: Secondary | ICD-10-CM

## 2017-10-12 DIAGNOSIS — K219 Gastro-esophageal reflux disease without esophagitis: Secondary | ICD-10-CM | POA: Diagnosis not present

## 2017-10-12 DIAGNOSIS — J302 Other seasonal allergic rhinitis: Secondary | ICD-10-CM

## 2017-10-12 DIAGNOSIS — J0100 Acute maxillary sinusitis, unspecified: Secondary | ICD-10-CM | POA: Diagnosis not present

## 2017-10-12 DIAGNOSIS — I10 Essential (primary) hypertension: Secondary | ICD-10-CM | POA: Diagnosis not present

## 2017-10-12 MED ORDER — CARVEDILOL 3.125 MG PO TABS: 3.1250 mg | ORAL_TABLET | Freq: Two times a day (BID) | ORAL | 1 refills | Status: DC

## 2017-10-12 MED ORDER — RANITIDINE HCL 150 MG PO TABS: ORAL_TABLET | ORAL | 1 refills | Status: DC

## 2017-10-12 MED ORDER — RAMIPRIL 2.5 MG PO CAPS: 2.5000 mg | ORAL_CAPSULE | Freq: Every day | ORAL | 1 refills | Status: DC

## 2017-10-12 MED ORDER — AMOXICILLIN-POT CLAVULANATE 875-125 MG PO TABS: 1.0000 | ORAL_TABLET | Freq: Two times a day (BID) | ORAL | 0 refills | Status: DC

## 2017-10-12 MED ORDER — ATORVASTATIN CALCIUM 20 MG PO TABS: 20.0000 mg | ORAL_TABLET | Freq: Every day | ORAL | 1 refills | Status: DC

## 2017-10-12 MED ORDER — MUPIROCIN 2 % EX OINT: 1.0000 "application " | TOPICAL_OINTMENT | Freq: Two times a day (BID) | CUTANEOUS | 0 refills | Status: DC

## 2017-10-12 MED ORDER — FLUTICASONE PROPIONATE 50 MCG/ACT NA SUSP: NASAL | 11 refills | Status: DC

## 2017-10-12 MED ORDER — FLUTICASONE-SALMETEROL 250-50 MCG/DOSE IN AEPB: INHALATION_SPRAY | RESPIRATORY_TRACT | 11 refills | Status: DC

## 2017-10-12 NOTE — Progress Notes (Signed)
Name: Marie Villa   MRN: [id number]    DOB: June 19, 1941   Date:10/12/2017       Progress Note  Subjective  Chief Complaint  Chief Complaint  Patient presents with  . Hyperlipidemia  . Hypertension  . Gastroesophageal Reflux  . Allergic Rhinitis     Hyperlipidemia  This is a new problem. The current episode started more than 1 year ago. The problem is controlled. Recent lipid tests were reviewed and are normal. She has no history of chronic renal disease, diabetes, hypothyroidism, liver disease, obesity or nephrotic syndrome. Pertinent negatives include no chest pain, focal sensory loss, focal weakness, leg pain, myalgias or shortness of breath. Current antihyperlipidemic treatment includes statins. The current treatment provides moderate improvement of lipids. There are no compliance problems.  Risk factors for coronary artery disease include hypertension, dyslipidemia and post-menopausal.  Hypertension  This is a chronic problem. The current episode started more than 1 year ago. The problem has been gradually improving since onset. The problem is controlled. Pertinent negatives include no anxiety, blurred vision, chest pain, headaches, malaise/fatigue, neck pain, orthopnea, palpitations, peripheral edema, PND, shortness of breath or sweats. There are no associated agents to hypertension. Risk factors for coronary artery disease include dyslipidemia and post-menopausal state. Past treatments include ACE inhibitors and beta blockers. The current treatment provides moderate improvement. There are no compliance problems.  There is no history of angina, kidney disease, CAD/MI, CVA, heart failure, left ventricular hypertrophy, PVD or retinopathy. There is no history of chronic renal disease, a hypertension causing med or renovascular disease.  Gastroesophageal Reflux  She complains of heartburn. She reports no abdominal pain, no belching, no chest pain, no choking, no coughing, no dysphagia, no early  satiety, no globus sensation, no hoarse voice, no nausea, no sore throat, no stridor, no water brash or no wheezing. This is a chronic problem. The symptoms are aggravated by certain foods. Pertinent negatives include no anemia, fatigue, melena or weight loss. There are no known risk factors. She has tried a histamine-2 antagonist for the symptoms. The treatment provided mild relief.  Sinusitis  This is a new problem. The current episode started in the past 7 days. The problem has been gradually improving since onset. There has been no fever. The pain is mild. Associated symptoms include congestion and sinus pressure. Pertinent negatives include no chills, coughing, diaphoresis, ear pain, headaches, hoarse voice, neck pain, shortness of breath, sneezing, sore throat or swollen glands. (Prod yellow nasal discharge) Treatments tried: flonase. The treatment provided mild relief.  Hand Pain   The incident occurred 5 to 7 days ago. There was no injury mechanism. The pain is present in the left fingers. The quality of the pain is described as aching. The pain is at a severity of 3/10. The pain has been constant since the incident. Pertinent negatives include no chest pain or tingling. The treatment provided no relief.  Shortness of Breath  This is a chronic problem. The current episode started more than 1 year ago. The problem occurs intermittently. The problem has been waxing and waning. Pertinent negatives include no abdominal pain, chest pain, ear pain, fever, headaches, leg pain, leg swelling, neck pain, orthopnea, PND, rash, sore throat, sputum production, swollen glands or wheezing. She has tried beta agonist inhalers and steroid inhalers for the symptoms. There is no history of a heart failure.    No problem-specific Assessment & Plan notes found for this encounter.   Past Medical History:  Diagnosis Date  .  Congestive heart disease ([place])   . COPD (chronic obstructive pulmonary disease) ([place])   .  GERD (gastroesophageal reflux disease)   . Hyperlipidemia     Past Surgical History:  Procedure Laterality Date  . COLONOSCOPY  2012   normal- cleared for 10- Iftikhar  . VAGINAL HYSTERECTOMY      Family History  Problem Relation Age of Onset  . Breast cancer Neg Hx     Social History   Socioeconomic History  . Marital status: Married    Spouse name: Not on file  . Number of children: Not on file  . Years of education: Not on file  . Highest education level: Not on file  Social Needs  . Financial resource strain: Not on file  . Food insecurity - worry: Not on file  . Food insecurity - inability: Not on file  . Transportation needs - medical: Not on file  . Transportation needs - non-medical: Not on file  Occupational History  . Not on file  Tobacco Use  . Smoking status: Former Research scientist (life sciences)  . Smokeless tobacco: Never Used  Substance and Sexual Activity  . Alcohol use: No    Alcohol/week: 0.0 oz  . Drug use: No  . Sexual activity: Yes  Other Topics Concern  . Not on file  Social History Narrative  . Not on file    Allergies  Allergen Reactions  . Sulfa Antibiotics     Outpatient Medications Prior to Visit  Medication Sig Dispense Refill  . atorvastatin (LIPITOR) 20 MG tablet Take 1 tablet (20 mg total) by mouth daily. 90 tablet 1  . carvedilol (COREG) 3.125 MG tablet Take 1 tablet by mouth 2 (two) times daily. [place]    . fluticasone (FLONASE) 50 MCG/ACT nasal spray SHAKE LIQUID AND USE 2 SPRAYS IN EACH NOSTRIL DAILY 16 g 11  . Fluticasone-Salmeterol (ADVAIR DISKUS) 250-50 MCG/DOSE AEPB INHALE 1 PUFF INTO THE LUNGS TWICE DAILY 1 each 11  . ramipril (ALTACE) 2.5 MG capsule Take 1 capsule by mouth daily. Dr Clayborn Bigness    . ranitidine (ZANTAC) 150 MG tablet TAKE 1 TABLET(150 MG) BY MOUTH AT BEDTIME 90 tablet 1  . amoxicillin (AMOXIL) 500 MG capsule Take 1 capsule (500 mg total) 3 (three) times daily by mouth. 30 capsule 0  . ciprofloxacin (CIPRO) 250 MG tablet Take  1 tablet (250 mg total) by mouth 2 (two) times daily. 6 tablet 0   No facility-administered medications prior to visit.     Review of Systems  Constitutional: Negative for chills, diaphoresis, fatigue, fever, malaise/fatigue and weight loss.  HENT: Positive for congestion and sinus pressure. Negative for ear discharge, ear pain, hoarse voice, sneezing and sore throat.   Eyes: Negative for blurred vision.  Respiratory: Negative for cough, sputum production, choking, shortness of breath and wheezing.   Cardiovascular: Negative for chest pain, palpitations, orthopnea, leg swelling and PND.  Gastrointestinal: Positive for heartburn. Negative for abdominal pain, blood in stool, constipation, diarrhea, dysphagia, melena and nausea.  Genitourinary: Negative for dysuria, frequency, hematuria and urgency.  Musculoskeletal: Negative for back pain, joint pain, myalgias and neck pain.  Skin: Negative for rash.  Neurological: Negative for dizziness, tingling, sensory change, focal weakness and headaches.  Endo/Heme/Allergies: Negative for environmental allergies and polydipsia. Does not bruise/bleed easily.  Psychiatric/Behavioral: Negative for depression and suicidal ideas. The patient is not nervous/anxious and does not have insomnia.      Objective  Vitals:   10/12/17 0857  BP: 100/62  Pulse: 64  Weight: 142 lb (64.4 kg)  Height: 5\' 5"  (1.651 m)    Physical Exam  Constitutional: She is well-developed, well-nourished, and in no distress. No distress.  HENT:  Head: Normocephalic and atraumatic.  Right Ear: Tympanic membrane, external ear and ear canal normal.  Left Ear: Tympanic membrane, external ear and ear canal normal.  Nose: Right sinus exhibits maxillary sinus tenderness. Left sinus exhibits maxillary sinus tenderness.  Mouth/Throat: Posterior oropharyngeal erythema present. No oropharyngeal exudate or posterior oropharyngeal edema.  Eyes: Conjunctivae and EOM are normal. Pupils are  equal, round, and reactive to light. Right eye exhibits no discharge. Left eye exhibits no discharge.  Neck: Normal range of motion. Neck supple. No JVD present. No thyromegaly present.  Cardiovascular: Normal rate, regular rhythm, normal heart sounds and intact distal pulses. Exam reveals no gallop and no friction rub.  No murmur heard. Pulmonary/Chest: Effort normal and breath sounds normal. She has no wheezes. She has no rales.  Abdominal: Soft. Bowel sounds are normal. She exhibits no mass. There is no tenderness. There is no guarding.  Musculoskeletal: Normal range of motion. She exhibits no edema.  Lymphadenopathy:       Head (right side): No submandibular adenopathy present.       Head (left side): No submandibular adenopathy present.    She has no cervical adenopathy.  Neurological: She is alert. She has normal reflexes.  Skin: Skin is warm and dry. She is not diaphoretic. There is erythema.  Erythema base left fifth digit  Psychiatric: Mood and affect normal.  Nursing note and vitals reviewed.     Assessment & Plan  Problem List Items Addressed This Visit      Cardiovascular and Mediastinum   Cardiomyopathy ([place])   Relevant Medications   atorvastatin (LIPITOR) 20 MG tablet   ramipril (ALTACE) 2.5 MG capsule   carvedilol (COREG) 3.125 MG tablet   Essential hypertension - Primary   Relevant Medications   atorvastatin (LIPITOR) 20 MG tablet   ramipril (ALTACE) 2.5 MG capsule   carvedilol (COREG) 3.125 MG tablet   Other Relevant Orders   Renal Function Panel     Respiratory   Other seasonal allergic rhinitis   Relevant Medications   fluticasone (FLONASE) 50 MCG/ACT nasal spray   Chronic obstructive pulmonary disease with acute exacerbation (HCC)   Relevant Medications   Fluticasone-Salmeterol (ADVAIR DISKUS) 250-50 MCG/DOSE AEPB   fluticasone (FLONASE) 50 MCG/ACT nasal spray     Digestive   Acid reflux   Relevant Medications   ranitidine (ZANTAC) 150 MG tablet      Other   Hypercholesteremia   Relevant Medications   atorvastatin (LIPITOR) 20 MG tablet   ramipril (ALTACE) 2.5 MG capsule   carvedilol (COREG) 3.125 MG tablet   Other Relevant Orders   Lipid panel    Other Visit Diagnoses    Acute non-recurrent maxillary sinusitis       Relevant Medications   fluticasone (FLONASE) 50 MCG/ACT nasal spray   amoxicillin-clavulanate (AUGMENTIN) 875-125 MG tablet   Paronychia of finger of left hand       Relevant Medications   amoxicillin-clavulanate (AUGMENTIN) 875-125 MG tablet   mupirocin ointment (BACTROBAN) 2 %      Meds ordered this encounter  Medications  . Fluticasone-Salmeterol (ADVAIR DISKUS) 250-50 MCG/DOSE AEPB    Sig: INHALE 1 PUFF INTO THE LUNGS TWICE DAILY    Dispense:  1 each    Refill:  11  . ranitidine (ZANTAC) 150 MG tablet    Sig: TAKE  1 TABLET(150 MG) BY MOUTH AT BEDTIME    Dispense:  90 tablet    Refill:  1    **Patient requests 90 days supply**  . atorvastatin (LIPITOR) 20 MG tablet    Sig: Take 1 tablet (20 mg total) by mouth daily.    Dispense:  90 tablet    Refill:  1  . fluticasone (FLONASE) 50 MCG/ACT nasal spray    Sig: SHAKE LIQUID AND USE 2 SPRAYS IN EACH NOSTRIL DAILY    Dispense:  16 g    Refill:  11  . ramipril (ALTACE) 2.5 MG capsule    Sig: Take 1 capsule (2.5 mg total) by mouth daily. Dr Clayborn Bigness    Dispense:  90 capsule    Refill:  1  . carvedilol (COREG) 3.125 MG tablet    Sig: Take 1 tablet (3.125 mg total) by mouth 2 (two) times daily. [place]    Dispense:  180 tablet    Refill:  1  . amoxicillin-clavulanate (AUGMENTIN) 875-125 MG tablet    Sig: Take 1 tablet by mouth 2 (two) times daily.    Dispense:  20 tablet    Refill:  0  . mupirocin ointment (BACTROBAN) 2 %    Sig: Apply 1 application topically 2 (two) times daily.    Dispense:  22 g    Refill:  0      Dr. [facility]  10/12/17

## 2017-10-13 LAB — RENAL FUNCTION PANEL
ALBUMIN: 4.2 g/dL (ref 3.5–4.8)
BUN/Creatinine Ratio: 17 (ref 12–28)
BUN: 24 mg/dL (ref 8–27)
CALCIUM: 9.3 mg/dL (ref 8.7–10.3)
CHLORIDE: 107 mmol/L — AB (ref 96–106)
CO2: 21 mmol/L (ref 20–29)
Creatinine, Ser: 1.43 mg/dL — ABNORMAL HIGH (ref 0.57–1.00)
GFR calc Af Amer: 40 mL/min/{1.73_m2} — ABNORMAL LOW (ref >59)
GFR calc non Af Amer: 35 mL/min/{1.73_m2} — ABNORMAL LOW (ref >59)
GLUCOSE: 88 mg/dL (ref 65–99)
POTASSIUM: 4.4 mmol/L (ref 3.5–5.2)
Phosphorus: 4.2 mg/dL (ref 2.5–4.5)
Sodium: 143 mmol/L (ref 134–144)

## 2017-10-13 LAB — LIPID PANEL
CHOL/HDL RATIO: 3.1 ratio (ref 0.0–4.4)
CHOLESTEROL TOTAL: 162 mg/dL (ref 100–199)
HDL: 52 mg/dL (ref >39)
LDL CALC: 82 mg/dL (ref 0–99)
TRIGLYCERIDES: 140 mg/dL (ref 0–149)
VLDL CHOLESTEROL CAL: 28 mg/dL (ref 5–40)

## 2017-11-13 DIAGNOSIS — H43813 Vitreous degeneration, bilateral: Secondary | ICD-10-CM | POA: Diagnosis not present

## 2017-12-16 ENCOUNTER — Encounter: Payer: Self-pay | Admitting: Family Medicine

## 2017-12-16 ENCOUNTER — Ambulatory Visit (INDEPENDENT_AMBULATORY_CARE_PROVIDER_SITE_OTHER): Payer: Medicare HMO | Admitting: Family Medicine

## 2017-12-16 VITALS — BP 100/64 | HR 88 | Ht 65.0 in | Wt 140.0 lb

## 2017-12-16 DIAGNOSIS — J0100 Acute maxillary sinusitis, unspecified: Secondary | ICD-10-CM

## 2017-12-16 DIAGNOSIS — R350 Frequency of micturition: Secondary | ICD-10-CM | POA: Diagnosis not present

## 2017-12-16 DIAGNOSIS — J441 Chronic obstructive pulmonary disease with (acute) exacerbation: Secondary | ICD-10-CM

## 2017-12-16 DIAGNOSIS — J410 Simple chronic bronchitis: Secondary | ICD-10-CM | POA: Diagnosis not present

## 2017-12-16 LAB — POCT URINALYSIS DIPSTICK
BILIRUBIN UA: NEGATIVE
GLUCOSE UA: NEGATIVE
Ketones, UA: NEGATIVE
Leukocytes, UA: NEGATIVE
Nitrite, UA: NEGATIVE
PH UA: 5 (ref 5.0–8.0)
PROTEIN UA: NEGATIVE
RBC UA: NEGATIVE
Spec Grav, UA: 1.01 (ref 1.010–1.025)
Urobilinogen, UA: 0.2 E.U./dL

## 2017-12-16 MED ORDER — AMOXICILLIN 500 MG PO CAPS: 500.0000 mg | ORAL_CAPSULE | Freq: Three times a day (TID) | ORAL | 0 refills | Status: DC

## 2017-12-16 MED ORDER — PREDNISONE 10 MG PO TABS: 10.0000 mg | ORAL_TABLET | Freq: Every day | ORAL | 0 refills | Status: DC

## 2017-12-16 MED ORDER — AMITRIPTYLINE HCL 10 MG PO TABS: 10.0000 mg | ORAL_TABLET | Freq: Every day | ORAL | 1 refills | Status: DC

## 2017-12-16 NOTE — Progress Notes (Signed)
Name: Marie Villa   MRN: [id number]    DOB: May 17, 1941   Date:12/16/2017       Progress Note  Subjective  Chief Complaint  Chief Complaint  Patient presents with  . Sinusitis    needs something for cong  . Urinary Tract Infection    lowe back pain    Sinusitis  This is a new problem. The current episode started in the past 7 days. The problem has been waxing and waning since onset. There has been no fever. Associated symptoms include congestion, coughing and sinus pressure. Pertinent negatives include no chills, diaphoresis, ear pain, headaches, hoarse voice, neck pain, shortness of breath, sneezing, sore throat or swollen glands. (Prod yellow/green) Treatments tried: flonase. The treatment provided mild relief.  Urinary Tract Infection   This is a new problem. The current episode started in the past 7 days. The problem occurs intermittently. The problem has been unchanged. The quality of the pain is described as burning. The pain is mild (lumbar region). There has been no fever. Associated symptoms include frequency. Pertinent negatives include no chills, discharge, flank pain, hematuria, hesitancy, nausea, sweats, urgency or vomiting. She has tried nothing for the symptoms. Her past medical history is significant for recurrent UTIs.  Depression         This is a new (grief) problem.  The current episode started in the past 7 days.   Associated symptoms include insomnia.  Associated symptoms include no helplessness, no hopelessness, not irritable, no restlessness, no body aches, no myalgias, no headaches, not sad and no suicidal ideas.     The symptoms are aggravated by family issues (sister death).  Previous treatment provided mild relief.   No problem-specific Assessment & Plan notes found for this encounter.   Past Medical History:  Diagnosis Date  . Congestive heart disease ([place])   . COPD (chronic obstructive pulmonary disease) ([place])   . GERD (gastroesophageal reflux disease)   .  Hyperlipidemia     Past Surgical History:  Procedure Laterality Date  . COLONOSCOPY  2012   normal- cleared for 10- Ift[facility]har  . VAGINAL HYSTERECTOMY      Family History  Problem Relation Age of Onset  . Breast cancer Neg Hx     Social History   Socioeconomic History  . Marital status: Married    Spouse name: Not on file  . Number of children: Not on file  . Years of education: Not on file  . Highest education level: Not on file  Occupational History  . Not on file  Social Needs  . Financial resource strain: Not on file  . Food insecurity:    Worry: Not on file    Inability: Not on file  . Transportation needs:    Medical: Not on file    Non-medical: Not on file  Tobacco Use  . Smoking status: Former Research scientist (life sciences)  . Smokeless tobacco: Never Used  Substance and Sexual Activity  . Alcohol use: No    Alcohol/week: 0.0 oz  . Drug use: No  . Sexual activity: Yes  Lifestyle  . Physical activity:    Days per week: Not on file    Minutes per session: Not on file  . Stress: Not on file  Relationships  . Social connections:    Talks on phone: Not on file    Gets together: Not on file    Attends religious service: Not on file    Active member of club or organization: Not on file  Attends meetings of clubs or organizations: Not on file    Relationship status: Not on file  . Intimate partner violence:    Fear of current or ex partner: Not on file    Emotionally abused: Not on file    Physically abused: Not on file    Forced sexual activity: Not on file  Other Topics Concern  . Not on file  Social History Narrative  . Not on file    Allergies  Allergen Reactions  . Sulfa Antibiotics     Outpatient Medications Prior to Visit  Medication Sig Dispense Refill  . atorvastatin (LIPITOR) 20 MG tablet Take 1 tablet (20 mg total) by mouth daily. 90 tablet 1  . carvedilol (COREG) 3.125 MG tablet Take 1 tablet (3.125 mg total) by mouth 2 (two) times daily. [place] 180  tablet 1  . fluticasone (FLONASE) 50 MCG/ACT nasal spray SHAKE LIQUID AND USE 2 SPRAYS IN EACH NOSTRIL DAILY 16 g 11  . Fluticasone-Salmeterol (ADVAIR DISKUS) 250-50 MCG/DOSE AEPB INHALE 1 PUFF INTO THE LUNGS TWICE DAILY 1 each 11  . mupirocin ointment (BACTROBAN) 2 % Apply 1 application topically 2 (two) times daily. 22 g 0  . ramipril (ALTACE) 2.5 MG capsule Take 1 capsule (2.5 mg total) by mouth daily. Dr Clayborn Bigness 90 capsule 1  . ranitidine (ZANTAC) 150 MG tablet TAKE 1 TABLET(150 MG) BY MOUTH AT BEDTIME 90 tablet 1  . amoxicillin-clavulanate (AUGMENTIN) 875-125 MG tablet Take 1 tablet by mouth 2 (two) times daily. 20 tablet 0   No facility-administered medications prior to visit.     Review of Systems  Constitutional: Negative for chills, diaphoresis, fever, malaise/fatigue and weight loss.  HENT: Positive for congestion and sinus pressure. Negative for ear discharge, ear pain, hoarse voice, sneezing and sore throat.   Eyes: Negative for blurred vision.  Respiratory: Positive for cough. Negative for sputum production, shortness of breath and wheezing.   Cardiovascular: Negative for chest pain, palpitations and leg swelling.  Gastrointestinal: Negative for abdominal pain, blood in stool, constipation, diarrhea, heartburn, melena, nausea and vomiting.  Genitourinary: Positive for frequency. Negative for dysuria, flank pain, hematuria, hesitancy and urgency.  Musculoskeletal: Negative for back pain, joint pain, myalgias and neck pain.  Skin: Negative for rash.  Neurological: Negative for dizziness, tingling, sensory change, focal weakness and headaches.  Endo/Heme/Allergies: Negative for environmental allergies and polydipsia. Does not bruise/bleed easily.  Psychiatric/Behavioral: Positive for depression. Negative for suicidal ideas. The patient has insomnia. The patient is not nervous/anxious.      Objective  Vitals:   12/16/17 0934  BP: 100/64  Pulse: 88  Weight: 140 lb (63.5 kg)   Height: 5\' 5"  (1.651 m)    Physical Exam  Constitutional: She is oriented to person, place, and time. She appears well-developed and well-nourished. She is not irritable.  HENT:  Head: Normocephalic.  Right Ear: External ear normal.  Left Ear: External ear normal.  Mouth/Throat: Oropharynx is clear and moist.  Eyes: Pupils are equal, round, and reactive to light. Conjunctivae and EOM are normal. Lids are everted and swept, no foreign bodies found. Left eye exhibits no hordeolum. No foreign body present in the left eye. Right conjunctiva is not injected. Left conjunctiva is not injected. No scleral icterus.  Neck: Normal range of motion. Neck supple. No JVD present. No tracheal deviation present. No thyromegaly present.  Cardiovascular: Normal rate, regular rhythm, normal heart sounds and intact distal pulses. Exam reveals no gallop and no friction rub.  No murmur heard.  Pulmonary/Chest: Effort normal and breath sounds normal. No respiratory distress. She has no wheezes. She has no rales.  Abdominal: Soft. Bowel sounds are normal. She exhibits no mass. There is no hepatosplenomegaly. There is no tenderness. There is no rebound and no guarding.  Musculoskeletal: Normal range of motion. She exhibits no edema or tenderness.  Lymphadenopathy:    She has no cervical adenopathy.  Neurological: She is alert and oriented to person, place, and time. She has normal strength. She displays normal reflexes. No cranial nerve deficit.  Skin: Skin is warm. No rash noted.  Psychiatric: She has a normal mood and affect. Her mood appears not anxious. She does not exhibit a depressed mood.  Nursing note and vitals reviewed.     Assessment & Plan  Problem List Items Addressed This Visit      Respiratory   Simple chronic bronchitis (HCC)   Chronic obstructive pulmonary disease with acute exacerbation (HCC) - Primary   Relevant Medications   predniSONE (DELTASONE) 10 MG tablet    Other Visit Diagnoses     Acute maxillary sinusitis, recurrence not specified       Relevant Medications   predniSONE (DELTASONE) 10 MG tablet   amoxicillin (AMOXIL) 500 MG capsule   Urine frequency       Relevant Orders   POCT urinalysis dipstick (Completed)      Meds ordered this encounter  Medications  . predniSONE (DELTASONE) 10 MG tablet    Sig: Take 1 tablet (10 mg total) by mouth daily with breakfast.    Dispense:  30 tablet    Refill:  0  . amoxicillin (AMOXIL) 500 MG capsule    Sig: Take 1 capsule (500 mg total) by mouth 3 (three) times daily.    Dispense:  30 capsule    Refill:  0  . amitriptyline (ELAVIL) 10 MG tablet    Sig: Take 1 tablet (10 mg total) by mouth at bedtime.    Dispense:  30 tablet    Refill:  1      Dr. [facility]  12/16/17

## 2018-04-13 DIAGNOSIS — E785 Hyperlipidemia, unspecified: Secondary | ICD-10-CM | POA: Diagnosis not present

## 2018-04-13 DIAGNOSIS — J449 Chronic obstructive pulmonary disease, unspecified: Secondary | ICD-10-CM | POA: Diagnosis not present

## 2018-04-13 DIAGNOSIS — R0602 Shortness of breath: Secondary | ICD-10-CM | POA: Diagnosis not present

## 2018-04-13 DIAGNOSIS — I429 Cardiomyopathy, unspecified: Secondary | ICD-10-CM | POA: Diagnosis not present

## 2018-04-13 DIAGNOSIS — I10 Essential (primary) hypertension: Secondary | ICD-10-CM | POA: Diagnosis not present

## 2018-04-13 DIAGNOSIS — R011 Cardiac murmur, unspecified: Secondary | ICD-10-CM | POA: Diagnosis not present

## 2018-04-13 DIAGNOSIS — N189 Chronic kidney disease, unspecified: Secondary | ICD-10-CM | POA: Diagnosis not present

## 2018-04-13 DIAGNOSIS — K219 Gastro-esophageal reflux disease without esophagitis: Secondary | ICD-10-CM | POA: Diagnosis not present

## 2018-04-13 DIAGNOSIS — Z87891 Personal history of nicotine dependence: Secondary | ICD-10-CM | POA: Diagnosis not present

## 2018-05-14 ENCOUNTER — Other Ambulatory Visit: Payer: Self-pay

## 2018-05-14 ENCOUNTER — Other Ambulatory Visit: Payer: Self-pay | Admitting: Family Medicine

## 2018-05-14 DIAGNOSIS — Z1231 Encounter for screening mammogram for malignant neoplasm of breast: Secondary | ICD-10-CM

## 2018-05-31 ENCOUNTER — Other Ambulatory Visit: Payer: Self-pay | Admitting: Family Medicine

## 2018-05-31 DIAGNOSIS — J302 Other seasonal allergic rhinitis: Secondary | ICD-10-CM

## 2018-06-02 ENCOUNTER — Ambulatory Visit (INDEPENDENT_AMBULATORY_CARE_PROVIDER_SITE_OTHER): Payer: Medicare HMO | Admitting: Family Medicine

## 2018-06-02 ENCOUNTER — Encounter: Payer: Self-pay | Admitting: Family Medicine

## 2018-06-02 VITALS — BP 120/80 | HR 76 | Ht 65.0 in | Wt 142.0 lb

## 2018-06-02 DIAGNOSIS — J441 Chronic obstructive pulmonary disease with (acute) exacerbation: Secondary | ICD-10-CM

## 2018-06-02 DIAGNOSIS — J0100 Acute maxillary sinusitis, unspecified: Secondary | ICD-10-CM | POA: Diagnosis not present

## 2018-06-02 DIAGNOSIS — J302 Other seasonal allergic rhinitis: Secondary | ICD-10-CM | POA: Diagnosis not present

## 2018-06-02 DIAGNOSIS — F5101 Primary insomnia: Secondary | ICD-10-CM

## 2018-06-02 DIAGNOSIS — Z23 Encounter for immunization: Secondary | ICD-10-CM

## 2018-06-02 DIAGNOSIS — E7800 Pure hypercholesterolemia, unspecified: Secondary | ICD-10-CM

## 2018-06-02 MED ORDER — AZITHROMYCIN 250 MG PO TABS: ORAL_TABLET | ORAL | 0 refills | Status: DC

## 2018-06-02 MED ORDER — ATORVASTATIN CALCIUM 20 MG PO TABS: 20.0000 mg | ORAL_TABLET | Freq: Every day | ORAL | 1 refills | Status: DC

## 2018-06-02 MED ORDER — FLUTICASONE PROPIONATE 50 MCG/ACT NA SUSP: NASAL | 0 refills | Status: DC

## 2018-06-02 MED ORDER — PREDNISONE 10 MG PO TABS: 10.0000 mg | ORAL_TABLET | Freq: Every day | ORAL | 0 refills | Status: DC

## 2018-06-02 MED ORDER — AMITRIPTYLINE HCL 10 MG PO TABS: 10.0000 mg | ORAL_TABLET | Freq: Every day | ORAL | 1 refills | Status: DC

## 2018-06-02 MED ORDER — FLUTICASONE-SALMETEROL 250-50 MCG/DOSE IN AEPB: INHALATION_SPRAY | RESPIRATORY_TRACT | 11 refills | Status: DC

## 2018-06-02 NOTE — Progress Notes (Signed)
Date:  06/02/2018   Name:  Marie Villa   DOB:  07/01/1941   MRN:  [id number]   Chief Complaint: Insomnia; Hyperlipidemia; Allergic Rhinitis  (nasal spray); COPD (inhalers); and Flu Vaccine Insomnia  Primary symptoms: no fragmented sleep.  The current episode started more than one year. The problem occurs intermittently. The problem has been waxing and waning since onset. Past treatments include medication (amitryptiline). The treatment provided mild relief. PMH includes: hypertension.  Hyperlipidemia  This is a chronic problem. The current episode started more than 1 year ago. The problem is controlled. Recent lipid tests were reviewed and are normal. She has no history of chronic renal disease, diabetes, hypothyroidism, liver disease, obesity or nephrotic syndrome. There are no known factors aggravating her hyperlipidemia. Pertinent negatives include no chest pain, focal sensory loss, focal weakness, leg pain, myalgias or shortness of breath. There are no compliance problems.  Risk factors for coronary artery disease include hypertension, dyslipidemia and post-menopausal.  COPD  She complains of cough and wheezing. There is no chest tightness, difficulty breathing, frequent throat clearing, hemoptysis, hoarse voice, shortness of breath or sputum production. This is a new problem. The problem occurs daily. The problem has been gradually worsening. Pertinent negatives include no chest pain, ear pain, fever, headaches, myalgias, rhinorrhea, sneezing or sore throat. Her symptoms are aggravated by pollen and change in weather. She reports moderate improvement on treatment. Her past medical history is significant for COPD.  Sinusitis  This is a new problem. The current episode started in the past 7 days. The problem has been gradually worsening since onset. There has been no fever. Associated symptoms include congestion, coughing and sinus pressure. Pertinent negatives include no chills, ear pain,  headaches, hoarse voice, neck pain, shortness of breath, sneezing or sore throat. (Productive nasal /yellow-green) Past treatments include oral decongestants. The treatment provided mild relief.     Review of Systems  Constitutional: Negative.  Negative for chills, fatigue, fever and unexpected weight change.  HENT: Positive for congestion and sinus pressure. Negative for ear discharge, ear pain, hoarse voice, rhinorrhea, sneezing and sore throat.   Eyes: Negative for photophobia, pain, discharge, redness and itching.  Respiratory: Positive for cough and wheezing. Negative for hemoptysis, sputum production, shortness of breath and stridor.   Cardiovascular: Negative for chest pain.  Gastrointestinal: Negative for abdominal pain, blood in stool, constipation, diarrhea, nausea and vomiting.  Endocrine: Negative for cold intolerance, heat intolerance, polydipsia, polyphagia and polyuria.  Genitourinary: Negative for dysuria, flank pain, frequency, hematuria, menstrual problem, pelvic pain, urgency, vaginal bleeding and vaginal discharge.  Musculoskeletal: Negative for arthralgias, back pain, myalgias and neck pain.  Skin: Negative for rash.  Allergic/Immunologic: Negative for environmental allergies and food allergies.  Neurological: Negative for dizziness, focal weakness, weakness, light-headedness, numbness and headaches.  Hematological: Negative for adenopathy. Does not bruise/bleed easily.  Psychiatric/Behavioral: Negative for dysphoric mood. The patient has insomnia. The patient is not nervous/anxious.     Patient Active Problem List   Diagnosis Date Noted  . Essential hypertension 10/12/2017  . Chronic obstructive pulmonary disease with acute exacerbation ([place]) 04/10/2017  . Simple chronic bronchitis ([place]) 06/09/2016  . Other seasonal allergic rhinitis 06/09/2016  . Cardiomyopathy ([place]) 06/06/2015  . Chronic kidney disease 06/06/2015  . CCF (congestive cardiac failure) ([place])  06/06/2015  . 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 Acid reflux 06/06/2015  . Hypercholesteremia 06/06/2015  . Cardiac murmur 12/16/2013    Allergies  Allergen Reactions  . Sulfa Antibiotics  Past Surgical History:  Procedure Laterality Date  . COLONOSCOPY  2012   normal- cleared for 10- Iftikhar  . VAGINAL HYSTERECTOMY      Social History   Tobacco Use  . Smoking status: Former Research scientist (life sciences)  . Smokeless tobacco: Never Used  Substance Use Topics  . Alcohol use: No    Alcohol/week: 0.0 standard drinks  . Drug use: No     Medication list has been reviewed and updated.  Current Meds  Medication Sig  . amitriptyline (ELAVIL) 10 MG tablet Take 1 tablet (10 mg total) by mouth at bedtime.  Marland Kitchen atorvastatin (LIPITOR) 20 MG tablet Take 1 tablet (20 mg total) by mouth daily.  . carvedilol (COREG) 3.125 MG tablet Take 1 tablet (3.125 mg total) by mouth 2 (two) times daily. [place]  . fluticasone (FLONASE) 50 MCG/ACT nasal spray 1 puff each nostril bid  . Fluticasone-Salmeterol (ADVAIR DISKUS) 250-50 MCG/DOSE AEPB INHALE 1 PUFF INTO THE LUNGS TWICE DAILY  . ramipril (ALTACE) 2.5 MG capsule Take 1 capsule (2.5 mg total) by mouth daily. Dr Clayborn Bigness  . ranitidine (ZANTAC) 150 MG tablet TAKE 1 TABLET(150 MG) BY MOUTH AT BEDTIME  . [DISCONTINUED] amitriptyline (ELAVIL) 10 MG tablet Take 1 tablet (10 mg total) by mouth at bedtime.  . [DISCONTINUED] atorvastatin (LIPITOR) 20 MG tablet Take 1 tablet (20 mg total) by mouth daily.  . [DISCONTINUED] fluticasone (FLONASE) 50 MCG/ACT nasal spray SHAKE LIQUID AND USE 2 SPRAYS IN EACH NOSTRIL DAILY  . [DISCONTINUED] Fluticasone-Salmeterol (ADVAIR DISKUS) 250-50 MCG/DOSE AEPB INHALE 1 PUFF INTO THE LUNGS TWICE DAILY    PHQ 2/9 Scores 12/16/2017 12/08/2016 12/05/2015 06/06/2015  PHQ - 2 Score 0 0 0 0  PHQ- 9 Score 4 - - -    Physical Exam  Constitutional: She is oriented to person, place, and time. She appears well-developed and  well-nourished.  HENT:  Head: Normocephalic.  Right Ear: External ear normal.  Left Ear: External ear normal.  Mouth/Throat: Oropharynx is clear and moist.  Eyes: Pupils are equal, round, and reactive to light. Conjunctivae and EOM are normal. Lids are everted and swept, no foreign bodies found. Left eye exhibits no hordeolum. No foreign body present in the left eye. Right conjunctiva is not injected. Left conjunctiva is not injected. No scleral icterus.  Neck: Normal range of motion. Neck supple. No JVD present. No tracheal deviation present. No thyromegaly present.  Cardiovascular: Normal rate, regular rhythm, normal heart sounds and intact distal pulses. Exam reveals no gallop and no friction rub.  No murmur heard. Pulmonary/Chest: Effort normal and breath sounds normal. No respiratory distress. She has no wheezes. She has no rales.  Abdominal: Soft. Bowel sounds are normal. She exhibits no mass. There is no hepatosplenomegaly. There is no tenderness. There is no rebound and no guarding.  Musculoskeletal: Normal range of motion. She exhibits no edema or tenderness.  Lymphadenopathy:    She has no cervical adenopathy.  Neurological: She is alert and oriented to person, place, and time. She has normal strength. She displays normal reflexes. No cranial nerve deficit.  Skin: Skin is warm. No rash noted.  Psychiatric: She has a normal mood and affect. Her mood appears not anxious. She does not exhibit a depressed mood.  Nursing note and vitals reviewed.   BP 120/80   Pulse 76   Ht 5\' 5"  (1.651 m)   Wt 142 lb (64.4 kg)   BMI 23.63 kg/m   Assessment and Plan: 1. Chronic obstructive pulmonary disease with acute exacerbation (  [place]) Refilled Advair/ stable on meds - Fluticasone-Salmeterol (ADVAIR DISKUS) 250-50 MCG/DOSE AEPB; INHALE 1 PUFF INTO THE LUNGS TWICE DAILY  Dispense: 1 each; Refill: 11  2. Hypercholesteremia Refilled atorvastatin/ stable on meds - atorvastatin (LIPITOR) 20 MG  tablet; Take 1 tablet (20 mg total) by mouth daily.  Dispense: 90 tablet; Refill: 1  3. Other seasonal allergic rhinitis Refilled flonase nasal spray/ stable on meds - fluticasone (FLONASE) 50 MCG/ACT nasal spray; 1 puff each nostril bid  Dispense: 16 g; Refill: 0  4. Acute maxillary sinusitis, recurrence not specified Refilled flonase nasal spray and gave prednisone - fluticasone (FLONASE) 50 MCG/ACT nasal spray; 1 puff each nostril bid  Dispense: 16 g; Refill: 0 - predniSONE (DELTASONE) 10 MG tablet; Take 1 tablet (10 mg total) by mouth daily with breakfast.  Dispense: 30 tablet; Refill: 0 - azithromycin (ZITHROMAX) 250 MG tablet; 2 today then 1 a day for 4 days.  Dispense: 6 tablet; Refill: 0  5. Primary insomnia Refilled Amitriptyline/ stable on meds - amitriptyline (ELAVIL) 10 MG tablet; Take 1 tablet (10 mg total) by mouth at bedtime.  Dispense: 30 tablet; Refill: 1  6. Flu vaccine need administered - Flu Vaccine QUAD 6+ mos PF IM (Fluarix Quad PF)    Dr. Otilio Miu [facility]  06/02/2018

## 2018-06-07 ENCOUNTER — Ambulatory Visit
Admission: RE | Admit: 2018-06-07 | Discharge: 2018-06-07 | Disposition: A | Discharge status: Home / Self Care | Payer: Medicare HMO | Source: Ambulatory Visit | Attending: Family Medicine | Admitting: Family Medicine

## 2018-06-07 DIAGNOSIS — Z1231 Encounter for screening mammogram for malignant neoplasm of breast: Secondary | ICD-10-CM | POA: Diagnosis not present

## 2018-09-23 ENCOUNTER — Ambulatory Visit (INDEPENDENT_AMBULATORY_CARE_PROVIDER_SITE_OTHER): Payer: Medicare HMO | Admitting: Family Medicine

## 2018-09-23 ENCOUNTER — Encounter: Payer: Self-pay | Admitting: Family Medicine

## 2018-09-23 VITALS — BP 100/62 | HR 84 | Ht 65.0 in | Wt 138.0 lb

## 2018-09-23 DIAGNOSIS — J441 Chronic obstructive pulmonary disease with (acute) exacerbation: Secondary | ICD-10-CM | POA: Diagnosis not present

## 2018-09-23 DIAGNOSIS — J0100 Acute maxillary sinusitis, unspecified: Secondary | ICD-10-CM | POA: Diagnosis not present

## 2018-09-23 DIAGNOSIS — K219 Gastro-esophageal reflux disease without esophagitis: Secondary | ICD-10-CM | POA: Diagnosis not present

## 2018-09-23 MED ORDER — AZITHROMYCIN 250 MG PO TABS: ORAL_TABLET | ORAL | 0 refills | Status: DC

## 2018-09-23 MED ORDER — PREDNISONE 10 MG PO TABS: 10.0000 mg | ORAL_TABLET | Freq: Every day | ORAL | 0 refills | Status: DC

## 2018-09-23 MED ORDER — RANITIDINE HCL 150 MG PO TABS: ORAL_TABLET | ORAL | 1 refills | Status: DC

## 2018-09-23 MED ORDER — GUAIFENESIN-CODEINE 100-10 MG/5ML PO SYRP: 5.0000 mL | ORAL_SOLUTION | Freq: Three times a day (TID) | ORAL | 0 refills | Status: DC | PRN

## 2018-09-23 NOTE — Progress Notes (Signed)
Date:  09/23/2018   Name:  Marie Villa   DOB:  [birth year]   MRN:  [id number]   Chief Complaint: Sinusitis (cough and cong- yellow production)  Sinusitis  This is a new problem. The current episode started in the past 7 days. The problem has been gradually worsening since onset. There has been no fever. The fever has been present for 3 to 4 days. Pertinent negatives include no chills, congestion, coughing, diaphoresis, ear pain, headaches, hoarse voice, neck pain, shortness of breath, sinus pressure, sneezing, sore throat or swollen glands. Past treatments include nothing.    Review of Systems  Constitutional: Negative.  Negative for chills, diaphoresis, fatigue, fever and unexpected weight change.  HENT: Negative for congestion, ear discharge, ear pain, hoarse voice, rhinorrhea, sinus pressure, sneezing and sore throat.   Eyes: Negative for photophobia, pain, discharge, redness and itching.  Respiratory: Negative for cough, shortness of breath, wheezing and stridor.   Gastrointestinal: Negative for abdominal pain, blood in stool, constipation, diarrhea, nausea and vomiting.  Endocrine: Negative for cold intolerance, heat intolerance, polydipsia, polyphagia and polyuria.  Genitourinary: Negative for dysuria, flank pain, frequency, hematuria, menstrual problem, pelvic pain, urgency, vaginal bleeding and vaginal discharge.  Musculoskeletal: Negative for arthralgias, back pain, myalgias and neck pain.  Skin: Negative for rash.  Allergic/Immunologic: Negative for environmental allergies and food allergies.  Neurological: Negative for dizziness, weakness, light-headedness, numbness and headaches.  Hematological: Negative for adenopathy. Does not bruise/bleed easily.  Psychiatric/Behavioral: Negative for dysphoric mood. The patient is not nervous/anxious.     Patient Active Problem List   Diagnosis Date Noted  . Essential hypertension 10/12/2017  . Chronic obstructive pulmonary disease  with acute exacerbation ([place]) 04/10/2017  . Simple chronic bronchitis ([place]) 06/09/2016  . Other seasonal allergic rhinitis 06/09/2016  . Cardiomyopathy ([place]) 06/06/2015  . Chronic kidney disease 06/06/2015  . CCF (congestive cardiac failure) ([place]) 06/06/2015  . 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 Acid reflux 06/06/2015  . Hypercholesteremia 06/06/2015  . Cardiac murmur 12/16/2013    Allergies  Allergen Reactions  . Sulfa Antibiotics     Past Surgical History:  Procedure Laterality Date  . COLONOSCOPY  2012   normal- cleared for 10- Iftikhar  . VAGINAL HYSTERECTOMY      Social History   Tobacco Use  . Smoking status: Former Research scientist (life sciences)  . Smokeless tobacco: Never Used  Substance Use Topics  . Alcohol use: No    Alcohol/week: 0.0 standard drinks  . Drug use: No     Medication list has been reviewed and updated.  Current Meds  Medication Sig  . atorvastatin (LIPITOR) 20 MG tablet Take 1 tablet (20 mg total) by mouth daily.  . carvedilol (COREG) 3.125 MG tablet Take 1 tablet (3.125 mg total) by mouth 2 (two) times daily. [place]  . fluticasone (FLONASE) 50 MCG/ACT nasal spray 1 puff each nostril bid  . Fluticasone-Salmeterol (ADVAIR DISKUS) 250-50 MCG/DOSE AEPB INHALE 1 PUFF INTO THE LUNGS TWICE DAILY  . ramipril (ALTACE) 2.5 MG capsule Take 1 capsule (2.5 mg total) by mouth daily. Dr Clayborn Bigness  . ranitidine (ZANTAC) 150 MG tablet TAKE 1 TABLET(150 MG) BY MOUTH AT BEDTIME    PHQ 2/9 Scores 12/16/2017 12/08/2016 12/05/2015 06/06/2015  PHQ - 2 Score 0 0 0 0  PHQ- 9 Score 4 - - -    Physical Exam Vitals signs and nursing note reviewed.  Constitutional:      General: She is not in acute distress.  Appearance: She is not diaphoretic.  HENT:     Head: Normocephalic and atraumatic.     Right Ear: Tympanic membrane, ear canal and external ear normal.     Left Ear: Tympanic membrane, ear canal and external ear normal.     Nose:     Right Sinus: Maxillary  sinus tenderness present. No frontal sinus tenderness.     Left Sinus: Maxillary sinus tenderness present. No frontal sinus tenderness.     Mouth/Throat:     Pharynx: No oropharyngeal exudate or posterior oropharyngeal erythema.  Eyes:     General:        Right eye: No discharge.        Left eye: No discharge.     Conjunctiva/sclera: Conjunctivae normal.     Pupils: Pupils are equal, round, and reactive to light.  Neck:     Musculoskeletal: Normal range of motion and neck supple.     Thyroid: No thyromegaly.     Vascular: No JVD.  Cardiovascular:     Rate and Rhythm: Normal rate and regular rhythm.     Heart sounds: Normal heart sounds. No murmur. No friction rub. No gallop.   Pulmonary:     Effort: Pulmonary effort is normal.     Breath sounds: Normal breath sounds.  Abdominal:     General: Bowel sounds are normal.     Palpations: Abdomen is soft. There is no mass.     Tenderness: There is no abdominal tenderness. There is no guarding.  Musculoskeletal: Normal range of motion.  Lymphadenopathy:     Cervical: No cervical adenopathy.  Skin:    General: Skin is warm and dry.  Neurological:     Mental Status: She is alert.     Deep Tendon Reflexes: Reflexes are normal and symmetric.     BP 100/62   Pulse 84   Ht 5\' 5"  (1.651 m)   Wt 138 lb (62.6 kg)   BMI 22.96 kg/m   Assessment and Plan:  1. Chronic obstructive pulmonary disease with acute exacerbation (H[facility]) Acute. Copd exacerbation. prescribe prednisone.  - predniSONE (DELTASONE) 10 MG tablet; Take 1 tablet (10 mg total) by mouth daily with breakfast.  Dispense: 30 tablet; Refill: 0  2. Acute maxillary sinusitis, recurrence not specified Acute. Started 3 days ago. Prescribe prednisone and Azithromycin. - predniSONE (DELTASONE) 10 MG tablet; Take 1 tablet (10 mg total) by mouth daily with breakfast.  Dispense: 30 tablet; Refill: 0 - azithromycin (ZITHROMAX) 250 MG tablet; 2 today then 1 a day for 4 days.  Dispense: 6  tablet; Refill: 0  3. Gastroesophageal reflux disease, esophagitis presence not specified Chronic. Went off ranitidine due to cancer warning. Medication had certain lot numbers involved. Prescribe ranitidine for patient to start back on. - ranitidine (ZANTAC) 150 MG tablet; TAKE 1 TABLET(150 MG) BY MOUTH AT BEDTIME  Dispense: 90 tablet; Refill: 1

## 2018-10-27 ENCOUNTER — Other Ambulatory Visit: Payer: Self-pay | Admitting: Family Medicine

## 2018-10-27 DIAGNOSIS — J441 Chronic obstructive pulmonary disease with (acute) exacerbation: Secondary | ICD-10-CM

## 2018-11-25 DIAGNOSIS — M1712 Unilateral primary osteoarthritis, left knee: Secondary | ICD-10-CM | POA: Diagnosis not present

## 2018-11-25 DIAGNOSIS — M25562 Pain in left knee: Secondary | ICD-10-CM | POA: Insufficient documentation

## 2018-11-26 ENCOUNTER — Ambulatory Visit (INDEPENDENT_AMBULATORY_CARE_PROVIDER_SITE_OTHER): Payer: Medicare HMO | Admitting: Family Medicine

## 2018-11-26 ENCOUNTER — Encounter: Payer: Self-pay | Admitting: Family Medicine

## 2018-11-26 ENCOUNTER — Other Ambulatory Visit: Payer: Self-pay

## 2018-11-26 VITALS — BP 110/80 | HR 80 | Ht 65.0 in | Wt 141.0 lb

## 2018-11-26 DIAGNOSIS — I429 Cardiomyopathy, unspecified: Secondary | ICD-10-CM

## 2018-11-26 DIAGNOSIS — F5101 Primary insomnia: Secondary | ICD-10-CM

## 2018-11-26 DIAGNOSIS — E7800 Pure hypercholesterolemia, unspecified: Secondary | ICD-10-CM

## 2018-11-26 DIAGNOSIS — J0100 Acute maxillary sinusitis, unspecified: Secondary | ICD-10-CM

## 2018-11-26 DIAGNOSIS — J302 Other seasonal allergic rhinitis: Secondary | ICD-10-CM

## 2018-11-26 DIAGNOSIS — R69 Illness, unspecified: Secondary | ICD-10-CM | POA: Diagnosis not present

## 2018-11-26 DIAGNOSIS — J441 Chronic obstructive pulmonary disease with (acute) exacerbation: Secondary | ICD-10-CM

## 2018-11-26 DIAGNOSIS — K219 Gastro-esophageal reflux disease without esophagitis: Secondary | ICD-10-CM

## 2018-11-26 DIAGNOSIS — I10 Essential (primary) hypertension: Secondary | ICD-10-CM | POA: Diagnosis not present

## 2018-11-26 MED ORDER — RANITIDINE HCL 150 MG PO TABS: ORAL_TABLET | ORAL | 1 refills | Status: DC

## 2018-11-26 MED ORDER — FLUTICASONE PROPIONATE 50 MCG/ACT NA SUSP: NASAL | 1 refills | Status: DC

## 2018-11-26 MED ORDER — FLUTICASONE-SALMETEROL 250-50 MCG/DOSE IN AEPB: 1.0000 | INHALATION_SPRAY | Freq: Two times a day (BID) | RESPIRATORY_TRACT | 5 refills | Status: DC

## 2018-11-26 MED ORDER — AMITRIPTYLINE HCL 10 MG PO TABS: 10.0000 mg | ORAL_TABLET | Freq: Every day | ORAL | 1 refills | Status: DC

## 2018-11-26 MED ORDER — CARVEDILOL 3.125 MG PO TABS: 3.1250 mg | ORAL_TABLET | Freq: Two times a day (BID) | ORAL | 1 refills | Status: DC

## 2018-11-26 MED ORDER — ATORVASTATIN CALCIUM 20 MG PO TABS: 20.0000 mg | ORAL_TABLET | Freq: Every day | ORAL | 1 refills | Status: DC

## 2018-11-26 MED ORDER — AZITHROMYCIN 250 MG PO TABS: ORAL_TABLET | ORAL | 0 refills | Status: DC

## 2018-11-26 MED ORDER — RAMIPRIL 2.5 MG PO CAPS: 2.5000 mg | ORAL_CAPSULE | Freq: Every day | ORAL | 1 refills | Status: DC

## 2018-11-26 NOTE — Progress Notes (Signed)
Date:  11/26/2018   Name:  Marie Villa   DOB:  Jan 01, 1941   MRN:  [id number]   Chief Complaint: Hyperlipidemia; Hypertension; Allergic Rhinitis ; Gastroesophageal Reflux; Asthma; and Insomnia  Hyperlipidemia  This is a chronic problem. The current episode started more than 1 year ago. The problem is controlled. Recent lipid tests were reviewed and are normal. She has no history of chronic renal disease, diabetes, hypothyroidism, liver disease, obesity or nephrotic syndrome. There are no known factors aggravating her hyperlipidemia. Pertinent negatives include no chest pain, focal sensory loss, focal weakness, leg pain, myalgias or shortness of breath. Current antihyperlipidemic treatment includes statins. The current treatment provides moderate improvement of lipids. There are no compliance problems.  Risk factors for coronary artery disease include dyslipidemia, hypertension and post-menopausal.  Hypertension  This is a chronic problem. The current episode started more than 1 year ago. The problem is unchanged. The problem is controlled. Pertinent negatives include no anxiety, blurred vision, chest pain, headaches, malaise/fatigue, neck pain, orthopnea, palpitations, peripheral edema, PND, shortness of breath or sweats. There are no associated agents to hypertension. There are no known risk factors for coronary artery disease. Past treatments include ACE inhibitors and beta blockers. The current treatment provides moderate improvement. There are no compliance problems.  There is no history of angina, kidney disease, CAD/MI, CVA, heart failure, left ventricular hypertrophy, PVD or retinopathy. There is no history of chronic renal disease, a hypertension causing med or renovascular disease.  Gastroesophageal Reflux  She reports no abdominal pain, no belching, no chest pain, no choking, no coughing, no dysphagia, no early satiety, no globus sensation, no heartburn, no hoarse voice, no nausea, no sore  throat, no stridor, no tooth decay, no water brash or no wheezing. This is a chronic problem. The current episode started more than 1 year ago. The problem has been unchanged. The symptoms are aggravated by certain foods. Pertinent negatives include no fatigue, orthopnea or weight loss. She has tried a histamine-2 antagonist for the symptoms. The treatment provided moderate relief.  Asthma  There is no chest tightness, cough, difficulty breathing, frequent throat clearing, hemoptysis, hoarse voice, shortness of breath, sputum production or wheezing. This is a chronic problem. The problem occurs intermittently. The problem has been unchanged. Pertinent negatives include no appetite change, chest pain, dyspnea on exertion, ear congestion, ear pain, fever, headaches, heartburn, malaise/fatigue, myalgias, nasal congestion, orthopnea, PND, postnasal drip, rhinorrhea, sneezing, sore throat, sweats, trouble swallowing or weight loss. Her symptoms are aggravated by nothing. Her symptoms are alleviated by beta-agonist. Her past medical history is significant for asthma.  Insomnia  Primary symptoms: difficulty falling asleep, no malaise/fatigue.  The onset quality is gradual. The symptoms are aggravated by anxiety.    Review of Systems  Constitutional: Negative.  Negative for appetite change, chills, fatigue, fever, malaise/fatigue, unexpected weight change and weight loss.  HENT: Negative for congestion, ear discharge, ear pain, hoarse voice, postnasal drip, rhinorrhea, sinus pressure, sneezing, sore throat and trouble swallowing.   Eyes: Negative for blurred vision, photophobia, pain, discharge, redness and itching.  Respiratory: Negative for cough, hemoptysis, sputum production, choking, shortness of breath, wheezing and stridor.   Cardiovascular: Negative for chest pain, dyspnea on exertion, palpitations, orthopnea and PND.  Gastrointestinal: Negative for abdominal pain, blood in stool, constipation,  diarrhea, dysphagia, heartburn, nausea and vomiting.  Endocrine: Negative for cold intolerance, heat intolerance, polydipsia, polyphagia and polyuria.  Genitourinary: Negative for dysuria, flank pain, frequency, hematuria, menstrual problem, pelvic pain, urgency, vaginal  bleeding and vaginal discharge.  Musculoskeletal: Negative for arthralgias, back pain, myalgias and neck pain.  Skin: Negative for rash.  Allergic/Immunologic: Negative for environmental allergies and food allergies.  Neurological: Negative for dizziness, focal weakness, weakness, light-headedness, numbness and headaches.  Hematological: Negative for adenopathy. Does not bruise/bleed easily.  Psychiatric/Behavioral: Negative for dysphoric mood. The patient has insomnia. The patient is not nervous/anxious.     Patient Active Problem List   Diagnosis Date Noted  . Essential hypertension 10/12/2017  . Chronic obstructive pulmonary disease with acute exacerbation ([place]) 04/10/2017  . 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 Other seasonal allergic rhinitis 06/09/2016  . Cardiomyopathy ([place]) 06/06/2015  . Chronic kidney disease 06/06/2015  . CCF (congestive cardiac failure) ([place]) 06/06/2015  . 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[place]) 06/06/2015  . Acid reflux 06/06/2015  . Hypercholesteremia 06/06/2015  . Cardiac murmur 12/16/2013    Allergies  Allergen Reactions  . Sulfa Antibiotics     Past Surgical History:  Procedure Laterality Date  . COLONOSCOPY  2012   normal- cleared for 10- Iftikhar  . VAGINAL HYSTERECTOMY      Social History   Tobacco Use  . Smoking status: Former Research scientist (life sciences)  . Smokeless tobacco: Never Used  Substance Use Topics  . Alcohol use: No    Alcohol/week: 0.0 standard drinks  . Drug use: No     Medication list has been reviewed and updated.  Current Meds  Medication Sig  . amitriptyline (ELAVIL) 10 MG tablet Take 1 tablet (10 mg total) by mouth at bedtime.  Marland Kitchen atorvastatin (LIPITOR)  20 MG tablet Take 1 tablet (20 mg total) by mouth daily.  . carvedilol (COREG) 3.125 MG tablet Take 1 tablet (3.125 mg total) by mouth 2 (two) times daily. [place]  . fluticasone (FLONASE) 50 MCG/ACT nasal spray 1 puff each nostril bid  . ramipril (ALTACE) 2.5 MG capsule Take 1 capsule (2.5 mg total) by mouth daily. Dr Clayborn Bigness  . ranitidine (ZANTAC) 150 MG tablet TAKE 1 TABLET(150 MG) BY MOUTH AT BEDTIME  . WIXELA INHUB 250-50 MCG/DOSE AEPB INHALE 1 PUFF INTO THE LUNGS TWICE DAILY    PHQ 2/9 Scores 11/26/2018 12/16/2017 12/08/2016 12/05/2015  PHQ - 2 Score 0 0 0 0  PHQ- 9 Score 0 4 - -    Physical Exam Vitals signs and nursing note reviewed.  Constitutional:      General: She is not in acute distress.    Appearance: She is not diaphoretic.  HENT:     Head: Normocephalic and atraumatic.     Right Ear: External ear normal.     Left Ear: External ear normal.     Nose: Nose normal.  Eyes:     General:        Right eye: No discharge.        Left eye: No discharge.     Conjunctiva/sclera: Conjunctivae normal.     Pupils: Pupils are equal, round, and reactive to light.  Neck:     Musculoskeletal: Normal range of motion and neck supple.     Thyroid: No thyromegaly.     Vascular: No JVD.  Cardiovascular:     Rate and Rhythm: Normal rate and regular rhythm.     Heart sounds: Normal heart sounds. No murmur. No friction rub. No gallop.   Pulmonary:     Effort: Pulmonary effort is normal.     Breath sounds: Normal breath sounds.  Abdominal:     General: Bowel sounds are normal.  Palpations: Abdomen is soft. There is no mass.     Tenderness: There is no abdominal tenderness. There is no guarding.  Musculoskeletal: Normal range of motion.  Lymphadenopathy:     Cervical: No cervical adenopathy.  Skin:    General: Skin is warm and dry.  Neurological:     Mental Status: She is alert.     Deep Tendon Reflexes: Reflexes are normal and symmetric.     Wt Readings from Last 3  Encounters:  11/26/18 141 lb (64 kg)  09/23/18 138 lb (62.6 kg)  06/02/18 142 lb (64.4 kg)    BP 110/80   Pulse 80   Ht 5\' 5"  (1.651 m)   Wt 141 lb (64 kg)   BMI 23.46 kg/m   Assessment and Plan: 1. Primary insomnia Chronic.  Controlled.  Continue amitriptyline 10 mg 1 nightly as needed - amitriptyline (ELAVIL) 10 MG tablet; Take 1 tablet (10 mg total) by mouth at bedtime.  Dispense: 30 tablet; Refill: 1  2. Hypercholesteremia Chronic.  Controlled.  Continue atorvastatin 20 mg will check lipid panel. - atorvastatin (LIPITOR) 20 MG tablet; Take 1 tablet (20 mg total) by mouth daily.  Dispense: 90 tablet; Refill: 1 - Lipid Panel With LDL/HDL Ratio  3. Essential hypertension .  Controlled..  Chronic.  Continue carvedilol 3.1251 twice a day ramipril 2.5 once a day and is usually seen by Dr. Towanda Malkin and will refill this until she returns to his care.  Meantime we will check a renal function panel - carvedilol (COREG) 3.125 MG tablet; Take 1 tablet (3.125 mg total) by mouth 2 (two) times daily. [place]  Dispense: 180 tablet; Refill: 1 - ramipril (ALTACE) 2.5 MG capsule; Take 1 capsule (2.5 mg total) by mouth daily. Dr Clayborn Bigness  Dispense: 90 capsule; Refill: 1 - Renal function panel  4. Cardiomyopathy, unspecified type [facility]) Patient with a cardiomyopathy followed by Dr. Georgia Dom will continue the above carvedilol and ramipril. - carvedilol (COREG) 3.125 MG tablet; Take 1 tablet (3.125 mg total) by mouth 2 (two) times daily. [place]  Dispense: 180 tablet; Refill: 1 - ramipril (ALTACE) 2.5 MG capsule; Take 1 capsule (2.5 mg total) by mouth daily. Dr Clayborn Bigness  Dispense: 90 capsule; Refill: 1  5. Other seasonal allergic rhinitis Patient has seasonal allergies patient would like her Flonase refilled on a as needed basis. - fluticasone (FLONASE) 50 MCG/ACT nasal spray; 1 puff each nostril bid  Dispense: 16 g; Refill: 1  6. Acute maxillary sinusitis, recurrence not specified Acute.   Continue Flonase.  Continue a azithromycin 250 mg and today followed by 1 a day for 4 days - fluticasone (FLONASE) 50 MCG/ACT nasal spray; 1 puff each nostril bid  Dispense: 16 g; Refill: 1  7. Gastroesophageal reflux disease, esophagitis presence not specified Chronic.  Controlled.  Continue Zantac 150 daily - ranitidine (ZANTAC) 150 MG tablet; TAKE 1 TABLET(150 MG) BY MOUTH AT BEDTIME  Dispense: 90 tablet; Refill: 1  8. Chronic obstructive pulmonary disease with acute exacerbation (HCC) Patient has history of COPD and is followed by Dr. Raul Del but will refill her fluticasone- salmeterol. - Fluticasone-Salmeterol (WIXELA INHUB) 250-50 MCG/DOSE AEPB; Inhale 1 puff into the lungs 2 (two) times daily.  Dispense: 60 each; Refill: 5  9. Taking medication for chronic disease Patient is on a statin for which we will check a hepatic function panel. - Hepatic Function Panel (6)

## 2018-11-27 LAB — RENAL FUNCTION PANEL
Albumin: 4.3 g/dL (ref 3.7–4.7)
BUN / CREAT RATIO: 19 (ref 12–28)
BUN: 29 mg/dL — ABNORMAL HIGH (ref 8–27)
CALCIUM: 9.9 mg/dL (ref 8.7–10.3)
CO2: 15 mmol/L — ABNORMAL LOW (ref 20–29)
Chloride: 102 mmol/L (ref 96–106)
Creatinine, Ser: 1.56 mg/dL — ABNORMAL HIGH (ref 0.57–1.00)
GFR calc Af Amer: 36 mL/min/{1.73_m2} — ABNORMAL LOW (ref >59)
GFR calc non Af Amer: 31 mL/min/{1.73_m2} — ABNORMAL LOW (ref >59)
Glucose: 123 mg/dL — ABNORMAL HIGH (ref 65–99)
Phosphorus: 4.4 mg/dL — ABNORMAL HIGH (ref 3.0–4.3)
Potassium: 4.9 mmol/L (ref 3.5–5.2)
Sodium: 136 mmol/L (ref 134–144)

## 2018-11-27 LAB — HEPATIC FUNCTION PANEL (6)
ALT: 22 IU/L (ref 0–32)
AST: 21 IU/L (ref 0–40)
Alkaline Phosphatase: 136 IU/L — ABNORMAL HIGH (ref 39–117)
Bilirubin Total: 0.3 mg/dL (ref 0.0–1.2)
Bilirubin, Direct: 0.08 mg/dL (ref 0.00–0.40)

## 2018-11-27 LAB — LIPID PANEL WITH LDL/HDL RATIO
Cholesterol, Total: 217 mg/dL — ABNORMAL HIGH (ref 100–199)
HDL: 61 mg/dL (ref >39)
LDL CALC: 128 mg/dL — AB (ref 0–99)
LDl/HDL Ratio: 2.1 ratio (ref 0.0–3.2)
Triglycerides: 141 mg/dL (ref 0–149)
VLDL CHOLESTEROL CAL: 28 mg/dL (ref 5–40)

## 2019-01-26 DIAGNOSIS — M1712 Unilateral primary osteoarthritis, left knee: Secondary | ICD-10-CM | POA: Diagnosis not present

## 2019-03-15 ENCOUNTER — Other Ambulatory Visit: Payer: Self-pay

## 2019-03-15 DIAGNOSIS — E785 Hyperlipidemia, unspecified: Secondary | ICD-10-CM | POA: Diagnosis not present

## 2019-03-15 DIAGNOSIS — I429 Cardiomyopathy, unspecified: Secondary | ICD-10-CM | POA: Diagnosis not present

## 2019-03-15 DIAGNOSIS — J0100 Acute maxillary sinusitis, unspecified: Secondary | ICD-10-CM

## 2019-03-15 DIAGNOSIS — K219 Gastro-esophageal reflux disease without esophagitis: Secondary | ICD-10-CM | POA: Diagnosis not present

## 2019-03-15 DIAGNOSIS — R0602 Shortness of breath: Secondary | ICD-10-CM | POA: Diagnosis not present

## 2019-03-15 DIAGNOSIS — N189 Chronic kidney disease, unspecified: Secondary | ICD-10-CM | POA: Diagnosis not present

## 2019-03-15 DIAGNOSIS — J449 Chronic obstructive pulmonary disease, unspecified: Secondary | ICD-10-CM | POA: Diagnosis not present

## 2019-03-15 DIAGNOSIS — Z87891 Personal history of nicotine dependence: Secondary | ICD-10-CM | POA: Diagnosis not present

## 2019-03-15 DIAGNOSIS — I10 Essential (primary) hypertension: Secondary | ICD-10-CM | POA: Diagnosis not present

## 2019-03-15 DIAGNOSIS — J302 Other seasonal allergic rhinitis: Secondary | ICD-10-CM

## 2019-03-15 DIAGNOSIS — R011 Cardiac murmur, unspecified: Secondary | ICD-10-CM | POA: Diagnosis not present

## 2019-03-15 MED ORDER — FLUTICASONE PROPIONATE 50 MCG/ACT NA SUSP: NASAL | 1 refills | Status: DC

## 2019-03-28 ENCOUNTER — Other Ambulatory Visit: Payer: Self-pay

## 2019-03-28 ENCOUNTER — Ambulatory Visit (INDEPENDENT_AMBULATORY_CARE_PROVIDER_SITE_OTHER): Payer: Medicare HMO | Admitting: Family Medicine

## 2019-03-28 ENCOUNTER — Encounter: Payer: Self-pay | Admitting: Family Medicine

## 2019-03-28 VITALS — BP 110/60 | HR 64 | Ht 65.0 in | Wt 140.0 lb

## 2019-03-28 DIAGNOSIS — J0100 Acute maxillary sinusitis, unspecified: Secondary | ICD-10-CM | POA: Diagnosis not present

## 2019-03-28 DIAGNOSIS — J441 Chronic obstructive pulmonary disease with (acute) exacerbation: Secondary | ICD-10-CM

## 2019-03-28 MED ORDER — AZITHROMYCIN 250 MG PO TABS: ORAL_TABLET | ORAL | 0 refills | Status: DC

## 2019-03-28 MED ORDER — PREDNISONE 10 MG PO TABS: 10.0000 mg | ORAL_TABLET | Freq: Every day | ORAL | 0 refills | Status: DC

## 2019-03-28 MED ORDER — GUAIFENESIN-CODEINE 100-10 MG/5ML PO SYRP: 5.0000 mL | ORAL_SOLUTION | Freq: Four times a day (QID) | ORAL | 0 refills | Status: DC | PRN

## 2019-03-28 NOTE — Progress Notes (Signed)
Date:  03/28/2019   Name:  Marie Villa   DOB:  May 08, 1941   MRN:  [id number]   Chief Complaint: Nasal Congestion (cough and drainage- can hear "breathing at night")  Breathing Problem She complains of cough, difficulty breathing, shortness of breath, sputum production and wheezing. There is no chest tightness, frequent throat clearing, hemoptysis or hoarse voice. This is a recurrent problem. The current episode started in the past 7 days. The problem occurs daily. The problem has been gradually worsening. The cough is productive of purulent sputum and productive. Associated symptoms include ear congestion, nasal congestion, postnasal drip, rhinorrhea and sneezing. Pertinent negatives include no appetite change, chest pain, dyspnea on exertion, ear pain, fever, headaches, heartburn, malaise/fatigue, myalgias, orthopnea, PND, sore throat, sweats, trouble swallowing or weight loss. Her symptoms are aggravated by change in weather. Her symptoms are alleviated by beta-agonist. She reports moderate improvement on treatment. Her past medical history is significant for bronchitis and COPD. There is no history of asthma, bronchiectasis, emphysema or pneumonia.    Review of Systems  Constitutional: Negative.  Negative for appetite change, chills, fatigue, fever, malaise/fatigue, unexpected weight change and weight loss.  HENT: Positive for postnasal drip, rhinorrhea and sneezing. Negative for congestion, ear discharge, ear pain, hoarse voice, sinus pressure, sore throat and trouble swallowing.   Eyes: Negative for photophobia, pain, discharge, redness and itching.  Respiratory: Positive for cough, sputum production, shortness of breath and wheezing. Negative for hemoptysis and stridor.   Cardiovascular: Negative for chest pain, dyspnea on exertion, palpitations, leg swelling and PND.  Gastrointestinal: Negative for abdominal pain, blood in stool, constipation, diarrhea, heartburn, nausea and vomiting.   Endocrine: Negative for cold intolerance, heat intolerance, polydipsia, polyphagia and polyuria.  Genitourinary: Negative for dysuria, flank pain, frequency, hematuria, menstrual problem, pelvic pain, urgency, vaginal bleeding and vaginal discharge.  Musculoskeletal: Negative for arthralgias, back pain and myalgias.  Skin: Negative for rash.  Allergic/Immunologic: Negative for environmental allergies and food allergies.  Neurological: Negative for dizziness, weakness, light-headedness, numbness and headaches.  Hematological: Negative for adenopathy. Does not bruise/bleed easily.  Psychiatric/Behavioral: Negative for dysphoric mood. The patient is not nervous/anxious.     Patient Active Problem List   Diagnosis Date Noted  . Essential hypertension 10/12/2017  . Chronic obstructive pulmonary disease with acute exacerbation ([place]) 04/10/2017  . 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 Other seasonal allergic rhinitis 06/09/2016  . Cardiomyopathy ([place]) 06/06/2015  . Chronic kidney disease 06/06/2015  . CCF (congestive cardiac failure) ([place]) 06/06/2015  . 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 Acid reflux 06/06/2015  . Hypercholesteremia 06/06/2015  . Cardiac murmur 12/16/2013    Allergies  Allergen Reactions  . Sulfa Antibiotics     Past Surgical History:  Procedure Laterality Date  . COLONOSCOPY  2012   normal- cleared for 10- Iftikhar  . VAGINAL HYSTERECTOMY      Social History   Tobacco Use  . Smoking status: Former Research scientist (life sciences)  . Smokeless tobacco: Never Used  Substance Use Topics  . Alcohol use: No    Alcohol/week: 0.0 standard drinks  . Drug use: No     Medication list has been reviewed and updated.  Current Meds  Medication Sig  . amitriptyline (ELAVIL) 10 MG tablet Take 1 tablet (10 mg total) by mouth at bedtime.  Marland Kitchen atorvastatin (LIPITOR) 20 MG tablet Take 1 tablet (20 mg total) by mouth daily.  . carvedilol (COREG) 3.125 MG tablet Take 1  tablet (3.125 mg total) by  mouth 2 (two) times daily. [place]  . fluticasone (FLONASE) 50 MCG/ACT nasal spray 1 puff each nostril bid  . Fluticasone-Salmeterol (WIXELA INHUB) 250-50 MCG/DOSE AEPB Inhale 1 puff into the lungs 2 (two) times daily.  . ramipril (ALTACE) 2.5 MG capsule Take 1 capsule (2.5 mg total) by mouth daily. Dr Clayborn Bigness  . ranitidine (ZANTAC) 150 MG tablet TAKE 1 TABLET(150 MG) BY MOUTH AT BEDTIME  . [DISCONTINUED] azithromycin (ZITHROMAX) 250 MG tablet 2 today then 1 a day for 4 days.    PHQ 2/9 Scores 03/28/2019 11/26/2018 12/16/2017 12/08/2016  PHQ - 2 Score 0 0 0 0  PHQ- 9 Score 0 0 4 -    BP Readings from Last 3 Encounters:  03/28/19 110/60  11/26/18 110/80  09/23/18 100/62    Physical Exam Vitals signs and nursing note reviewed.  Constitutional:      General: She is not in acute distress.    Appearance: She is not diaphoretic.  HENT:     Head: Normocephalic and atraumatic.     Right Ear: Tympanic membrane and external ear normal.     Left Ear: Tympanic membrane and external ear normal.     Nose: No nasal tenderness, mucosal edema, congestion or rhinorrhea.     Right Turbinates: Not swollen.     Left Turbinates: Not swollen.     Right Sinus: Maxillary sinus tenderness present. No frontal sinus tenderness.     Left Sinus: Maxillary sinus tenderness present. No frontal sinus tenderness.     Mouth/Throat:     Lips: Pink.     Mouth: Mucous membranes are moist.     Palate: No mass and lesions.     Pharynx: Oropharynx is clear.  Eyes:     General:        Right eye: No discharge.        Left eye: No discharge.     Conjunctiva/sclera: Conjunctivae normal.     Pupils: Pupils are equal, round, and reactive to light.  Neck:     Musculoskeletal: Normal range of motion and neck supple.     Thyroid: No thyromegaly.     Vascular: No JVD.  Cardiovascular:     Rate and Rhythm: Normal rate and regular rhythm.     Heart sounds: Normal heart sounds. No murmur. No  friction rub. No gallop.   Pulmonary:     Effort: Pulmonary effort is normal. No respiratory distress.     Breath sounds: Wheezing present. No rhonchi or rales.  Abdominal:     General: Bowel sounds are normal.     Palpations: Abdomen is soft. There is no mass.     Tenderness: There is no abdominal tenderness. There is no guarding.  Musculoskeletal: Normal range of motion.  Lymphadenopathy:     Cervical: No cervical adenopathy.  Skin:    General: Skin is warm and dry.  Neurological:     Mental Status: She is alert.     Deep Tendon Reflexes: Reflexes are normal and symmetric.     Wt Readings from Last 3 Encounters:  03/28/19 140 lb (63.5 kg)  11/26/18 141 lb (64 kg)  09/23/18 138 lb (62.6 kg)    BP 110/60   Pulse 64   Ht 5\' 5"  (1.651 m)   Wt 140 lb (63.5 kg)   BMI 23.30 kg/m   Assessment and Plan:  1. Chronic obstructive pulmonary disease with acute exacerbation (HCC) Chronic.  Recurrent.  Patient has an acute exacerbation due to the weather and  underlying COPD.  Will initiate with prednisone 10 mg once a day as well as patient will continue her inhalation treatments.  Patient has a cough that is bothersome at night and we will use Robitussin-AC 1 teaspoon every 6 hours. - predniSONE (DELTASONE) 10 MG tablet; Take 1 tablet (10 mg total) by mouth daily with breakfast.  Dispense: 30 tablet; Refill: 0 - guaiFENesin-codeine (ROBITUSSIN AC) 100-10 MG/5ML syrup; Take 5 mLs by mouth 4 (four) times daily as needed for cough.  Dispense: 118 mL; Refill: 0  2. Acute maxillary sinusitis, recurrence not specified Chronic.  With tenderness over the right maxillary sinus.  Will treat with a azithromycin 250 mg 2 today followed by 1 a day for 4 days. - azithromycin (ZITHROMAX) 250 MG tablet; 2 today then 1 a day for 4 days  Dispense: 6 tablet; Refill: 0

## 2019-03-30 DIAGNOSIS — M1712 Unilateral primary osteoarthritis, left knee: Secondary | ICD-10-CM | POA: Diagnosis not present

## 2019-04-05 DIAGNOSIS — M1712 Unilateral primary osteoarthritis, left knee: Secondary | ICD-10-CM | POA: Diagnosis not present

## 2019-04-12 DIAGNOSIS — M1712 Unilateral primary osteoarthritis, left knee: Secondary | ICD-10-CM | POA: Diagnosis not present

## 2019-04-19 DIAGNOSIS — M1712 Unilateral primary osteoarthritis, left knee: Secondary | ICD-10-CM | POA: Diagnosis not present

## 2019-05-30 ENCOUNTER — Other Ambulatory Visit: Payer: Self-pay | Admitting: Family Medicine

## 2019-05-30 DIAGNOSIS — Z1231 Encounter for screening mammogram for malignant neoplasm of breast: Secondary | ICD-10-CM

## 2019-06-02 ENCOUNTER — Other Ambulatory Visit: Payer: Self-pay

## 2019-06-02 ENCOUNTER — Ambulatory Visit (INDEPENDENT_AMBULATORY_CARE_PROVIDER_SITE_OTHER): Payer: Medicare HMO | Admitting: Family Medicine

## 2019-06-02 ENCOUNTER — Encounter: Payer: Self-pay | Admitting: Family Medicine

## 2019-06-02 VITALS — BP 112/62 | HR 60 | Ht 65.0 in | Wt 132.0 lb

## 2019-06-02 DIAGNOSIS — I429 Cardiomyopathy, unspecified: Secondary | ICD-10-CM | POA: Diagnosis not present

## 2019-06-02 DIAGNOSIS — F5101 Primary insomnia: Secondary | ICD-10-CM

## 2019-06-02 DIAGNOSIS — I10 Essential (primary) hypertension: Secondary | ICD-10-CM

## 2019-06-02 DIAGNOSIS — J302 Other seasonal allergic rhinitis: Secondary | ICD-10-CM | POA: Diagnosis not present

## 2019-06-02 DIAGNOSIS — K219 Gastro-esophageal reflux disease without esophagitis: Secondary | ICD-10-CM

## 2019-06-02 DIAGNOSIS — J441 Chronic obstructive pulmonary disease with (acute) exacerbation: Secondary | ICD-10-CM

## 2019-06-02 DIAGNOSIS — E7800 Pure hypercholesterolemia, unspecified: Secondary | ICD-10-CM | POA: Diagnosis not present

## 2019-06-02 DIAGNOSIS — N183 Chronic kidney disease, stage 3 unspecified: Secondary | ICD-10-CM

## 2019-06-02 DIAGNOSIS — Z23 Encounter for immunization: Secondary | ICD-10-CM | POA: Diagnosis not present

## 2019-06-02 DIAGNOSIS — R748 Abnormal levels of other serum enzymes: Secondary | ICD-10-CM | POA: Diagnosis not present

## 2019-06-02 MED ORDER — FAMOTIDINE 40 MG PO TABS: 40.0000 mg | ORAL_TABLET | Freq: Every day | ORAL | 1 refills | Status: DC

## 2019-06-02 MED ORDER — FLUTICASONE PROPIONATE 50 MCG/ACT NA SUSP: NASAL | 1 refills | Status: DC

## 2019-06-02 MED ORDER — FLUTICASONE-SALMETEROL 250-50 MCG/DOSE IN AEPB: 1.0000 | INHALATION_SPRAY | Freq: Two times a day (BID) | RESPIRATORY_TRACT | 5 refills | Status: DC

## 2019-06-02 MED ORDER — AMITRIPTYLINE HCL 10 MG PO TABS: 10.0000 mg | ORAL_TABLET | Freq: Every day | ORAL | 1 refills | Status: DC

## 2019-06-02 MED ORDER — ATORVASTATIN CALCIUM 20 MG PO TABS: 20.0000 mg | ORAL_TABLET | Freq: Every day | ORAL | 1 refills | Status: DC

## 2019-06-02 NOTE — Progress Notes (Signed)
Date:  06/02/2019   Name:  Marie Villa   DOB:  [birth year]   MRN:  [id number]   Chief Complaint: COPD, Gastroesophageal Reflux (needs something in place of ranitidine), Hyperlipidemia, Hypertension, Insomnia, and influenza vacc need  COPD There is no chest tightness, cough, difficulty breathing, frequent throat clearing, hemoptysis, hoarse voice, shortness of breath, sputum production or wheezing. This is a chronic problem. The current episode started more than 1 year ago. The problem occurs intermittently. The problem has been gradually improving. Pertinent negatives include no chest pain, dyspnea on exertion, ear pain, fever, headaches, heartburn, malaise/fatigue, myalgias, orthopnea, PND, sore throat, sweats or weight loss. Her symptoms are aggravated by change in weather and URI. Her symptoms are alleviated by beta-agonist. Her past medical history is significant for COPD.  Gastroesophageal Reflux She reports no abdominal pain, no chest pain, no choking, no coughing, no dysphagia, no globus sensation, no heartburn, no hoarse voice, no nausea, no sore throat or no wheezing. This is a chronic problem. The current episode started more than 1 year ago. The problem has been waxing and waning. The symptoms are aggravated by certain foods. Pertinent negatives include no anemia, fatigue, melena, muscle weakness, orthopnea or weight loss. She has tried a histamine-2 antagonist for the symptoms. The treatment provided moderate relief.  Hyperlipidemia This is a chronic problem. The current episode started more than 1 year ago. Recent lipid tests were reviewed and are normal. She has no history of chronic renal disease, diabetes, hypothyroidism, liver disease, obesity or nephrotic syndrome. Pertinent negatives include no chest pain, focal sensory loss, focal weakness, leg pain, myalgias or shortness of breath. Current antihyperlipidemic treatment includes statins. The current treatment provides moderate  improvement of lipids. There are no compliance problems.  Risk factors for coronary artery disease include dyslipidemia and hypertension.  Hypertension This is a chronic problem. The current episode started more than 1 year ago. The problem has been gradually improving since onset. The problem is controlled. Pertinent negatives include no anxiety, chest pain, headaches, malaise/fatigue, neck pain, orthopnea, palpitations, PND, shortness of breath or sweats. Agents associated with hypertension include NSAIDs. Risk factors for coronary artery disease include dyslipidemia. Past treatments include ACE inhibitors, beta blockers and alpha 1 blockers. The current treatment provides moderate improvement. There are no compliance problems.  There is no history of angina, kidney disease, CAD/MI, CVA, heart failure, left ventricular hypertrophy or PVD. There is no history of chronic renal disease.  Insomnia Primary symptoms: no fragmented sleep, no sleep disturbance, no difficulty falling asleep, no somnolence, no frequent awakening, no premature morning awakening, no malaise/fatigue, no napping.   The current episode started more than one year. The problem occurs intermittently. The problem has been gradually improving since onset.    Review of Systems  Constitutional: Negative for chills, fatigue, fever, malaise/fatigue and weight loss.  HENT: Negative for drooling, ear discharge, ear pain, hoarse voice and sore throat.   Respiratory: Negative for cough, hemoptysis, sputum production, choking, shortness of breath and wheezing.   Cardiovascular: Negative for chest pain, dyspnea on exertion, palpitations, orthopnea, leg swelling and PND.  Gastrointestinal: Negative for abdominal pain, blood in stool, constipation, diarrhea, dysphagia, heartburn, melena and nausea.  Endocrine: Negative for polydipsia.  Genitourinary: Negative for dysuria, frequency, hematuria and urgency.  Musculoskeletal: Negative for back pain,  myalgias, muscle weakness and neck pain.  Skin: Negative for rash.  Allergic/Immunologic: Negative for environmental allergies.  Neurological: Negative for dizziness, focal weakness and headaches.  Hematological: Does not  bruise/bleed easily.  Psychiatric/Behavioral: Negative for sleep disturbance and suicidal ideas. The patient has insomnia. The patient is not nervous/anxious.     Patient Active Problem List   Diagnosis Date Noted  . Essential hypertension 10/12/2017  . Chronic obstructive pulmonary disease with acute exacerbation ([place]) 04/10/2017  . 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 Other seasonal allergic rhinitis 06/09/2016  . Cardiomyopathy ([place]) 06/06/2015  . Chronic kidney disease 06/06/2015  . CCF (congestive cardiac failure) ([place]) 06/06/2015  . 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 Acid reflux 06/06/2015  . Hypercholesteremia 06/06/2015  . Cardiac murmur 12/16/2013    Allergies  Allergen Reactions  . Sulfa Antibiotics     Past Surgical History:  Procedure Laterality Date  . COLONOSCOPY  2012   normal- cleared for 10- Iftikhar  . VAGINAL HYSTERECTOMY      Social History   Tobacco Use  . Smoking status: Former Research scientist (life sciences)  . Smokeless tobacco: Never Used  Substance Use Topics  . Alcohol use: No    Alcohol/week: 0.0 standard drinks  . Drug use: No     Medication list has been reviewed and updated.  Current Meds  Medication Sig  . amitriptyline (ELAVIL) 10 MG tablet Take 1 tablet (10 mg total) by mouth at bedtime.  Marland Kitchen atorvastatin (LIPITOR) 20 MG tablet Take 1 tablet (20 mg total) by mouth daily.  . carvedilol (COREG) 3.125 MG tablet Take 1 tablet (3.125 mg total) by mouth 2 (two) times daily. [place]  . fluticasone (FLONASE) 50 MCG/ACT nasal spray 1 puff each nostril bid  . Fluticasone-Salmeterol (WIXELA INHUB) 250-50 MCG/DOSE AEPB Inhale 1 puff into the lungs 2 (two) times daily.  . ramipril (ALTACE) 2.5 MG capsule Take 1 capsule  (2.5 mg total) by mouth daily. Dr Clayborn Bigness    [facility] 2/9 Scores 03/28/2019 11/26/2018 12/16/2017 12/08/2016  PHQ - 2 Score 0 0 0 0  PHQ- 9 Score 0 0 4 -    BP Readings from Last 3 Encounters:  06/02/19 112/62  03/28/19 110/60  11/26/18 110/80    Physical Exam Vitals signs and nursing note reviewed.  Constitutional:      Appearance: She is well-developed.  HENT:     Head: Normocephalic.     Right Ear: Tympanic membrane, ear canal and external ear normal.     Left Ear: Tympanic membrane, ear canal and external ear normal.     Nose: Nose normal. No congestion.  Eyes:     General: Lids are everted, no foreign bodies appreciated. No scleral icterus.       Left eye: No foreign body or hordeolum.     Conjunctiva/sclera: Conjunctivae normal.     Right eye: Right conjunctiva is not injected.     Left eye: Left conjunctiva is not injected.     Pupils: Pupils are equal, round, and reactive to light.  Neck:     Musculoskeletal: Normal range of motion and neck supple.     Thyroid: No thyromegaly.     Vascular: No JVD.     Trachea: No tracheal deviation.  Cardiovascular:     Rate and Rhythm: Normal rate and regular rhythm.     Heart sounds: Normal heart sounds. No murmur. No friction rub. No gallop.   Pulmonary:     Effort: Pulmonary effort is normal. No respiratory distress.     Breath sounds: Normal breath sounds. No stridor. No wheezing, rhonchi or rales.  Abdominal:     General: Bowel sounds are normal.  Palpations: Abdomen is soft. There is no mass.     Tenderness: There is no abdominal tenderness. There is no guarding or rebound.  Musculoskeletal: Normal range of motion.        General: No tenderness.  Lymphadenopathy:     Cervical: No cervical adenopathy.  Skin:    General: Skin is warm.     Findings: No rash.  Neurological:     Mental Status: She is alert and oriented to person, place, and time.     Cranial Nerves: No cranial nerve deficit.     Deep Tendon Reflexes:  Reflexes normal.  Psychiatric:        Mood and Affect: Mood is not anxious or depressed.     Wt Readings from Last 3 Encounters:  06/02/19 132 lb (59.9 kg)  03/28/19 140 lb (63.5 kg)  11/26/18 141 lb (64 kg)    BP 112/62   Pulse 60   Ht 5\' 5"  (1.651 m)   Wt 132 lb (59.9 kg)   BMI 21.97 kg/m   Assessment and Plan:  1. Other seasonal allergic rhinitis Patient with history of seasonal allergies controlled with Flonase. - fluticasone (FLONASE) 50 MCG/ACT nasal spray; 1 puff each nostril bid  Dispense: 16 g; Refill: 1  2. Essential hypertension Chronic.  Controlled.  Followed by Dr. Clayborn Bigness.  Will continue ramipril and carvedilol.  Will check renal function panel. - Renal Function Panel  3. Cardiomyopathy, unspecified type ([place]) Followed by Dr. Towanda Malkin and will continue medications as noted above.  4. Chronic obstructive pulmonary disease with acute exacerbation (HCC) History of COPD with no acute exacerbation at this time.  Will continue fluticasone salmeterol 1 puff twice a day. - Fluticasone-Salmeterol (WIXELA INHUB) 250-50 MCG/DOSE AEPB; Inhale 1 puff into the lungs 2 (two) times daily.  Dispense: 60 each; Refill: 5  5. Hypercholesteremia Chronic.  Controlled.  Continue atorvastatin 20 mg once a day.  Will check lipid panel. - atorvastatin (LIPITOR) 20 MG tablet; Take 1 tablet (20 mg total) by mouth daily.  Dispense: 90 tablet; Refill: 1 - Lipid Panel With LDL/HDL Ratio  6. Primary insomnia Chronic.  Controlled.  Continue amitriptyline 10 mg 1 at hour sleep. - amitriptyline (ELAVIL) 10 MG tablet; Take 1 tablet (10 mg total) by mouth at bedtime.  Dispense: 90 tablet; Refill: 1  7. Gastroesophageal reflux disease, esophagitis presence not specified We will switch over from ranitidine which is having production problems and we will initiate famotidine 40 mg once a day since patient has had some breakthrough reflux. - famotidine (PEPCID) 40 MG tablet; Take 1 tablet (40  mg total) by mouth daily.  Dispense: 90 tablet; Refill: 1  8. Elevated alkaline phosphatase level Patient was noted to have mild elevation of the alkaline phosphatase and we will repeat hepatic function panel to evaluate - Hepatic Function Panel (6)  9. CKD (chronic kidney disease) stage 3, GFR 30-59 ml/min (HCC) Patient has had a stable CKD and we will recheck renal function panel to assess GFR. - Renal Function Panel  10. Influenza vaccine needed Discussed and administered.

## 2019-06-03 LAB — LIPID PANEL WITH LDL/HDL RATIO
Cholesterol, Total: 185 mg/dL (ref 100–199)
HDL: 55 mg/dL (ref >39)
LDL Chol Calc (NIH): 100 mg/dL — ABNORMAL HIGH (ref 0–99)
LDL/HDL Ratio: 1.8 ratio (ref 0.0–3.2)
Triglycerides: 174 mg/dL — ABNORMAL HIGH (ref 0–149)
VLDL Cholesterol Cal: 30 mg/dL (ref 5–40)

## 2019-06-03 LAB — RENAL FUNCTION PANEL
Albumin: 4.1 g/dL (ref 3.7–4.7)
BUN/Creatinine Ratio: 12 (ref 12–28)
BUN: 19 mg/dL (ref 8–27)
CO2: 22 mmol/L (ref 20–29)
Calcium: 9.2 mg/dL (ref 8.7–10.3)
Chloride: 107 mmol/L — ABNORMAL HIGH (ref 96–106)
Creatinine, Ser: 1.53 mg/dL — ABNORMAL HIGH (ref 0.57–1.00)
GFR calc Af Amer: 37 mL/min/{1.73_m2} — ABNORMAL LOW (ref >59)
GFR calc non Af Amer: 32 mL/min/{1.73_m2} — ABNORMAL LOW (ref >59)
Glucose: 96 mg/dL (ref 65–99)
Phosphorus: 4.1 mg/dL (ref 3.0–4.3)
Potassium: 5.1 mmol/L (ref 3.5–5.2)
Sodium: 143 mmol/L (ref 134–144)

## 2019-06-03 LAB — HEPATIC FUNCTION PANEL (6)
ALT: 17 IU/L (ref 0–32)
AST: 21 IU/L (ref 0–40)
Albumin: 4.4 g/dL (ref 3.7–4.7)
Alkaline Phosphatase: 153 IU/L — ABNORMAL HIGH (ref 39–117)
Bilirubin Total: 0.4 mg/dL (ref 0.0–1.2)
Bilirubin, Direct: 0.11 mg/dL (ref 0.00–0.40)

## 2019-06-06 ENCOUNTER — Other Ambulatory Visit: Payer: Self-pay

## 2019-06-06 DIAGNOSIS — J302 Other seasonal allergic rhinitis: Secondary | ICD-10-CM

## 2019-06-06 MED ORDER — FLUTICASONE PROPIONATE 50 MCG/ACT NA SUSP: NASAL | 1 refills | Status: DC

## 2019-06-09 ENCOUNTER — Other Ambulatory Visit: Payer: Self-pay

## 2019-06-09 ENCOUNTER — Ambulatory Visit
Admission: RE | Admit: 2019-06-09 | Discharge: 2019-06-09 | Disposition: A | Discharge status: Home / Self Care | Payer: Medicare HMO | Source: Ambulatory Visit | Attending: Family Medicine | Admitting: Family Medicine

## 2019-06-09 DIAGNOSIS — Z1231 Encounter for screening mammogram for malignant neoplasm of breast: Secondary | ICD-10-CM | POA: Insufficient documentation

## 2019-07-05 DIAGNOSIS — M25462 Effusion, left knee: Secondary | ICD-10-CM | POA: Diagnosis not present

## 2019-07-05 DIAGNOSIS — M1712 Unilateral primary osteoarthritis, left knee: Secondary | ICD-10-CM | POA: Diagnosis not present

## 2019-07-20 ENCOUNTER — Other Ambulatory Visit: Payer: Self-pay

## 2019-07-20 MED ORDER — AZITHROMYCIN 250 MG PO TABS: ORAL_TABLET | ORAL | 0 refills | Status: DC

## 2019-07-20 NOTE — Progress Notes (Unsigned)
Sent in [place]

## 2019-08-11 ENCOUNTER — Encounter: Payer: Self-pay | Admitting: Internal Medicine

## 2019-08-11 ENCOUNTER — Ambulatory Visit (INDEPENDENT_AMBULATORY_CARE_PROVIDER_SITE_OTHER): Payer: Medicare HMO | Admitting: Internal Medicine

## 2019-08-11 ENCOUNTER — Other Ambulatory Visit: Payer: Self-pay

## 2019-08-11 DIAGNOSIS — J0100 Acute maxillary sinusitis, unspecified: Secondary | ICD-10-CM | POA: Diagnosis not present

## 2019-08-11 DIAGNOSIS — J441 Chronic obstructive pulmonary disease with (acute) exacerbation: Secondary | ICD-10-CM | POA: Diagnosis not present

## 2019-08-11 MED ORDER — PREDNISONE 10 MG PO TABS: 10.0000 mg | ORAL_TABLET | ORAL | 0 refills | Status: AC

## 2019-08-11 MED ORDER — AZITHROMYCIN 250 MG PO TABS: ORAL_TABLET | ORAL | 0 refills | Status: AC

## 2019-08-11 NOTE — Progress Notes (Signed)
Date:  08/11/2019   Name:  Marie Villa   DOB:  [birth year]   MRN:  [id number]  I connected with this patient, Marie Villa, by telephone at the patient's salon.  I verified that I am speaking with the correct person using two identifiers. This visit was conducted via telephone due to the Covid-19 outbreak from my office at [facility] in [place], [place]. I discussed the limitations, risks, security and privacy concerns of performing an evaluation and management service by telephone. I also discussed with the patient that there may be a patient responsible charge related to this service. The patient expressed understanding and agreed to proceed.  Chief Complaint: Sinusitis (coughing with congestion- thick, white production, no pain around cheeks, no fever- using nasal spray)  Sinusitis This is a new problem. The problem has been gradually worsening since onset. There has been no fever. The pain is mild. Associated symptoms include congestion and sinus pressure. Pertinent negatives include no chills, coughing, ear pain, headaches, shortness of breath, sneezing or sore throat.    Lab Results  Component Value Date   CREATININE 1.53 (H) 06/02/2019   BUN 19 06/02/2019   NA 143 06/02/2019   K 5.1 06/02/2019   CL 107 (H) 06/02/2019   CO2 22 06/02/2019   Lab Results  Component Value Date   CHOL 185 06/02/2019   HDL 55 06/02/2019   LDLCALC 100 (H) 06/02/2019   TRIG 174 (H) 06/02/2019   CHOLHDL 3.1 10/12/2017   No results found for: TSH No results found for: HGBA1C   Review of Systems  Constitutional: Negative for chills, fever and unexpected weight change.  HENT: Positive for congestion and sinus pressure. Negative for ear pain, facial swelling, sneezing and sore throat.        Post nasal drip and mild cough Occasional yellow nasal discharge  Respiratory: Negative for cough, chest tightness and shortness of breath.   Cardiovascular: Negative for chest pain and palpitations.   Gastrointestinal: Negative for diarrhea, nausea and vomiting.  Allergic/Immunologic: Positive for environmental allergies.  Neurological: Negative for dizziness and headaches.  Psychiatric/Behavioral: Negative for sleep disturbance.    Patient Active Problem List   Diagnosis Date Noted  . Essential hypertension 10/12/2017  . Chronic obstructive pulmonary disease with acute exacerbation ([place]) 04/10/2017  . Simple chronic bronchitis ([place]) 06/09/2016  . Other seasonal allergic rhinitis 06/09/2016  . Cardiomyopathy ([place]) 06/06/2015  . Chronic kidney disease 06/06/2015  . CCF (congestive cardiac failure) ([place]) 06/06/2015  . 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 Acid reflux 06/06/2015  . Hypercholesteremia 06/06/2015  . Cardiac murmur 12/16/2013    Allergies  Allergen Reactions  . Sulfa Antibiotics     Past Surgical History:  Procedure Laterality Date  . COLONOSCOPY  2012   normal- cleared for 10- Iftikhar  . VAGINAL HYSTERECTOMY      Social History   Tobacco Use  . Smoking status: Former Research scientist (life sciences)  . Smokeless tobacco: Never Used  Substance Use Topics  . Alcohol use: No    Alcohol/week: 0.0 standard drinks  . Drug use: No     Medication list has been reviewed and updated.  Current Meds  Medication Sig  . amitriptyline (ELAVIL) 10 MG tablet Take 1 tablet (10 mg total) by mouth at bedtime.  Marland Kitchen atorvastatin (LIPITOR) 20 MG tablet Take 1 tablet (20 mg total) by mouth daily.  . carvedilol (COREG) 3.125 MG tablet Take 1 tablet (3.125 mg total) by mouth 2 (two) times  daily. [place]  . famotidine (PEPCID) 40 MG tablet Take 1 tablet (40 mg total) by mouth daily.  . fluticasone (FLONASE) 50 MCG/ACT nasal spray 1 puff each nostril bid  . Fluticasone-Salmeterol (WIXELA INHUB) 250-50 MCG/DOSE AEPB Inhale 1 puff into the lungs 2 (two) times daily.  . ramipril (ALTACE) 2.5 MG capsule Take 1 capsule (2.5 mg total) by mouth daily. Dr Clayborn Bigness    [facility] 2/9 Scores  08/11/2019 03/28/2019 11/26/2018 12/16/2017  PHQ - 2 Score 0 0 0 0  PHQ- 9 Score 0 0 0 4    BP Readings from Last 3 Encounters:  06/02/19 112/62  03/28/19 110/60  11/26/18 110/80    Physical Exam HENT:     Nose:     Comments: No sinus tenderness per patient  Pulmonary:     Comments: No cough or dyspnea noted during the call Neurological:     Mental Status: She is alert.  Psychiatric:        Attention and Perception: Attention normal.        Mood and Affect: Mood normal.        Speech: Speech normal.        Cognition and Memory: Cognition normal.     Wt Readings from Last 3 Encounters:  06/02/19 132 lb (59.9 kg)  03/28/19 140 lb (63.5 kg)  11/26/18 141 lb (64 kg)    There were no vitals taken for this visit.  Assessment and Plan: 1. Acute maxillary sinusitis, recurrence not specified Early symptoms but with COPD will go ahead and give a Zpak Continue Flonase - azithromycin (ZITHROMAX Z-PAK) 250 MG tablet; UAD  Dispense: 6 each; Refill: 0  2. Chronic obstructive pulmonary disease with acute exacerbation (HCC) Recommend continuing Advair Steroid taper  - predniSONE (DELTASONE) 10 MG tablet; Take 1 tablet (10 mg total) by mouth as directed for 6 days. Take 6,5,4,3,2,1 then stop  Dispense: 21 tablet; Refill: 0   Partially dictated using Editor, commissioning. Any errors are unintentional.  Halina Maidens, MD [facility]  08/11/2019

## 2019-09-13 DIAGNOSIS — M1712 Unilateral primary osteoarthritis, left knee: Secondary | ICD-10-CM | POA: Diagnosis not present

## 2019-10-18 ENCOUNTER — Other Ambulatory Visit: Payer: Self-pay

## 2019-10-18 DIAGNOSIS — J441 Chronic obstructive pulmonary disease with (acute) exacerbation: Secondary | ICD-10-CM

## 2019-10-18 MED ORDER — FLUTICASONE-SALMETEROL 250-50 MCG/DOSE IN AEPB: 1.0000 | INHALATION_SPRAY | Freq: Two times a day (BID) | RESPIRATORY_TRACT | 2 refills | Status: DC

## 2019-11-14 ENCOUNTER — Encounter: Payer: Self-pay | Admitting: Family Medicine

## 2019-11-14 ENCOUNTER — Ambulatory Visit (INDEPENDENT_AMBULATORY_CARE_PROVIDER_SITE_OTHER): Payer: Medicare HMO | Admitting: Family Medicine

## 2019-11-14 ENCOUNTER — Other Ambulatory Visit: Payer: Self-pay

## 2019-11-14 VITALS — BP 120/70 | HR 80 | Ht 65.0 in | Wt 142.0 lb

## 2019-11-14 DIAGNOSIS — J302 Other seasonal allergic rhinitis: Secondary | ICD-10-CM | POA: Diagnosis not present

## 2019-11-14 DIAGNOSIS — J441 Chronic obstructive pulmonary disease with (acute) exacerbation: Secondary | ICD-10-CM

## 2019-11-14 DIAGNOSIS — E7800 Pure hypercholesterolemia, unspecified: Secondary | ICD-10-CM | POA: Diagnosis not present

## 2019-11-14 DIAGNOSIS — F5101 Primary insomnia: Secondary | ICD-10-CM | POA: Diagnosis not present

## 2019-11-14 DIAGNOSIS — I10 Essential (primary) hypertension: Secondary | ICD-10-CM | POA: Diagnosis not present

## 2019-11-14 DIAGNOSIS — K219 Gastro-esophageal reflux disease without esophagitis: Secondary | ICD-10-CM | POA: Diagnosis not present

## 2019-11-14 DIAGNOSIS — I429 Cardiomyopathy, unspecified: Secondary | ICD-10-CM | POA: Diagnosis not present

## 2019-11-14 MED ORDER — ATORVASTATIN CALCIUM 20 MG PO TABS: 20.0000 mg | ORAL_TABLET | Freq: Every day | ORAL | 1 refills | Status: DC

## 2019-11-14 MED ORDER — FAMOTIDINE 40 MG PO TABS: 40.0000 mg | ORAL_TABLET | Freq: Every day | ORAL | 1 refills | Status: DC

## 2019-11-14 MED ORDER — CARVEDILOL 3.125 MG PO TABS: 3.1250 mg | ORAL_TABLET | Freq: Two times a day (BID) | ORAL | 1 refills | Status: DC

## 2019-11-14 MED ORDER — FLUTICASONE-SALMETEROL 250-50 MCG/DOSE IN AEPB: 1.0000 | INHALATION_SPRAY | Freq: Two times a day (BID) | RESPIRATORY_TRACT | 2 refills | Status: DC

## 2019-11-14 MED ORDER — AMITRIPTYLINE HCL 10 MG PO TABS: 10.0000 mg | ORAL_TABLET | Freq: Every day | ORAL | 1 refills | Status: DC

## 2019-11-14 MED ORDER — FLUTICASONE PROPIONATE 50 MCG/ACT NA SUSP: NASAL | 1 refills | Status: DC

## 2019-11-14 NOTE — Progress Notes (Signed)
Date:  11/14/2019   Name:  Marie Villa   DOB:  Oct 28, 1940   MRN:  [id number]   Chief Complaint: Hypertension, Hyperlipidemia, Allergic Rhinitis , Insomnia, COPD, and Gastroesophageal Reflux  Hypertension This is a chronic problem. The current episode started more than 1 year ago. The problem has been gradually improving since onset. The problem is controlled. Pertinent negatives include no anxiety, blurred vision, chest pain, headaches, malaise/fatigue, neck pain, orthopnea, palpitations, peripheral edema, PND, shortness of breath or sweats. There are no associated agents to hypertension. There are no known risk factors for coronary artery disease. Past treatments include ACE inhibitors and beta blockers. The current treatment provides moderate improvement. There are no compliance problems.  There is no history of angina, kidney disease, CAD/MI, CVA, heart failure, left ventricular hypertrophy, PVD or retinopathy. There is no history of chronic renal disease, a hypertension causing med or renovascular disease.  Hyperlipidemia This is a chronic problem. The current episode started more than 1 year ago. The problem is controlled. Recent lipid tests were reviewed and are normal. She has no history of chronic renal disease, diabetes, hypothyroidism, liver disease or obesity. There are no known factors aggravating her hyperlipidemia. Pertinent negatives include no chest pain, focal sensory loss, focal weakness, leg pain, myalgias or shortness of breath. Current antihyperlipidemic treatment includes statins. The current treatment provides mild improvement of lipids. There are no compliance problems.  Risk factors for coronary artery disease include dyslipidemia and hypertension.  Insomnia Primary symptoms: difficulty falling asleep, no malaise/fatigue.  The problem has been waxing and waning since onset.  COPD There is no chest tightness, cough, frequent throat clearing, hemoptysis, hoarse voice,  shortness of breath, sputum production or wheezing. The current episode started more than 1 year ago. The problem occurs intermittently. The problem has been waxing and waning. Pertinent negatives include no chest pain, ear pain, fever, headaches, heartburn, malaise/fatigue, myalgias, PND, postnasal drip, rhinorrhea, sore throat or sweats. Her symptoms are alleviated by beta-agonist and steroid inhaler. She reports moderate improvement on treatment. Her past medical history is significant for COPD.  Gastroesophageal Reflux She reports no abdominal pain, no belching, no chest pain, no choking, no coughing, no dysphagia, no early satiety, no globus sensation, no heartburn, no hoarse voice, no nausea, no sore throat or no wheezing. She has tried a histamine-2 antagonist for the symptoms. The treatment provided moderate relief.    Lab Results  Component Value Date   CREATININE 1.53 (H) 06/02/2019   BUN 19 06/02/2019   NA 143 06/02/2019   K 5.1 06/02/2019   CL 107 (H) 06/02/2019   CO2 22 06/02/2019   Lab Results  Component Value Date   CHOL 185 06/02/2019   HDL 55 06/02/2019   LDLCALC 100 (H) 06/02/2019   TRIG 174 (H) 06/02/2019   CHOLHDL 3.1 10/12/2017   No results found for: TSH No results found for: HGBA1C   Review of Systems  Constitutional: Negative for chills, fever and malaise/fatigue.  HENT: Negative for drooling, ear discharge, ear pain, hoarse voice, postnasal drip, rhinorrhea, sinus pain and sore throat.   Eyes: Negative for blurred vision.  Respiratory: Negative for cough, hemoptysis, sputum production, choking, shortness of breath and wheezing.   Cardiovascular: Negative for chest pain, palpitations, orthopnea, leg swelling and PND.  Gastrointestinal: Negative for abdominal pain, blood in stool, constipation, diarrhea, dysphagia, heartburn and nausea.  Endocrine: Negative for polydipsia.  Genitourinary: Negative for dysuria, frequency, hematuria and urgency.   Musculoskeletal: Negative for back  pain, myalgias and neck pain.  Skin: Negative for rash.  Allergic/Immunologic: Negative for environmental allergies.  Neurological: Negative for dizziness, focal weakness and headaches.  Hematological: Does not bruise/bleed easily.  Psychiatric/Behavioral: Negative for suicidal ideas. The patient has insomnia. The patient is not nervous/anxious.     Patient Active Problem List   Diagnosis Date Noted  . Essential hypertension 10/12/2017  . Chronic obstructive pulmonary disease with acute exacerbation ([place]) 04/10/2017  . 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 Other seasonal allergic rhinitis 06/09/2016  . Cardiomyopathy ([place]) 06/06/2015  . Chronic kidney disease 06/06/2015  . CCF (congestive cardiac failure) ([place]) 06/06/2015  . 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 Acid reflux 06/06/2015  . Hypercholesteremia 06/06/2015  . Cardiac murmur 12/16/2013    Allergies  Allergen Reactions  . Sulfa Antibiotics     Past Surgical History:  Procedure Laterality Date  . COLONOSCOPY  2012   normal- cleared for 10- [facility]  . VAGINAL HYSTERECTOMY      Social History   Tobacco Use  . Smoking status: Former Research scientist (life sciences)  . Smokeless tobacco: Never Used  Substance Use Topics  . Alcohol use: No    Alcohol/week: 0.0 standard drinks  . Drug use: No     Medication list has been reviewed and updated.  Current Meds  Medication Sig  . amitriptyline (ELAVIL) 10 MG tablet Take 1 tablet (10 mg total) by mouth at bedtime.  Marland Kitchen atorvastatin (LIPITOR) 20 MG tablet Take 1 tablet (20 mg total) by mouth daily.  . carvedilol (COREG) 3.125 MG tablet Take 1 tablet (3.125 mg total) by mouth 2 (two) times daily. [place]  . famotidine (PEPCID) 40 MG tablet Take 1 tablet (40 mg total) by mouth daily.  . fluticasone (FLONASE) 50 MCG/ACT nasal spray 1 puff each nostril bid  . Fluticasone-Salmeterol (WIXELA INHUB) 250-50 MCG/DOSE AEPB Inhale 1 puff into  the lungs 2 (two) times daily.  . ramipril (ALTACE) 2.5 MG capsule Take 1 capsule (2.5 mg total) by mouth daily. Dr Clayborn Bigness    [facility] 2/9 Scores 11/14/2019 08/11/2019 03/28/2019 11/26/2018  PHQ - 2 Score 0 0 0 0  PHQ- 9 Score 0 0 0 0    BP Readings from Last 3 Encounters:  11/14/19 120/70  06/02/19 112/62  03/28/19 110/60    Physical Exam Vitals and nursing note reviewed.  Constitutional:      Appearance: She is well-developed.  HENT:     Head: Normocephalic.     Right Ear: External ear normal.     Left Ear: External ear normal.  Eyes:     General: Lids are everted, no foreign bodies appreciated. No scleral icterus.       Left eye: No foreign body or hordeolum.     Conjunctiva/sclera: Conjunctivae normal.     Right eye: Right conjunctiva is not injected.     Left eye: Left conjunctiva is not injected.     Pupils: Pupils are equal, round, and reactive to light.  Neck:     Thyroid: No thyromegaly.     Vascular: No JVD.     Trachea: No tracheal deviation.  Cardiovascular:     Rate and Rhythm: Normal rate and regular rhythm.     Chest Wall: PMI is not displaced. No thrill.     Pulses: Normal pulses.          Carotid pulses are 2+ on the right side and 2+ on the left side.      Radial pulses are  2+ on the right side and 2+ on the left side.       Femoral pulses are 2+ on the right side and 2+ on the left side.      Popliteal pulses are 2+ on the right side and 2+ on the left side.       Dorsalis pedis pulses are 2+ on the right side and 2+ on the left side.       Posterior tibial pulses are 2+ on the right side and 2+ on the left side.     Heart sounds: Normal heart sounds, S1 normal and S2 normal. No murmur. No systolic murmur. No diastolic murmur. No friction rub. No gallop. No S3 or S4 sounds.   Pulmonary:     Effort: Pulmonary effort is normal. No respiratory distress.     Breath sounds: Normal breath sounds. No wheezing or rales.  Abdominal:     General: Bowel sounds are  normal.     Palpations: Abdomen is soft. There is no mass.     Tenderness: There is no abdominal tenderness. There is no guarding or rebound.  Musculoskeletal:        General: No tenderness. Normal range of motion.     Cervical back: Normal range of motion and neck supple.     Right lower leg: No edema.     Left lower leg: No edema.  Lymphadenopathy:     Cervical: No cervical adenopathy.  Skin:    General: Skin is warm.     Findings: No rash.  Neurological:     Mental Status: She is alert and oriented to person, place, and time.     Cranial Nerves: No cranial nerve deficit.     Deep Tendon Reflexes: Reflexes normal.  Psychiatric:        Mood and Affect: Mood is not anxious or depressed.     Wt Readings from Last 3 Encounters:  11/14/19 142 lb (64.4 kg)  06/02/19 132 lb (59.9 kg)  03/28/19 140 lb (63.5 kg)    BP 120/70   Pulse 80   Ht 5\' 5"  (1.651 m)   Wt 142 lb (64.4 kg)   BMI 23.63 kg/m   Assessment and Plan: 1. Essential hypertension Chronic.  Controlled.  Stable.  Continue carvedilol 3.1251 twice a day.  Will check comprehensive metabolic panel - carvedilol (COREG) 3.125 MG tablet; Take 1 tablet (3.125 mg total) by mouth 2 (two) times daily. [place]  Dispense: 180 tablet; Refill: 1 - Comprehensive Metabolic Panel (CMET)  2. Primary insomnia Chronic.  Controlled.  Episodic.  Patient takes on an intermittent basis amitriptyline 10 mg nightly. - amitriptyline (ELAVIL) 10 MG tablet; Take 1 tablet (10 mg total) by mouth at bedtime.  Dispense: 90 tablet; Refill: 1  3. Hypercholesteremia Chronic.  Controlled.  Stable.  Continue atorvastatin 20 mg once a day.  Will check lipid panel. - atorvastatin (LIPITOR) 20 MG tablet; Take 1 tablet (20 mg total) by mouth daily.  Dispense: 90 tablet; Refill: 1 - Lipid Panel With LDL/HDL Ratio  4. Cardiomyopathy, unspecified type [facility]) Followed with cardiology.  We will continue beta-blocker as carvedilol 3.125 twice a day. -  carvedilol (COREG) 3.125 MG tablet; Take 1 tablet (3.125 mg total) by mouth 2 (two) times daily. [place]  Dispense: 180 tablet; Refill: 1  5. Gastroesophageal reflux disease Chronic.  Controlled.  Episodic.  Will continue famotidine 40 mg once a day. - famotidine (PEPCID) 40 MG tablet; Take 1 tablet (40 mg total) by  mouth daily.  Dispense: 90 tablet; Refill: 1  6. Other seasonal allergic rhinitis Chronic.  Intermittent.  Seasonal.  Continue Flonase 15 mcg nasal spray 1 puff each nostril twice a day. - fluticasone (FLONASE) 50 MCG/ACT nasal spray; 1 puff each nostril bid  Dispense: 16 g; Refill: 1  7. Chronic obstructive pulmonary disease with acute exacerbation. (HCC) Chronic.  Controlled.  Stable.  Continue fluticasone-salmeterol inhaler 1 puff twice a day. - Fluticasone-Salmeterol (WIXELA INHUB) 250-50 MCG/DOSE AEPB; Inhale 1 puff into the lungs 2 (two) times daily.  Dispense: 60 each; Refill: 2

## 2019-11-15 LAB — COMPREHENSIVE METABOLIC PANEL
ALT: 18 IU/L (ref 0–32)
AST: 21 IU/L (ref 0–40)
Albumin/Globulin Ratio: 2 (ref 1.2–2.2)
Albumin: 4.6 g/dL (ref 3.7–4.7)
Alkaline Phosphatase: 141 IU/L — ABNORMAL HIGH (ref 39–117)
BUN/Creatinine Ratio: 14 (ref 12–28)
BUN: 22 mg/dL (ref 8–27)
Bilirubin Total: 0.4 mg/dL (ref 0.0–1.2)
CO2: 22 mmol/L (ref 20–29)
Calcium: 9.9 mg/dL (ref 8.7–10.3)
Chloride: 105 mmol/L (ref 96–106)
Creatinine, Ser: 1.58 mg/dL — ABNORMAL HIGH (ref 0.57–1.00)
GFR calc Af Amer: 35 mL/min/{1.73_m2} — ABNORMAL LOW (ref >59)
GFR calc non Af Amer: 31 mL/min/{1.73_m2} — ABNORMAL LOW (ref >59)
Globulin, Total: 2.3 g/dL (ref 1.5–4.5)
Glucose: 104 mg/dL — ABNORMAL HIGH (ref 65–99)
Potassium: 4.8 mmol/L (ref 3.5–5.2)
Sodium: 143 mmol/L (ref 134–144)
Total Protein: 6.9 g/dL (ref 6.0–8.5)

## 2019-11-15 LAB — LIPID PANEL WITH LDL/HDL RATIO
Cholesterol, Total: 200 mg/dL — ABNORMAL HIGH (ref 100–199)
HDL: 64 mg/dL (ref >39)
LDL Chol Calc (NIH): 107 mg/dL — ABNORMAL HIGH (ref 0–99)
LDL/HDL Ratio: 1.7 ratio (ref 0.0–3.2)
Triglycerides: 168 mg/dL — ABNORMAL HIGH (ref 0–149)
VLDL Cholesterol Cal: 29 mg/dL (ref 5–40)

## 2019-11-16 ENCOUNTER — Ambulatory Visit (INDEPENDENT_AMBULATORY_CARE_PROVIDER_SITE_OTHER): Payer: Medicare HMO

## 2019-11-16 ENCOUNTER — Other Ambulatory Visit: Payer: Self-pay

## 2019-11-16 VITALS — BP 106/62 | HR 101 | Temp 95.8°F | Resp 16 | Ht 65.0 in | Wt 142.2 lb

## 2019-11-16 DIAGNOSIS — Z0000 Encounter for general adult medical examination without abnormal findings: Secondary | ICD-10-CM

## 2019-11-16 NOTE — Patient Instructions (Signed)
Marie Villa , Thank you for taking time to come for your Medicare Wellness Visit. I appreciate your ongoing commitment to your health goals. Please review the following plan we discussed and let me know if I can assist you in the future.   Screening recommendations/referrals: Colonoscopy: no longer required Mammogram: done 06/09/19 Bone Density: done 05/18/13 Recommended yearly ophthalmology/optometry visit for glaucoma screening and checkup Recommended yearly dental visit for hygiene and checkup  Vaccinations: Influenza vaccine: done 06/02/19 Pneumococcal vaccine: done 06/09/16 Tdap vaccine: done 12/27/12 Shingles vaccine: done 06/02/18 & 08/15/18 Covid-19: done 09/20/19 & 10/19/19  Advanced directives: Please bring a copy of your health care power of attorney and living will to the office at your convenience.  Conditions/risks identified: Recommend increasing physical activity as tolerated up to 3 days per week  Next appointment: Please follow up in one year for your Medicare Annual Wellness visit.     Preventive Care 35 Years and Older, Female Preventive care refers to lifestyle choices and visits with your health care provider that can promote health and wellness. What does preventive care include?  A yearly physical exam. This is also called an annual well check.  Dental exams once or twice a year.  Routine eye exams. Ask your health care provider how often you should have your eyes checked.  Personal lifestyle choices, including:  Daily care of your teeth and gums.  Regular physical activity.  Eating a healthy diet.  Avoiding tobacco and drug use.  Limiting alcohol use.  Practicing safe sex.  Taking low-dose aspirin every day.  Taking vitamin and mineral supplements as recommended by your health care provider. What happens during an annual well check? The services and screenings done by your health care provider during your annual well check will depend on your age,  overall health, lifestyle risk factors, and family history of disease. Counseling  Your health care provider may ask you questions about your:  Alcohol use.  Tobacco use.  Drug use.  Emotional well-being.  Home and relationship well-being.  Sexual activity.  Eating habits.  History of falls.  Memory and ability to understand (cognition).  Work and work Statistician.  Reproductive health. Screening  You may have the following tests or measurements:  Height, weight, and BMI.  Blood pressure.  Lipid and cholesterol levels. These may be checked every 5 years, or more frequently if you are over 42 years old.  Skin check.  Lung cancer screening. You may have this screening every year starting at age 17 if you have a 30-pack-year history of smoking and currently smoke or have quit within the past 15 years.  Fecal occult blood test (FOBT) of the stool. You may have this test every year starting at age 55.  Flexible sigmoidoscopy or colonoscopy. You may have a sigmoidoscopy every 5 years or a colonoscopy every 10 years starting at age 41.  Hepatitis C blood test.  Hepatitis B blood test.  Sexually transmitted disease (STD) testing.  Diabetes screening. This is done by checking your blood sugar (glucose) after you have not eaten for a while (fasting). You may have this done every 1-3 years.  Bone density scan. This is done to screen for osteoporosis. You may have this done starting at age 72.  Mammogram. This may be done every 1-2 years. Talk to your health care provider about how often you should have regular mammograms. Talk with your health care provider about your test results, treatment options, and if necessary, the need for more  tests. Vaccines  Your health care provider may recommend certain vaccines, such as:  Influenza vaccine. This is recommended every year.  Tetanus, diphtheria, and acellular pertussis (Tdap, Td) vaccine. You may need a Td booster every 10  years.  Zoster vaccine. You may need this after age 66.  Pneumococcal 13-valent conjugate (PCV13) vaccine. One dose is recommended after age 62.  Pneumococcal polysaccharide (PPSV23) vaccine. One dose is recommended after age 22. Talk to your health care provider about which screenings and vaccines you need and how often you need them. This information is not intended to replace advice given to you by your health care provider. Make sure you discuss any questions you have with your health care provider. Document Released: 09/21/2015 Document Revised: 05/14/2016 Document Reviewed: 06/26/2015 Elsevier Interactive Patient Education  2017 [place] Prevention in the Home Falls can cause injuries. They can happen to people of all ages. There are many things you can do to make your home safe and to help prevent falls. What can I do on the outside of my home?  Regularly fix the edges of walkways and driveways and fix any cracks.  Remove anything that might make you trip as you walk through a door, such as a raised step or threshold.  Trim any bushes or trees on the path to your home.  Use bright outdoor lighting.  Clear any walking paths of anything that might make someone trip, such as rocks or tools.  Regularly check to see if handrails are loose or broken. Make sure that both sides of any steps have handrails.  Any raised decks and porches should have guardrails on the edges.  Have any leaves, snow, or ice cleared regularly.  Use sand or salt on walking paths during winter.  Clean up any spills in your garage right away. This includes oil or grease spills. What can I do in the bathroom?  Use night lights.  Install grab bars by the toilet and in the tub and shower. Do not use towel bars as grab bars.  Use non-skid mats or decals in the tub or shower.  If you need to sit down in the shower, use a plastic, non-slip stool.  Keep the floor dry. Clean up any water that  spills on the floor as soon as it happens.  Remove soap buildup in the tub or shower regularly.  Attach bath mats securely with double-sided non-slip rug tape.  Do not have throw rugs and other things on the floor that can make you trip. What can I do in the bedroom?  Use night lights.  Make sure that you have a light by your bed that is easy to reach.  Do not use any sheets or blankets that are too big for your bed. They should not hang down onto the floor.  Have a firm chair that has side arms. You can use this for support while you get dressed.  Do not have throw rugs and other things on the floor that can make you trip. What can I do in the kitchen?  Clean up any spills right away.  Avoid walking on wet floors.  Keep items that you use a lot in easy-to-reach places.  If you need to reach something above you, use a strong step stool that has a grab bar.  Keep electrical cords out of the way.  Do not use floor polish or wax that makes floors slippery. If you must use wax, use non-skid floor  wax.  Do not have throw rugs and other things on the floor that can make you trip. What can I do with my stairs?  Do not leave any items on the stairs.  Make sure that there are handrails on both sides of the stairs and use them. Fix handrails that are broken or loose. Make sure that handrails are as long as the stairways.  Check any carpeting to make sure that it is firmly attached to the stairs. Fix any carpet that is loose or worn.  Avoid having throw rugs at the top or bottom of the stairs. If you do have throw rugs, attach them to the floor with carpet tape.  Make sure that you have a light switch at the top of the stairs and the bottom of the stairs. If you do not have them, ask someone to add them for you. What else can I do to help prevent falls?  Wear shoes that:  Do not have high heels.  Have rubber bottoms.  Are comfortable and fit you well.  Are closed at the  toe. Do not wear sandals.  If you use a stepladder:  Make sure that it is fully opened. Do not climb a closed stepladder.  Make sure that both sides of the stepladder are locked into place.  Ask someone to hold it for you, if possible.  Clearly mark and make sure that you can see:  Any grab bars or handrails.  First and last steps.  Where the edge of each step is.  Use tools that help you move around (mobility aids) if they are needed. These include:  Canes.  Walkers.  Scooters.  Crutches.  Turn on the lights when you go into a dark area. Replace any light bulbs as soon as they burn out.  Set up your furniture so you have a clear path. Avoid moving your furniture around.  If any of your floors are uneven, fix them.  If there are any pets around you, be aware of where they are.  Review your medicines with your doctor. Some medicines can make you feel dizzy. This can increase your chance of falling. Ask your doctor what other things that you can do to help prevent falls. This information is not intended to replace advice given to you by your health care provider. Make sure you discuss any questions you have with your health care provider. Document Released: 06/21/2009 Document Revised: 01/31/2016 Document Reviewed: 09/29/2014 Elsevier Interactive Patient Education  2017 [employer].

## 2019-11-16 NOTE — Progress Notes (Signed)
Subjective:   Marie Villa is a 79 y.o. female who presents for an Initial Medicare Annual Wellness Visit.  Review of Systems    Cardiac Risk Factors include: advanced age (>68men, >60 women);dyslipidemia;hypertension     Objective:    Today's Vitals   11/16/19 0916  BP: 106/62  Pulse: (!) 101  Resp: 16  Temp: (!) 95.8 F (35.4 C)  TempSrc: Temporal  SpO2: 97%  Weight: 142 lb 3.2 oz (64.5 kg)  Height: 5\' 5"  (1.651 m)   Body mass index is 23.66 kg/m.  Advanced Directives 11/16/2019 06/06/2015  Does Patient Have a Medical Advance Directive? Yes Yes  Type of Paramedic of [place];Living will Living will;Healthcare Power of [place] in Chart? No - copy requested No - copy requested    Current Medications (verified) Outpatient Encounter Medications as of 11/16/2019  Medication Sig  . acetaminophen (TYLENOL) 325 MG tablet Take 325 mg by mouth as needed.  Marland Kitchen amitriptyline (ELAVIL) 10 MG tablet Take 1 tablet (10 mg total) by mouth at bedtime.  Marland Kitchen aspirin EC 81 MG tablet Take 81 mg by mouth daily.  Marland Kitchen atorvastatin (LIPITOR) 20 MG tablet Take 1 tablet (20 mg total) by mouth daily.  . Calcium Carb-Cholecalciferol (CALCIUM-VITAMIN D) 600-400 MG-UNIT TABS Take 2 tablets by mouth daily.  . carvedilol (COREG) 3.125 MG tablet Take 1 tablet (3.125 mg total) by mouth 2 (two) times daily. [place]  . cetirizine (ZYRTEC) 10 MG tablet Take 10 mg by mouth daily.  . famotidine (PEPCID) 40 MG tablet Take 1 tablet (40 mg total) by mouth daily.  . ferrous sulfate 324 MG TBEC Take 324 mg by mouth daily.  . fluticasone (FLONASE) 50 MCG/ACT nasal spray 1 puff each nostril bid  . Fluticasone-Salmeterol (WIXELA INHUB) 250-50 MCG/DOSE AEPB Inhale 1 puff into the lungs 2 (two) times daily.  . Glucos-Chond-Hyal Ac-Ca Fructo (MOVE FREE JOINT HEALTH ADVANCE PO) Take 1 tablet by mouth daily.  Marland Kitchen MEGARED OMEGA-3 KRILL OIL PO Take 1 capsule by mouth  daily.  Marland Kitchen omeprazole (PRILOSEC) 20 MG capsule Take 20 mg by mouth daily.   . ramipril (ALTACE) 2.5 MG capsule Take 1 capsule (2.5 mg total) by mouth daily. Dr Clayborn Bigness  . vitamin B-12 (CYANOCOBALAMIN) 1000 MCG tablet Take 1,000 mcg by mouth daily.   No facility-administered encounter medications on file as of 11/16/2019.    Allergies (verified) Sulfa antibiotics   History: Past Medical History:  Diagnosis Date  . Congestive heart disease ([place])   . COPD (chronic obstructive pulmonary disease) ([place])   . GERD (gastroesophageal reflux disease)   . Hyperlipidemia   . Hypertension    Past Surgical History:  Procedure Laterality Date  . CATARACT EXTRACTION, BILATERAL    . COLONOSCOPY  2012   normal- cleared for 10- Iftikhar  . VAGINAL HYSTERECTOMY     Family History  Problem Relation Age of Onset  . Breast cancer Neg Hx    Social History   Socioeconomic History  . Marital status: Married    Spouse name: Not on file  . Number of children: 2  . Years of education: Not on file  . Highest education level: Not on file  Occupational History    Comment: retired  Tobacco Use  . Smoking status: Former Smoker    Types: Cigarettes  . Smokeless tobacco: Never Used  . Tobacco comment: quit over 50 years  Substance and Sexual Activity  . Alcohol use:  No    Alcohol/week: 0.0 standard drinks  . Drug use: No  . Sexual activity: Yes  Other Topics Concern  . Not on file  Social History Narrative  . Not on file   Social Determinants of Health   Financial Resource Strain: Low Risk   . Difficulty of Paying Living Expenses: Not hard at all  Food Insecurity: No Food Insecurity  . Worried About Charity fundraiser in the Last Year: Never true  . Ran Out of Food in the Last Year: Never true  Transportation Needs: No Transportation Needs  . Lack of Transportation (Medical): No  . Lack of Transportation (Non-Medical): No  Physical Activity: Inactive  . Days of Exercise per Week: 0 days   . Minutes of Exercise per Session: 0 min  Stress: No Stress Concern Present  . Feeling of Stress : Only a little  Social Connections: Unknown  . Frequency of Communication with Friends and Family: Patient refused  . Frequency of Social Gatherings with Friends and Family: Patient refused  . Attends Religious Services: Patient refused  . Active Member of Clubs or Organizations: Patient refused  . Attends Archivist Meetings: Patient refused  . Marital Status: Married    Tobacco Counseling Counseling given: Not Answered Comment: quit over 50 years   Clinical Intake:  Pre-visit preparation completed: Yes  Pain : No/denies pain     BMI - recorded: 23.66 Nutritional Status: BMI of 19-24  Normal Nutritional Risks: None Diabetes: No  How often do you need to have someone help you when you read instructions, pamphlets, or other written materials from your doctor or pharmacy?: 1 - Never  Interpreter Needed?: No  Information entered by :: Clemetine Marker LPN   Activities of Daily Living In your present state of health, do you have any difficulty performing the following activities: 11/16/2019  Hearing? Y  Comment declines hearing aids  Vision? N  Difficulty concentrating or making decisions? N  Walking or climbing stairs? N  Dressing or bathing? N  Doing errands, shopping? N  Preparing Food and eating ? N  Using the Toilet? N  In the past six months, have you accidently leaked urine? N  Do you have problems with loss of bowel control? N  Managing your Medications? N  Managing your Finances? N  Housekeeping or managing your Housekeeping? N  Some recent data might be hidden     Immunizations and Health Maintenance Immunization History  Administered Date(s) Administered  . Fluad Quad(high Dose 65+) 06/02/2019  . Influenza,inj,Quad PF,6+ Mos 06/06/2015, 06/09/2016, 06/17/2017, 06/02/2018  . Moderna SARS-COVID-2 Vaccination 09/20/2019, 10/19/2019  . Pneumococcal  Conjugate-13 06/06/2015  . Pneumococcal Polysaccharide-23 06/09/2016  . Tdap 09/08/2010  . Zoster Recombinat (Shingrix) 06/02/2018, 08/15/2018   There are no preventive care reminders to display for this patient.  Patient Care Team: Juline Patch, MD as PCP - General (Family Medicine)  Indicate any recent Medical Services you may have received from other than [facility] providers in the past year (date may be approximate).     Assessment:   This is a routine wellness examination for [place].  Hearing/Vision screen  Hearing Screening   125Hz  250Hz  500Hz  1000Hz  2000Hz  3000Hz  4000Hz  6000Hz  8000Hz   Right ear:           Left ear:           Comments: Pt c/o mild hearing difficulty in right ear related to allergies and congestion    Vision Screening Comments: Past due  for eye exam; [facility] Dr. Wallace Going   Dietary issues and exercise activities discussed: Current Exercise Habits: The patient does not participate in regular exercise at present, Exercise limited by: orthopedic condition(s)  Goals    . Increase physical activity     Pt would like to increase physical activity and start walking again up to 3 days per week      Depression Screen PHQ 2/9 Scores 11/16/2019 11/14/2019 08/11/2019 03/28/2019 11/26/2018 12/16/2017 12/08/2016  PHQ - 2 Score 0 0 0 0 0 0 0  PHQ- 9 Score - 0 0 0 0 4 -    Fall Risk Fall Risk  11/16/2019 11/14/2019 12/16/2017 12/08/2016 12/05/2015  Falls in the past year? 0 0 No No No  Number falls in past yr: 0 - - - -  Injury with Fall? 0 - - - -  Risk for fall due to : No Fall Risks - - - -  Follow up Falls prevention discussed Falls evaluation completed - - -    FALL RISK PREVENTION PERTAINING TO THE HOME:  Any stairs in or around the home? Yes  If so, do they handrails? Yes   Home free of loose throw rugs in walkways, pet beds, electrical cords, etc? Yes  Adequate lighting in your home to reduce risk of falls? Yes   ASSISTIVE DEVICES UTILIZED TO  PREVENT FALLS:  Life alert? No  Use of a cane, walker or w/c? No  Grab bars in the bathroom? No  Shower chair or bench in shower? Yes  Elevated toilet seat or a handicapped toilet? Yes   DME ORDERS:  DME order needed?  No   TIMED UP AND GO:  Was the test performed? Yes .  Length of time to ambulate 10 feet: 5 sec.   GAIT:  Appearance of gait: Gait stead-fast and without the use of an assistive device.   Education: Fall risk prevention has been discussed.  Intervention(s) required? No    Cognitive Function:     6CIT Screen 11/16/2019  What Year? 0 points  What month? 0 points  What time? 0 points  Count back from 20 0 points  Months in reverse 0 points  Repeat phrase 2 points  Total Score 2    Screening Tests Health Maintenance  Topic Date Due  . TETANUS/TDAP  09/08/2020  . INFLUENZA VACCINE  Completed  . DEXA SCAN  Completed  . PNA vac Low Risk Adult  Completed    Qualifies for Shingles Vaccine? Up to date  Tdap: Up to date  Flu Vaccine: Up to date  Pneumococcal Vaccine: Up to date   Cancer Screenings:  Colorectal Screening: No longer required.   Mammogram: Completed 06/09/19. Repeat every year.   Bone Density: Completed 05/18/13. Results reflect  OSTEOPENIA. Repeat every 2 years. Pt declines repeat screening at this time.   Lung Cancer Screening: (Low Dose CT Chest recommended if Age 44-80 years, 30 pack-year currently smoking OR have quit w/in 15years.) does not qualify.   Additional Screening:  Hepatitis C Screening: no longer required  Vision Screening: Recommended annual ophthalmology exams for early detection of glaucoma and other disorders of the eye. Is the patient up to date with their annual eye exam?  No  Who is the provider or what is the name of the office in which the pt attends annual eye exams? [facility]  Dental Screening: Recommended annual dental exams for proper oral hygiene  Community Resource Referral:  CRR  required this visit?  No      Plan:    I have personally reviewed and addressed the Medicare Annual Wellness questionnaire and have noted the following in the patient's chart:  A. Medical and social history B. Use of alcohol, tobacco or illicit drugs  C. Current medications and supplements D. Functional ability and status E.  Nutritional status F.  Physical activity G. Advance directives H. List of other physicians I.  Hospitalizations, surgeries, and ER visits in previous 12 months J.  [place] such as hearing and vision if needed, cognitive and depression L. Referrals and appointments   In addition, I have reviewed and discussed with patient certain preventive protocols, quality metrics, and best practice recommendations. A written personalized care plan for preventive services as well as general preventive health recommendations were provided to patient.   Signed,  Clemetine Marker, LPN Nurse Health Advisor   Nurse Notes: pt doing well and appreciative of visit today.

## 2019-12-21 ENCOUNTER — Ambulatory Visit (INDEPENDENT_AMBULATORY_CARE_PROVIDER_SITE_OTHER): Payer: Medicare HMO | Admitting: Family Medicine

## 2019-12-21 ENCOUNTER — Other Ambulatory Visit: Payer: Self-pay

## 2019-12-21 ENCOUNTER — Encounter: Payer: Self-pay | Admitting: Family Medicine

## 2019-12-21 VITALS — BP 120/60 | HR 68 | Ht 65.0 in | Wt 142.0 lb

## 2019-12-21 DIAGNOSIS — J441 Chronic obstructive pulmonary disease with (acute) exacerbation: Secondary | ICD-10-CM | POA: Diagnosis not present

## 2019-12-21 DIAGNOSIS — J0100 Acute maxillary sinusitis, unspecified: Secondary | ICD-10-CM

## 2019-12-21 DIAGNOSIS — J410 Simple chronic bronchitis: Secondary | ICD-10-CM

## 2019-12-21 MED ORDER — GUAIFENESIN-CODEINE 100-10 MG/5ML PO SYRP: 5.0000 mL | ORAL_SOLUTION | Freq: Four times a day (QID) | ORAL | 0 refills | Status: DC | PRN

## 2019-12-21 MED ORDER — PREDNISONE 10 MG PO TABS: 10.0000 mg | ORAL_TABLET | Freq: Every day | ORAL | 0 refills | Status: DC

## 2019-12-21 MED ORDER — AZITHROMYCIN 250 MG PO TABS: ORAL_TABLET | ORAL | 0 refills | Status: DC

## 2019-12-21 NOTE — Progress Notes (Addendum)
Date:  12/21/2019   Name:  Marie PLAUT   DOB:  Oct 30, 1940   MRN:  [id number]   Chief Complaint: Sinusitis (congestion and yellow production)  Sinusitis This is a chronic problem. The current episode started more than 1 year ago. The problem has been waxing and waning since onset. There has been no fever. The pain is moderate. Pertinent negatives include no chills, congestion, coughing, diaphoresis, ear pain, headaches, hoarse voice, neck pain, shortness of breath, sinus pressure, sneezing, sore throat or swollen glands. Past treatments include acetaminophen. The treatment provided moderate relief.    Lab Results  Component Value Date   CREATININE 1.58 (H) 11/14/2019   BUN 22 11/14/2019   NA 143 11/14/2019   K 4.8 11/14/2019   CL 105 11/14/2019   CO2 22 11/14/2019   Lab Results  Component Value Date   CHOL 200 (H) 11/14/2019   HDL 64 11/14/2019   LDLCALC 107 (H) 11/14/2019   TRIG 168 (H) 11/14/2019   CHOLHDL 3.1 10/12/2017   No results found for: TSH No results found for: HGBA1C No results found for: WBC, HGB, HCT, MCV, PLT Lab Results  Component Value Date   ALT 18 11/14/2019   AST 21 11/14/2019   ALKPHOS 141 (H) 11/14/2019   BILITOT 0.4 11/14/2019     Review of Systems  Constitutional: Negative.  Negative for chills, diaphoresis, fatigue, fever and unexpected weight change.  HENT: Negative for congestion, ear discharge, ear pain, hoarse voice, rhinorrhea, sinus pressure, sneezing and sore throat.   Eyes: Negative for photophobia, pain, discharge, redness and itching.  Respiratory: Negative for cough, shortness of breath, wheezing and stridor.   Gastrointestinal: Negative for abdominal pain, blood in stool, constipation, diarrhea, nausea and vomiting.  Endocrine: Negative for cold intolerance, heat intolerance, polydipsia, polyphagia and polyuria.  Genitourinary: Negative for dysuria, flank pain, frequency, hematuria, menstrual problem, pelvic pain, urgency, vaginal  bleeding and vaginal discharge.  Musculoskeletal: Negative for arthralgias, back pain, myalgias and neck pain.  Skin: Negative for rash.  Allergic/Immunologic: Negative for environmental allergies and food allergies.  Neurological: Negative for dizziness, weakness, light-headedness, numbness and headaches.  Hematological: Negative for adenopathy. Does not bruise/bleed easily.  Psychiatric/Behavioral: Negative for dysphoric mood. The patient is not nervous/anxious.     Patient Active Problem List   Diagnosis Date Noted  . Left knee pain 11/25/2018  . Primary osteoarthritis of left knee 11/25/2018  . Essential hypertension 10/12/2017  . COPD (chronic obstructive pulmonary disease) ([place]) 04/10/2017  . Simple chronic bronchitis ([place]) 06/09/2016  . Other seasonal allergic rhinitis 06/09/2016  . Cardiomyopathy ([place]) 06/06/2015  . Chronic kidney disease 06/06/2015  . CCF (congestive cardiac failure) ([place]) 06/06/2015  . 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[place]) 06/06/2015  . Acid reflux 06/06/2015  . Hypercholesteremia 06/06/2015  . Cardiac murmur 12/16/2013  . SOB (shortness of breath) 12/16/2013    Allergies  Allergen Reactions  . Sulfa Antibiotics     Past Surgical History:  Procedure Laterality Date  . CATARACT EXTRACTION, BILATERAL    . COLONOSCOPY  2012   normal- cleared for 10- Iftikhar  . VAGINAL HYSTERECTOMY      Social History   Tobacco Use  . Smoking status: Former Smoker    Types: Cigarettes  . Smokeless tobacco: Never Used  . Tobacco comment: quit over 50 years  Substance Use Topics  . Alcohol use: No    Alcohol/week: 0.0 standard drinks  . Drug use: No     Medication list has been  reviewed and updated.  Current Meds  Medication Sig  . acetaminophen (TYLENOL) 325 MG tablet Take 325 mg by mouth as needed.  Marland Kitchen amitriptyline (ELAVIL) 10 MG tablet Take 1 tablet (10 mg total) by mouth at bedtime.  Marland Kitchen aspirin EC 81 MG tablet Take 81 mg by mouth daily.  Marland Kitchen  atorvastatin (LIPITOR) 20 MG tablet Take 1 tablet (20 mg total) by mouth daily.  . Calcium Carb-Cholecalciferol (CALCIUM-VITAMIN D) 600-400 MG-UNIT TABS Take 2 tablets by mouth daily.  . carvedilol (COREG) 3.125 MG tablet Take 1 tablet (3.125 mg total) by mouth 2 (two) times daily. [place]  . cetirizine (ZYRTEC) 10 MG tablet Take 10 mg by mouth daily.  . famotidine (PEPCID) 40 MG tablet Take 1 tablet (40 mg total) by mouth daily.  . ferrous sulfate 324 MG TBEC Take 324 mg by mouth daily.  . fluticasone (FLONASE) 50 MCG/ACT nasal spray 1 puff each nostril bid  . Fluticasone-Salmeterol (WIXELA INHUB) 250-50 MCG/DOSE AEPB Inhale 1 puff into the lungs 2 (two) times daily.  . Glucos-Chond-Hyal Ac-Ca Fructo (MOVE FREE JOINT HEALTH ADVANCE PO) Take 1 tablet by mouth daily.  Marland Kitchen MEGARED OMEGA-3 KRILL OIL PO Take 1 capsule by mouth daily.  Marland Kitchen omeprazole (PRILOSEC) 20 MG capsule Take 20 mg by mouth daily.   . ramipril (ALTACE) 2.5 MG capsule Take 1 capsule (2.5 mg total) by mouth daily. Dr Clayborn Bigness  . vitamin B-12 (CYANOCOBALAMIN) 1000 MCG tablet Take 1,000 mcg by mouth daily.    PHQ 2/9 Scores 12/21/2019 11/16/2019 11/14/2019 08/11/2019  PHQ - 2 Score 0 0 0 0  PHQ- 9 Score 0 - 0 0    BP Readings from Last 3 Encounters:  12/21/19 120/60  11/16/19 106/62  11/14/19 120/70    Physical Exam Vitals and nursing note reviewed.  Constitutional:      Appearance: She is well-developed.  HENT:     Head: Normocephalic.     Right Ear: Tympanic membrane, ear canal and external ear normal.     Left Ear: Tympanic membrane, ear canal and external ear normal.     Nose:     Right Sinus: Maxillary sinus tenderness present. No frontal sinus tenderness.     Left Sinus: Maxillary sinus tenderness present. No frontal sinus tenderness.     Mouth/Throat:     Lips: Pink.     Pharynx: Oropharynx is clear. Uvula midline. No oropharyngeal exudate or posterior oropharyngeal erythema.  Eyes:     General: Lids are everted,  no foreign bodies appreciated. No scleral icterus.       Left eye: No foreign body or hordeolum.     Conjunctiva/sclera: Conjunctivae normal.     Right eye: Right conjunctiva is not injected.     Left eye: Left conjunctiva is not injected.     Pupils: Pupils are equal, round, and reactive to light.  Neck:     Thyroid: No thyromegaly.     Vascular: No JVD.     Trachea: No tracheal deviation.  Cardiovascular:     Rate and Rhythm: Normal rate and regular rhythm.     Heart sounds: Normal heart sounds. No murmur. No friction rub. No gallop.   Pulmonary:     Effort: Pulmonary effort is normal. No respiratory distress.     Breath sounds: Normal breath sounds. No wheezing, rhonchi or rales.  Abdominal:     General: Bowel sounds are normal.     Palpations: Abdomen is soft. There is no mass.  Tenderness: There is no abdominal tenderness. There is no guarding or rebound.  Musculoskeletal:        General: No tenderness. Normal range of motion.     Cervical back: Normal range of motion and neck supple.  Lymphadenopathy:     Cervical: No cervical adenopathy.  Skin:    General: Skin is warm.     Findings: No rash.  Neurological:     Mental Status: She is alert and oriented to person, place, and time.     Cranial Nerves: No cranial nerve deficit.     Deep Tendon Reflexes: Reflexes normal.  Psychiatric:        Mood and Affect: Mood is not anxious or depressed.     Wt Readings from Last 3 Encounters:  12/21/19 142 lb (64.4 kg)  11/16/19 142 lb 3.2 oz (64.5 kg)  11/14/19 142 lb (64.4 kg)    BP 120/60   Pulse 68   Ht 5\' 5"  (1.651 m)   Wt 142 lb (64.4 kg)   BMI 23.63 kg/m   Assessment and Plan:   1. Acute maxillary sinusitis, recurrence not specified Acute.  Persistent.  Stable.  Continue azithromycin to 50 mg 2 today then 1 a day for 4 days. - azithromycin (ZITHROMAX) 250 MG tablet; 2 today then 1 a day for 4 days  Dispense: 6 tablet; Refill: 0  2. Chronic obstructive pulmonary  disease with acute exacerbation (HCC) Chronic.  Episodic.  Somewhat of a flare.  Stable.  Will start on prednisone once a day. - predniSONE (DELTASONE) 10 MG tablet; Take 1 tablet (10 mg total) by mouth daily with breakfast.  Dispense: 30 tablet; Refill: 0  3. Simple chronic bronchitis (HCC) Chronic.  Recurrent.  Stable.  Patient has a persistent cough with this.  We will treat with prednisone 10 mg once a day as well as Robitussin-AC 1 teaspoon every 6 hours as needed cough. - predniSONE (DELTASONE) 10 MG tablet; Take 1 tablet (10 mg total) by mouth daily with breakfast.  Dispense: 30 tablet; Refill: 0 - guaiFENesin-codeine (ROBITUSSIN AC) 100-10 MG/5ML syrup; Take 5 mLs by mouth 4 (four) times daily as needed for cough.  Dispense: 118 mL; Refill: 0

## 2020-04-16 ENCOUNTER — Encounter: Payer: Self-pay | Admitting: Family Medicine

## 2020-04-16 ENCOUNTER — Other Ambulatory Visit: Payer: Self-pay

## 2020-04-16 ENCOUNTER — Ambulatory Visit (INDEPENDENT_AMBULATORY_CARE_PROVIDER_SITE_OTHER): Payer: Medicare HMO | Admitting: Family Medicine

## 2020-04-16 VITALS — BP 110/76 | HR 72 | Ht 65.0 in | Wt 138.0 lb

## 2020-04-16 DIAGNOSIS — J441 Chronic obstructive pulmonary disease with (acute) exacerbation: Secondary | ICD-10-CM

## 2020-04-16 DIAGNOSIS — J302 Other seasonal allergic rhinitis: Secondary | ICD-10-CM | POA: Diagnosis not present

## 2020-04-16 DIAGNOSIS — J410 Simple chronic bronchitis: Secondary | ICD-10-CM

## 2020-04-16 DIAGNOSIS — J0100 Acute maxillary sinusitis, unspecified: Secondary | ICD-10-CM

## 2020-04-16 MED ORDER — PREDNISONE 10 MG PO TABS: 10.0000 mg | ORAL_TABLET | Freq: Every day | ORAL | 0 refills | Status: DC

## 2020-04-16 MED ORDER — GUAIFENESIN-CODEINE 100-10 MG/5ML PO SYRP: 5.0000 mL | ORAL_SOLUTION | Freq: Four times a day (QID) | ORAL | 0 refills | Status: DC | PRN

## 2020-04-16 MED ORDER — AZITHROMYCIN 250 MG PO TABS: ORAL_TABLET | ORAL | 0 refills | Status: DC

## 2020-04-16 MED ORDER — FLUTICASONE PROPIONATE 50 MCG/ACT NA SUSP: NASAL | 1 refills | Status: DC

## 2020-04-16 NOTE — Progress Notes (Signed)
Date:  04/16/2020   Name:  Marie Villa   DOB:  1941/03/16   MRN:  [id number]   Chief Complaint: Sinusitis (coughing and wheezing- using inhalers)  Sinusitis This is a new problem. The current episode started in the past 7 days. The problem has been waxing and waning since onset. There has been no fever. Her pain is at a severity of 0/10. She is experiencing no pain. Associated symptoms include congestion and coughing. Pertinent negatives include no chills, diaphoresis, ear pain, headaches, hoarse voice, neck pain, shortness of breath, sinus pressure, sneezing, sore throat or swollen glands. Treatments tried: otc antihistamine. The treatment provided no relief.    Lab Results  Component Value Date   CREATININE 1.58 (H) 11/14/2019   BUN 22 11/14/2019   NA 143 11/14/2019   K 4.8 11/14/2019   CL 105 11/14/2019   CO2 22 11/14/2019   Lab Results  Component Value Date   CHOL 200 (H) 11/14/2019   HDL 64 11/14/2019   LDLCALC 107 (H) 11/14/2019   TRIG 168 (H) 11/14/2019   CHOLHDL 3.1 10/12/2017   No results found for: TSH No results found for: HGBA1C No results found for: WBC, HGB, HCT, MCV, PLT Lab Results  Component Value Date   ALT 18 11/14/2019   AST 21 11/14/2019   ALKPHOS 141 (H) 11/14/2019   BILITOT 0.4 11/14/2019     Review of Systems  Constitutional: Negative.  Negative for chills, diaphoresis, fatigue, fever and unexpected weight change.  HENT: Positive for congestion. Negative for ear discharge, ear pain, hoarse voice, rhinorrhea, sinus pressure, sneezing and sore throat.   Eyes: Negative for photophobia, pain, discharge, redness and itching.  Respiratory: Positive for cough. Negative for shortness of breath, wheezing and stridor.   Gastrointestinal: Negative for abdominal pain, blood in stool, constipation, diarrhea, nausea and vomiting.  Endocrine: Negative for cold intolerance, heat intolerance, polydipsia, polyphagia and polyuria.  Genitourinary: Negative for  dysuria, flank pain, frequency, hematuria, menstrual problem, pelvic pain, urgency, vaginal bleeding and vaginal discharge.  Musculoskeletal: Negative for arthralgias, back pain, myalgias and neck pain.  Skin: Negative for rash.  Allergic/Immunologic: Negative for environmental allergies and food allergies.  Neurological: Negative for dizziness, weakness, light-headedness, numbness and headaches.  Hematological: Negative for adenopathy. Does not bruise/bleed easily.  Psychiatric/Behavioral: Negative for dysphoric mood. The patient is not nervous/anxious.     Patient Active Problem List   Diagnosis Date Noted  . Left knee pain 11/25/2018  . Primary osteoarthritis of left knee 11/25/2018  . Essential hypertension 10/12/2017  . COPD (chronic obstructive pulmonary disease) ([place]) 04/10/2017  . Simple chronic bronchitis ([place]) 06/09/2016  . Other seasonal allergic rhinitis 06/09/2016  . Cardiomyopathy ([place]) 06/06/2015  . Chronic kidney disease 06/06/2015  . CCF (congestive cardiac failure) ([place]) 06/06/2015  . 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[place]) 06/06/2015  . Acid reflux 06/06/2015  . Hypercholesteremia 06/06/2015  . Cardiac murmur 12/16/2013  . SOB (shortness of breath) 12/16/2013    Allergies  Allergen Reactions  . Sulfa Antibiotics     Past Surgical History:  Procedure Laterality Date  . CATARACT EXTRACTION, BILATERAL    . COLONOSCOPY  2012   normal- cleared for 10- Iftikhar  . VAGINAL HYSTERECTOMY      Social History   Tobacco Use  . Smoking status: Former Smoker    Types: Cigarettes  . Smokeless tobacco: Never Used  . Tobacco comment: quit over 50 years  Vaping Use  . Vaping Use: Never used  Substance  Use Topics  . Alcohol use: No    Alcohol/week: 0.0 standard drinks  . Drug use: No     Medication list has been reviewed and updated.  Current Meds  Medication Sig  . acetaminophen (TYLENOL) 325 MG tablet Take 325 mg by mouth as needed.  Marland Kitchen amitriptyline  (ELAVIL) 10 MG tablet Take 1 tablet (10 mg total) by mouth at bedtime.  Marland Kitchen atorvastatin (LIPITOR) 20 MG tablet Take 1 tablet (20 mg total) by mouth daily.  . Calcium Carb-Cholecalciferol (CALCIUM-VITAMIN D) 600-400 MG-UNIT TABS Take 2 tablets by mouth daily.  . carvedilol (COREG) 3.125 MG tablet Take 1 tablet (3.125 mg total) by mouth 2 (two) times daily. [place]  . cetirizine (ZYRTEC) 10 MG tablet Take 10 mg by mouth daily.  . famotidine (PEPCID) 40 MG tablet Take 1 tablet (40 mg total) by mouth daily.  . ferrous sulfate 324 MG TBEC Take 324 mg by mouth daily.  . fluticasone (FLONASE) 50 MCG/ACT nasal spray 1 puff each nostril bid  . Fluticasone-Salmeterol (WIXELA INHUB) 250-50 MCG/DOSE AEPB Inhale 1 puff into the lungs 2 (two) times daily.  . Glucos-Chond-Hyal Ac-Ca Fructo (MOVE FREE JOINT HEALTH ADVANCE PO) Take 1 tablet by mouth daily.  Marland Kitchen MEGARED OMEGA-3 KRILL OIL PO Take 1 capsule by mouth daily.  Marland Kitchen omeprazole (PRILOSEC) 20 MG capsule Take 20 mg by mouth daily.   . ramipril (ALTACE) 2.5 MG capsule Take 1 capsule (2.5 mg total) by mouth daily. Dr Clayborn Bigness  . vitamin B-12 (CYANOCOBALAMIN) 1000 MCG tablet Take 1,000 mcg by mouth daily.  . [DISCONTINUED] aspirin EC 81 MG tablet Take 81 mg by mouth daily.  . [DISCONTINUED] azithromycin (ZITHROMAX) 250 MG tablet 2 today then 1 a day for 4 days    PHQ 2/9 Scores 04/16/2020 12/21/2019 11/16/2019 11/14/2019  PHQ - 2 Score 0 0 0 0  PHQ- 9 Score 0 0 - 0    GAD 7 : Generalized Anxiety Score 04/16/2020 12/21/2019 11/14/2019 08/11/2019  Nervous, Anxious, on Edge 0 0 0 0  Control/stop worrying 0 0 0 0  Worry too much - different things 0 0 0 0  Trouble relaxing 0 0 0 0  Restless 0 0 0 0  Easily annoyed or irritable 0 0 0 0  Afraid - awful might happen 0 0 0 0  Total GAD 7 Score 0 0 0 0    BP Readings from Last 3 Encounters:  04/16/20 110/76  12/21/19 120/60  11/16/19 106/62    Physical Exam Vitals and nursing note reviewed.  Constitutional:       Appearance: She is well-developed.  HENT:     Head: Normocephalic.     Right Ear: External ear normal.     Left Ear: External ear normal.     Nose: Congestion present. No rhinorrhea.     Mouth/Throat:     Mouth: Mucous membranes are moist.  Eyes:     General: Lids are everted, no foreign bodies appreciated. No scleral icterus.       Left eye: No foreign body or hordeolum.     Conjunctiva/sclera: Conjunctivae normal.     Right eye: Right conjunctiva is not injected.     Left eye: Left conjunctiva is not injected.     Pupils: Pupils are equal, round, and reactive to light.  Neck:     Thyroid: No thyromegaly.     Vascular: No JVD.     Trachea: No tracheal deviation.  Cardiovascular:     Rate and  Rhythm: Normal rate and regular rhythm.     Heart sounds: Normal heart sounds. No murmur heard.  No friction rub. No gallop.   Pulmonary:     Effort: Pulmonary effort is normal. No respiratory distress.     Breath sounds: Normal breath sounds. No wheezing or rales.  Abdominal:     General: Bowel sounds are normal.     Palpations: Abdomen is soft. There is no mass.     Tenderness: There is no abdominal tenderness. There is no guarding or rebound.  Musculoskeletal:        General: No tenderness. Normal range of motion.     Cervical back: Normal range of motion and neck supple.  Lymphadenopathy:     Cervical: No cervical adenopathy.  Skin:    General: Skin is warm.     Findings: No rash.  Neurological:     Mental Status: She is alert and oriented to person, place, and time.     Cranial Nerves: No cranial nerve deficit.     Deep Tendon Reflexes: Reflexes normal.  Psychiatric:        Mood and Affect: Mood is not anxious or depressed.     Wt Readings from Last 3 Encounters:  04/16/20 138 lb (62.6 kg)  12/21/19 142 lb (64.4 kg)  11/16/19 142 lb 3.2 oz (64.5 kg)    BP 110/76   Pulse 72   Ht 5\' 5"  (1.651 m)   Wt 138 lb (62.6 kg)   SpO2 96%   BMI 22.96 kg/m   Assessment and  Plan:  1. Acute maxillary sinusitis, recurrence not specified Acute.  Persistent.  Stable.  Patient gets sinus infections about this time every year and will see phenomenology nature or secondary infection because she does not work outside the house.  We will initiate a azithromycin to 50 mg 2 today followed by 1 a day for 4 days as well as refill her prednisone and Flonase. - azithromycin (ZITHROMAX) 250 MG tablet; 2 today then 1 a day for 4 days  Dispense: 6 tablet; Refill: 0 - predniSONE (DELTASONE) 10 MG tablet; Take 1 tablet (10 mg total) by mouth daily with breakfast.  Dispense: 30 tablet; Refill: 0 - fluticasone (FLONASE) 50 MCG/ACT nasal spray; 1 puff each nostril bid  Dispense: 16 g; Refill: 1  2. Chronic obstructive pulmonary disease with acute exacerbation (HCC) Chronic.  Controlled.  Stable.  The cough is probably an exacerbation of her COPD and prednisone daily for the next 2 weeks usually handles the situation. - predniSONE (DELTASONE) 10 MG tablet; Take 1 tablet (10 mg total) by mouth daily with breakfast.  Dispense: 30 tablet; Refill: 0  3. Simple chronic bronchitis (HCC) Acute exacerbation of a chronic bronchitis/COPD.  In addition to the prednisone patient will be given symptomatic relief with Robitussin-AC 1 teaspoon daily 46 hours. - predniSONE (DELTASONE) 10 MG tablet; Take 1 tablet (10 mg total) by mouth daily with breakfast.  Dispense: 30 tablet; Refill: 0 - guaiFENesin-codeine (ROBITUSSIN AC) 100-10 MG/5ML syrup; Take 5 mLs by mouth 4 (four) times daily as needed for cough.  Dispense: 118 mL; Refill: 0  4. Other seasonal allergic rhinitis Chronic.  Recurrent.  Persistent.  Uncomplicated.  We will treat with fluticasone nasal spray 1 puff each nostril twice a day. - fluticasone (FLONASE) 50 MCG/ACT nasal spray; 1 puff each nostril bid  Dispense: 16 g; Refill: 1

## 2020-04-26 ENCOUNTER — Ambulatory Visit: Payer: Medicare HMO | Admitting: Family Medicine

## 2020-05-02 ENCOUNTER — Other Ambulatory Visit
Admission: RE | Admit: 2020-05-02 | Discharge: 2020-05-02 | Disposition: A | Discharge status: Home / Self Care | Payer: Medicare HMO | Attending: Family Medicine | Admitting: Family Medicine

## 2020-05-02 ENCOUNTER — Encounter: Payer: Self-pay | Admitting: Family Medicine

## 2020-05-02 ENCOUNTER — Other Ambulatory Visit: Payer: Self-pay

## 2020-05-02 ENCOUNTER — Ambulatory Visit (INDEPENDENT_AMBULATORY_CARE_PROVIDER_SITE_OTHER): Payer: Medicare HMO | Admitting: Family Medicine

## 2020-05-02 VITALS — BP 120/64 | HR 72 | Ht 65.0 in | Wt 137.0 lb

## 2020-05-02 DIAGNOSIS — J302 Other seasonal allergic rhinitis: Secondary | ICD-10-CM

## 2020-05-02 DIAGNOSIS — I429 Cardiomyopathy, unspecified: Secondary | ICD-10-CM | POA: Diagnosis not present

## 2020-05-02 DIAGNOSIS — F5101 Primary insomnia: Secondary | ICD-10-CM

## 2020-05-02 DIAGNOSIS — K219 Gastro-esophageal reflux disease without esophagitis: Secondary | ICD-10-CM | POA: Diagnosis not present

## 2020-05-02 DIAGNOSIS — I10 Essential (primary) hypertension: Secondary | ICD-10-CM

## 2020-05-02 DIAGNOSIS — N1832 Chronic kidney disease, stage 3b: Secondary | ICD-10-CM

## 2020-05-02 DIAGNOSIS — Z862 Personal history of diseases of the blood and blood-forming organs and certain disorders involving the immune mechanism: Secondary | ICD-10-CM | POA: Diagnosis not present

## 2020-05-02 DIAGNOSIS — E7800 Pure hypercholesterolemia, unspecified: Secondary | ICD-10-CM | POA: Diagnosis not present

## 2020-05-02 DIAGNOSIS — J0100 Acute maxillary sinusitis, unspecified: Secondary | ICD-10-CM | POA: Diagnosis not present

## 2020-05-02 DIAGNOSIS — I129 Hypertensive chronic kidney disease with stage 1 through stage 4 chronic kidney disease, or unspecified chronic kidney disease: Secondary | ICD-10-CM | POA: Diagnosis not present

## 2020-05-02 DIAGNOSIS — J441 Chronic obstructive pulmonary disease with (acute) exacerbation: Secondary | ICD-10-CM

## 2020-05-02 LAB — COMPREHENSIVE METABOLIC PANEL
ALT: 24 U/L (ref 0–44)
AST: 23 U/L (ref 15–41)
Albumin: 3.7 g/dL (ref 3.5–5.0)
Alkaline Phosphatase: 90 U/L (ref 38–126)
Anion gap: 11 (ref 5–15)
BUN: 24 mg/dL — ABNORMAL HIGH (ref 8–23)
CO2: 25 mmol/L (ref 22–32)
Calcium: 8.9 mg/dL (ref 8.9–10.3)
Chloride: 103 mmol/L (ref 98–111)
Creatinine, Ser: 1.38 mg/dL — ABNORMAL HIGH (ref 0.44–1.00)
GFR calc Af Amer: 41 mL/min — ABNORMAL LOW (ref >60)
GFR calc non Af Amer: 36 mL/min — ABNORMAL LOW (ref >60)
Glucose, Bld: 107 mg/dL — ABNORMAL HIGH (ref 70–99)
Potassium: 3.7 mmol/L (ref 3.5–5.1)
Sodium: 139 mmol/L (ref 135–145)
Total Bilirubin: 0.7 mg/dL (ref 0.3–1.2)
Total Protein: 7.2 g/dL (ref 6.5–8.1)

## 2020-05-02 LAB — CBC WITH DIFFERENTIAL/PLATELET
Abs Immature Granulocytes: 0.04 10*3/uL (ref 0.00–0.07)
Basophils Absolute: 0 10*3/uL (ref 0.0–0.1)
Basophils Relative: 1 %
Eosinophils Absolute: 0.2 10*3/uL (ref 0.0–0.5)
Eosinophils Relative: 3 %
HCT: 38.9 % (ref 36.0–46.0)
Hemoglobin: 12.5 g/dL (ref 12.0–15.0)
Immature Granulocytes: 1 %
Lymphocytes Relative: 36 %
Lymphs Abs: 2.4 10*3/uL (ref 0.7–4.0)
MCH: 32.1 pg (ref 26.0–34.0)
MCHC: 32.1 g/dL (ref 30.0–36.0)
MCV: 99.7 fL (ref 80.0–100.0)
Monocytes Absolute: 0.8 10*3/uL (ref 0.1–1.0)
Monocytes Relative: 12 %
Neutro Abs: 3.1 10*3/uL (ref 1.7–7.7)
Neutrophils Relative %: 47 %
Platelets: 226 10*3/uL (ref 150–400)
RBC: 3.9 MIL/uL (ref 3.87–5.11)
RDW: 13.1 % (ref 11.5–15.5)
WBC: 6.5 10*3/uL (ref 4.0–10.5)
nRBC: 0 % (ref 0.0–0.2)

## 2020-05-02 MED ORDER — FAMOTIDINE 40 MG PO TABS: 40.0000 mg | ORAL_TABLET | Freq: Every day | ORAL | 1 refills | Status: DC

## 2020-05-02 MED ORDER — ATORVASTATIN CALCIUM 20 MG PO TABS: 20.0000 mg | ORAL_TABLET | Freq: Every day | ORAL | 1 refills | Status: DC

## 2020-05-02 MED ORDER — AMITRIPTYLINE HCL 10 MG PO TABS: 10.0000 mg | ORAL_TABLET | Freq: Every day | ORAL | 1 refills | Status: DC

## 2020-05-02 MED ORDER — FLUTICASONE-SALMETEROL 250-50 MCG/DOSE IN AEPB: 1.0000 | INHALATION_SPRAY | Freq: Two times a day (BID) | RESPIRATORY_TRACT | 5 refills | Status: DC

## 2020-05-02 MED ORDER — CETIRIZINE HCL 10 MG PO TABS: 10.0000 mg | ORAL_TABLET | Freq: Every day | ORAL | 1 refills | Status: DC

## 2020-05-02 MED ORDER — FLUTICASONE PROPIONATE 50 MCG/ACT NA SUSP: NASAL | 1 refills | Status: DC

## 2020-05-02 MED ORDER — OMEPRAZOLE 20 MG PO CPDR
20.0000 mg | DELAYED_RELEASE_CAPSULE | Freq: Every day | ORAL | 1 refills | Status: DC
Start: 2020-05-02 — End: 2020-10-22

## 2020-05-02 NOTE — Progress Notes (Signed)
Date:  05/02/2020   Name:  Marie Villa   DOB:  1941/08/05   MRN:  [id number]   Chief Complaint: Hyperlipidemia, COPD, Allergic Rhinitis , Gastroesophageal Reflux, and Insomnia  Hyperlipidemia This is a chronic problem. The current episode started more than 1 year ago. The problem is controlled. Recent lipid tests were reviewed and are normal. Exacerbating diseases include hypothyroidism. She has no history of chronic renal disease, diabetes, obesity or nephrotic syndrome. Factors aggravating her hyperlipidemia include thiazides. Pertinent negatives include no chest pain, myalgias or shortness of breath. Current antihyperlipidemic treatment includes statins. The current treatment provides moderate improvement of lipids. There are no compliance problems.  Risk factors for coronary artery disease include dyslipidemia, hypertension and post-menopausal.  COPD There is no chest tightness, cough, difficulty breathing, frequent throat clearing, hemoptysis, hoarse voice, shortness of breath, sputum production or wheezing. This is a chronic problem. The current episode started more than 1 year ago. The problem occurs rarely. The problem has been gradually improving. Pertinent negatives include no appetite change, chest pain, dyspnea on exertion, ear congestion, ear pain, fever, headaches, heartburn, malaise/fatigue, myalgias, nasal congestion, orthopnea, PND, postnasal drip, rhinorrhea, sneezing, sore throat, sweats, trouble swallowing or weight loss. She reports moderate improvement on treatment. Her past medical history is significant for COPD.  Gastroesophageal Reflux She reports no abdominal pain, no chest pain, no choking, no coughing, no dysphagia, no heartburn, no hoarse voice, no nausea, no sore throat or no wheezing. The problem has been gradually improving. Pertinent negatives include no anemia, fatigue, melena, muscle weakness, orthopnea or weight loss. She has tried a PPI for the symptoms. The  treatment provided moderate relief.  Insomnia Primary symptoms: no fragmented sleep, no sleep disturbance, no difficulty falling asleep, no somnolence, no frequent awakening, no premature morning awakening, no malaise/fatigue, no napping.    URI  This is a chronic (for allergic rhinitis) problem. The current episode started more than 1 year ago. The problem has been gradually improving. Pertinent negatives include no abdominal pain, chest pain, congestion, coughing, diarrhea, dysuria, ear pain, headaches, nausea, rash, rhinorrhea, sneezing, sore throat, vomiting or wheezing. Treatments tried: antihistamine. The treatment provided moderate relief.  Anemia Presents for follow-up (iron deficiency) visit. There has been no abdominal pain, bruising/bleeding easily, fever, light-headedness, malaise/fatigue, palpitations or weight loss. Signs of blood loss that are not present include melena and vaginal bleeding. Past medical history includes hypothyroidism. There is no history of chronic renal disease. There are no compliance problems.     Lab Results  Component Value Date   CREATININE 1.58 (H) 11/14/2019   BUN 22 11/14/2019   NA 143 11/14/2019   K 4.8 11/14/2019   CL 105 11/14/2019   CO2 22 11/14/2019   Lab Results  Component Value Date   CHOL 200 (H) 11/14/2019   HDL 64 11/14/2019   LDLCALC 107 (H) 11/14/2019   TRIG 168 (H) 11/14/2019   CHOLHDL 3.1 10/12/2017   No results found for: TSH No results found for: HGBA1C No results found for: WBC, HGB, HCT, MCV, PLT Lab Results  Component Value Date   ALT 18 11/14/2019   AST 21 11/14/2019   ALKPHOS 141 (H) 11/14/2019   BILITOT 0.4 11/14/2019     Review of Systems  Constitutional: Negative.  Negative for appetite change, chills, fatigue, fever, malaise/fatigue, unexpected weight change and weight loss.  HENT: Negative for congestion, ear discharge, ear pain, hoarse voice, postnasal drip, rhinorrhea, sinus pressure, sneezing, sore throat  and trouble  swallowing.   Eyes: Negative for photophobia, pain, discharge, redness and itching.  Respiratory: Negative for cough, hemoptysis, sputum production, choking, shortness of breath, wheezing and stridor.   Cardiovascular: Negative for chest pain, dyspnea on exertion, palpitations and PND.  Gastrointestinal: Negative for abdominal pain, blood in stool, constipation, diarrhea, dysphagia, heartburn, melena, nausea and vomiting.  Endocrine: Negative for cold intolerance, heat intolerance, polydipsia, polyphagia and polyuria.  Genitourinary: Negative for dysuria, flank pain, frequency, hematuria, menstrual problem, pelvic pain, urgency, vaginal bleeding and vaginal discharge.  Musculoskeletal: Negative for arthralgias, back pain, myalgias and muscle weakness.  Skin: Negative for rash.  Allergic/Immunologic: Negative for environmental allergies and food allergies.  Neurological: Negative for dizziness, weakness, light-headedness, numbness and headaches.  Hematological: Negative for adenopathy. Does not bruise/bleed easily.  Psychiatric/Behavioral: Negative for dysphoric mood and sleep disturbance. The patient has insomnia. The patient is not nervous/anxious.     Patient Active Problem List   Diagnosis Date Noted  . Left knee pain 11/25/2018  . Primary osteoarthritis of left knee 11/25/2018  . Essential hypertension 10/12/2017  . COPD (chronic obstructive pulmonary disease) ([place]) 04/10/2017  . Simple chronic bronchitis ([place]) 06/09/2016  . Other seasonal allergic rhinitis 06/09/2016  . Cardiomyopathy ([place]) 06/06/2015  . Chronic kidney disease 06/06/2015  . CCF (congestive cardiac failure) ([place]) 06/06/2015  . 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[place]) 06/06/2015  . Acid reflux 06/06/2015  . Hypercholesteremia 06/06/2015  . Cardiac murmur 12/16/2013  . SOB (shortness of breath) 12/16/2013    Allergies  Allergen Reactions  . Sulfa Antibiotics     Past Surgical History:  Procedure  Laterality Date  . CATARACT EXTRACTION, BILATERAL    . COLONOSCOPY  2012   normal- cleared for 10- Iftikhar  . VAGINAL HYSTERECTOMY      Social History   Tobacco Use  . Smoking status: Former Smoker    Types: Cigarettes  . Smokeless tobacco: Never Used  . Tobacco comment: quit over 50 years  Vaping Use  . Vaping Use: Never used  Substance Use Topics  . Alcohol use: No    Alcohol/week: 0.0 standard drinks  . Drug use: No     Medication list has been reviewed and updated.  Current Meds  Medication Sig  . acetaminophen (TYLENOL) 325 MG tablet Take 325 mg by mouth as needed.  Marland Kitchen amitriptyline (ELAVIL) 10 MG tablet Take 1 tablet (10 mg total) by mouth at bedtime.  Marland Kitchen atorvastatin (LIPITOR) 20 MG tablet Take 1 tablet (20 mg total) by mouth daily.  . Calcium Carb-Cholecalciferol (CALCIUM-VITAMIN D) 600-400 MG-UNIT TABS Take 2 tablets by mouth daily.  . carvedilol (COREG) 3.125 MG tablet Take 1 tablet (3.125 mg total) by mouth 2 (two) times daily. [place]  . cetirizine (ZYRTEC) 10 MG tablet Take 10 mg by mouth daily.  . famotidine (PEPCID) 40 MG tablet Take 1 tablet (40 mg total) by mouth daily.  . ferrous sulfate 324 MG TBEC Take 324 mg by mouth daily.  . fluticasone (FLONASE) 50 MCG/ACT nasal spray 1 puff each nostril bid  . Fluticasone-Salmeterol (WIXELA INHUB) 250-50 MCG/DOSE AEPB Inhale 1 puff into the lungs 2 (two) times daily.  . Glucos-Chond-Hyal Ac-Ca Fructo (MOVE FREE JOINT HEALTH ADVANCE PO) Take 1 tablet by mouth daily.  Marland Kitchen MEGARED OMEGA-3 KRILL OIL PO Take 1 capsule by mouth daily.  Marland Kitchen omeprazole (PRILOSEC) 20 MG capsule Take 20 mg by mouth daily.   . ramipril (ALTACE) 2.5 MG capsule Take 1 capsule (2.5 mg total) by mouth daily. Dr Clayborn Bigness  .  vitamin B-12 (CYANOCOBALAMIN) 1000 MCG tablet Take 1,000 mcg by mouth daily.    PHQ 2/9 Scores 05/02/2020 04/16/2020 12/21/2019 11/16/2019  PHQ - 2 Score 0 0 0 0  PHQ- 9 Score 0 0 0 -    GAD 7 : Generalized Anxiety Score 05/02/2020  04/16/2020 12/21/2019 11/14/2019  Nervous, Anxious, on Edge 0 0 0 0  Control/stop worrying 0 0 0 0  Worry too much - different things 0 0 0 0  Trouble relaxing 0 0 0 0  Restless 0 0 0 0  Easily annoyed or irritable 0 0 0 0  Afraid - awful might happen 0 0 0 0  Total GAD 7 Score 0 0 0 0    BP Readings from Last 3 Encounters:  05/02/20 120/64  04/16/20 110/76  12/21/19 120/60    Physical Exam Vitals and nursing note reviewed.  Constitutional:      General: She is not in acute distress.    Appearance: She is not diaphoretic.  HENT:     Head: Normocephalic and atraumatic.     Right Ear: External ear normal.     Left Ear: External ear normal.     Nose: Nose normal.  Eyes:     General:        Right eye: No discharge.        Left eye: No discharge.     Conjunctiva/sclera: Conjunctivae normal.     Pupils: Pupils are equal, round, and reactive to light.  Neck:     Thyroid: No thyromegaly.     Vascular: No JVD.  Cardiovascular:     Rate and Rhythm: Normal rate and regular rhythm.     Heart sounds: Normal heart sounds. No murmur heard.  No friction rub. No gallop.   Pulmonary:     Effort: Pulmonary effort is normal.     Breath sounds: Normal breath sounds. No decreased breath sounds, wheezing, rhonchi or rales.  Abdominal:     General: Bowel sounds are normal.     Palpations: Abdomen is soft. There is no mass.     Tenderness: There is no abdominal tenderness. There is no guarding.  Musculoskeletal:        General: Normal range of motion.     Cervical back: Normal range of motion and neck supple.  Lymphadenopathy:     Cervical: No cervical adenopathy.  Skin:    General: Skin is warm and dry.  Neurological:     Mental Status: She is alert.     Deep Tendon Reflexes: Reflexes are normal and symmetric.     Wt Readings from Last 3 Encounters:  05/02/20 137 lb (62.1 kg)  04/16/20 138 lb (62.6 kg)  12/21/19 142 lb (64.4 kg)    BP 120/64   Pulse 72   Ht 5\' 5"  (1.651 m)    Wt 137 lb (62.1 kg)   BMI 22.80 kg/m   Assessment and Plan: 1. Primary insomnia Chronic.  Controlled.  Stable.  Continue amitriptyline 10 mg 1 nightly. - amitriptyline (ELAVIL) 10 MG tablet; Take 1 tablet (10 mg total) by mouth at bedtime.  Dispense: 90 tablet; Refill: 1  2. Hypercholesteremia Chronic.  Controlled.  Stable.  Continue atorvastatin 20 mg 1 a day.  Review of past lipid panel was sufficient at current level of control and will recheck in 6 months. - atorvastatin (LIPITOR) 20 MG tablet; Take 1 tablet (20 mg total) by mouth daily.  Dispense: 90 tablet; Refill: 1  3. Gastroesophageal reflux disease .  Controlled.  Stable.  Continue famotidine 40 mg once a day.  Patient is asymptomatic on current therapy. - famotidine (PEPCID) 40 MG tablet; Take 1 tablet (40 mg total) by mouth daily.  Dispense: 90 tablet; Refill: 1  4. Acute maxillary sinusitis, recurrence not specified Chronic.  Controlled.  Stable.  Continue fluticasone 50 mcg nasal spray 1 puff each nostril twice a day. - fluticasone (FLONASE) 50 MCG/ACT nasal spray; 1 puff each nostril bid  Dispense: 16 g; Refill: 1  5. Other seasonal allergic rhinitis As noted above - fluticasone (FLONASE) 50 MCG/ACT nasal spray; 1 puff each nostril bid  Dispense: 16 g; Refill: 1  6. Chronic obstructive pulmonary disease with acute exacerbation ([place]) .  Controlled.  Stable.  Continue Wixela inhaler at current dosing of 1 puff twice a day. - Fluticasone-Salmeterol (WIXELA INHUB) 250-50 MCG/DOSE AEPB; Inhale 1 puff into the lungs 2 (two) times daily.  Dispense: 60 each; Refill: 5  7. Essential hypertension Chronic.  Controlled.  Stable.  Patient is followed by cardiology and we will obtain a CMP for current level of control. - Comprehensive metabolic panel  8. Cardiomyopathy, unspecified type ([place]) As noted above  9. History of anemia On review of patient's history patient does have a history of anemia during her hospitalization.  I  do not know the background to this but she has been taking over-the-counter iron and we will assess current level of control in nature of the anemia with CBC with differential and indices. - CBC with Differential/Platelet  10. Stage 3b chronic kidney disease As noted patient has on review of CMP CKD of a mild nature and we will continue to monitor with appropriate labs and treat aggressively her blood pressure and other issues. - Comprehensive metabolic panel

## 2020-05-09 ENCOUNTER — Other Ambulatory Visit: Payer: Self-pay | Admitting: Internal Medicine

## 2020-05-09 DIAGNOSIS — I10 Essential (primary) hypertension: Secondary | ICD-10-CM

## 2020-05-09 DIAGNOSIS — I429 Cardiomyopathy, unspecified: Secondary | ICD-10-CM

## 2020-05-09 NOTE — Telephone Encounter (Signed)
Requested medication (s) are due for refill today: yes  Requested medication (s) are on the active medication list: yes  Last refill:  02/09/20  #90 0 refills  Future visit scheduled: No  Notes to clinic:  Cr 1.38     Requested Prescriptions  Pending Prescriptions Disp Refills   ramipril (ALTACE) 2.5 MG capsule [Pharmacy Med Name: RAMIPRIL 2.5MG  CAPSULES] 90 capsule 1    Sig: TAKE 1 CAPSULE(2.5 MG) BY MOUTH EVERY DAY      Cardiovascular:  ACE Inhibitors Failed - 05/09/2020 12:55 PM      Failed - Cr in normal range and within 180 days    Creatinine, Ser  Date Value Ref Range Status  05/02/2020 1.38 (H) 0.44 - 1.00 mg/dL Final          Passed - K in normal range and within 180 days    Potassium  Date Value Ref Range Status  05/02/2020 3.7 3.5 - 5.1 mmol/L Final          Passed - Patient is not pregnant      Passed - Last BP in normal range    BP Readings from Last 1 Encounters:  05/02/20 120/64          Passed - Valid encounter within last 6 months    Recent Outpatient Visits           1 week ago History of anemia   [facility] Juline Patch, MD   3 weeks ago Acute maxillary sinusitis, recurrence not specified   [facility] Juline Patch, MD   4 months ago Acute maxillary sinusitis, recurrence not specified   [facility] Juline Patch, MD   5 months ago Essential hypertension   [facility] Juline Patch, MD   9 months ago Acute maxillary sinusitis, recurrence not specified   [facility] Glean Hess, MD

## 2020-05-16 ENCOUNTER — Other Ambulatory Visit: Payer: Self-pay | Admitting: Family Medicine

## 2020-05-16 DIAGNOSIS — Z1231 Encounter for screening mammogram for malignant neoplasm of breast: Secondary | ICD-10-CM

## 2020-06-07 ENCOUNTER — Telehealth: Payer: Self-pay

## 2020-06-07 NOTE — Telephone Encounter (Signed)
Pt called stating she has tried to pick up her Altace that was suppose to be sent in by Dr Clayborn Bigness 3 times at [facility]. I called [facility] office and they said they sent in on Sept 13th for 70 pills. [facility] says no they didn't receive it. I called in number [phone] to get her through until [facility] sees her, then he will take over again

## 2020-06-13 ENCOUNTER — Ambulatory Visit
Admission: RE | Admit: 2020-06-13 | Discharge: 2020-06-13 | Disposition: A | Discharge status: Home / Self Care | Payer: Medicare HMO | Source: Ambulatory Visit | Attending: Family Medicine | Admitting: Family Medicine

## 2020-06-13 ENCOUNTER — Other Ambulatory Visit: Payer: Self-pay

## 2020-06-13 ENCOUNTER — Ambulatory Visit (INDEPENDENT_AMBULATORY_CARE_PROVIDER_SITE_OTHER): Payer: Medicare HMO

## 2020-06-13 DIAGNOSIS — Z23 Encounter for immunization: Secondary | ICD-10-CM

## 2020-06-13 DIAGNOSIS — Z1231 Encounter for screening mammogram for malignant neoplasm of breast: Secondary | ICD-10-CM | POA: Insufficient documentation

## 2020-06-27 DIAGNOSIS — R0609 Other forms of dyspnea: Secondary | ICD-10-CM | POA: Diagnosis not present

## 2020-06-27 DIAGNOSIS — I10 Essential (primary) hypertension: Secondary | ICD-10-CM | POA: Diagnosis not present

## 2020-06-27 DIAGNOSIS — I6523 Occlusion and stenosis of bilateral carotid arteries: Secondary | ICD-10-CM | POA: Diagnosis not present

## 2020-06-27 DIAGNOSIS — N1832 Chronic kidney disease, stage 3b: Secondary | ICD-10-CM | POA: Diagnosis not present

## 2020-06-27 DIAGNOSIS — J42 Unspecified chronic bronchitis: Secondary | ICD-10-CM | POA: Diagnosis not present

## 2020-06-27 DIAGNOSIS — E7800 Pure hypercholesterolemia, unspecified: Secondary | ICD-10-CM | POA: Diagnosis not present

## 2020-06-27 DIAGNOSIS — I509 Heart failure, unspecified: Secondary | ICD-10-CM | POA: Diagnosis not present

## 2020-06-27 DIAGNOSIS — I429 Cardiomyopathy, unspecified: Secondary | ICD-10-CM | POA: Diagnosis not present

## 2020-07-08 ENCOUNTER — Other Ambulatory Visit: Payer: Self-pay | Admitting: Family Medicine

## 2020-07-08 DIAGNOSIS — J302 Other seasonal allergic rhinitis: Secondary | ICD-10-CM

## 2020-07-08 DIAGNOSIS — J0100 Acute maxillary sinusitis, unspecified: Secondary | ICD-10-CM

## 2020-07-08 NOTE — Telephone Encounter (Signed)
Requested Prescriptions  Pending Prescriptions Disp Refills  . fluticasone (FLONASE) 50 MCG/ACT nasal spray [Pharmacy Med Name: FLUTICASONE 50MCG NAS SP (120SP) RX] 16 g 1    Sig: SHAKE LIQUID AND USE 1 SPRAY IN EACH NOSTRIL TWICE DAILY     Ear, Nose, and Throat: Nasal Preparations - Corticosteroids Passed - 07/08/2020  4:45 PM      Passed - Valid encounter within last 12 months    Recent Outpatient Visits          2 months ago History of anemia   [facility] Juline Patch, MD   2 months ago Acute maxillary sinusitis, recurrence not specified   [facility] Juline Patch, MD   6 months ago Acute maxillary sinusitis, recurrence not specified   [facility] Juline Patch, MD   7 months ago Essential hypertension   [place], [place]   11 months ago Acute maxillary sinusitis, recurrence not specified   [facility] Glean Hess, MD

## 2020-08-07 ENCOUNTER — Other Ambulatory Visit: Payer: Self-pay

## 2020-08-07 ENCOUNTER — Encounter: Payer: Self-pay | Admitting: Internal Medicine

## 2020-08-07 ENCOUNTER — Telehealth: Payer: Self-pay

## 2020-08-07 ENCOUNTER — Ambulatory Visit (INDEPENDENT_AMBULATORY_CARE_PROVIDER_SITE_OTHER): Payer: Medicare HMO | Admitting: Internal Medicine

## 2020-08-07 VITALS — BP 102/64 | HR 84 | Temp 97.3°F | Ht 65.0 in | Wt 139.0 lb

## 2020-08-07 DIAGNOSIS — J0100 Acute maxillary sinusitis, unspecified: Secondary | ICD-10-CM | POA: Diagnosis not present

## 2020-08-07 DIAGNOSIS — N1832 Chronic kidney disease, stage 3b: Secondary | ICD-10-CM | POA: Diagnosis not present

## 2020-08-07 DIAGNOSIS — J42 Unspecified chronic bronchitis: Secondary | ICD-10-CM | POA: Diagnosis not present

## 2020-08-07 MED ORDER — PREDNISONE 10 MG PO TABS: 10.0000 mg | ORAL_TABLET | ORAL | 0 refills | Status: AC

## 2020-08-07 MED ORDER — AZITHROMYCIN 250 MG PO TABS: ORAL_TABLET | ORAL | 0 refills | Status: AC

## 2020-08-07 MED ORDER — GUAIFENESIN-CODEINE 100-10 MG/5ML PO SYRP: 5.0000 mL | ORAL_SOLUTION | Freq: Three times a day (TID) | ORAL | 0 refills | Status: DC | PRN

## 2020-08-07 NOTE — Telephone Encounter (Signed)
Copied from [place] [id number]. Topic: General - Inquiry >> Aug 07, 2020  7:28 AM Lennox Solders wrote: Reason for CRM: Pt is calling and would like tara to call her concerning her wheezing. Pt decline to talk with triage nurse first.

## 2020-08-07 NOTE — Telephone Encounter (Signed)
Patient scheduled for appt today at 11:20

## 2020-08-07 NOTE — Progress Notes (Signed)
Date:  08/07/2020   Name:  Marie Villa   DOB:  Jan 20, 1941   MRN:  [id number]   Chief Complaint: Sinusitis (X2-3 days, wheezing, cough (yellow mucous), pressure behind eyes ) and COPD  Sinusitis This is a recurrent problem. The current episode started in the past 7 days. The problem has been gradually worsening since onset. There has been no fever. Associated symptoms include congestion, coughing, headaches, shortness of breath and sinus pressure. Pertinent negatives include no chills or sore throat. Past treatments include spray decongestants (flonase).  Cough This is a chronic problem. The problem has been unchanged. The cough is non-productive. Associated symptoms include headaches, shortness of breath and wheezing. Pertinent negatives include no chest pain, chills, fever or sore throat. She has tried a beta-agonist inhaler and steroid inhaler for the symptoms. The treatment provided mild relief. Her past medical history is significant for COPD.    Lab Results  Component Value Date   CREATININE 1.38 (H) 05/02/2020   BUN 24 (H) 05/02/2020   NA 139 05/02/2020   K 3.7 05/02/2020   CL 103 05/02/2020   CO2 25 05/02/2020   Lab Results  Component Value Date   CHOL 200 (H) 11/14/2019   HDL 64 11/14/2019   LDLCALC 107 (H) 11/14/2019   TRIG 168 (H) 11/14/2019   CHOLHDL 3.1 10/12/2017   No results found for: TSH No results found for: HGBA1C Lab Results  Component Value Date   WBC 6.5 05/02/2020   HGB 12.5 05/02/2020   HCT 38.9 05/02/2020   MCV 99.7 05/02/2020   PLT 226 05/02/2020   Lab Results  Component Value Date   ALT 24 05/02/2020   AST 23 05/02/2020   ALKPHOS 90 05/02/2020   BILITOT 0.7 05/02/2020     Review of Systems  Constitutional: Negative for chills, fatigue and fever.  HENT: Positive for congestion and sinus pressure. Negative for sore throat and trouble swallowing.   Respiratory: Positive for cough, shortness of breath and wheezing. Negative for chest  tightness.   Cardiovascular: Negative for chest pain, palpitations and leg swelling.  Neurological: Positive for headaches.    Patient Active Problem List   Diagnosis Date Noted  . Left knee pain 11/25/2018  . Primary osteoarthritis of left knee 11/25/2018  . Essential hypertension 10/12/2017  . COPD (chronic obstructive pulmonary disease) ([place]) 04/10/2017  . Simple chronic bronchitis ([place]) 06/09/2016  . Other seasonal allergic rhinitis 06/09/2016  . Cardiomyopathy ([place]) 06/06/2015  . Chronic kidney disease 06/06/2015  . CCF (congestive cardiac failure) ([place]) 06/06/2015  . 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[place]) 06/06/2015  . Acid reflux 06/06/2015  . Hypercholesteremia 06/06/2015  . Cardiac murmur 12/16/2013  . SOB (shortness of breath) 12/16/2013    Allergies  Allergen Reactions  . Sulfa Antibiotics     Past Surgical History:  Procedure Laterality Date  . CATARACT EXTRACTION, BILATERAL    . COLONOSCOPY  2012   normal- cleared for 10- Iftikhar  . VAGINAL HYSTERECTOMY      Social History   Tobacco Use  . Smoking status: Former Smoker    Types: Cigarettes  . Smokeless tobacco: Never Used  . Tobacco comment: quit over 50 years  Vaping Use  . Vaping Use: Never used  Substance Use Topics  . Alcohol use: No    Alcohol/week: 0.0 standard drinks  . Drug use: No     Medication list has been reviewed and updated.  Current Meds  Medication Sig  . acetaminophen (TYLENOL) 325  MG tablet Take 325 mg by mouth as needed.  Marland Kitchen amitriptyline (ELAVIL) 10 MG tablet Take 1 tablet (10 mg total) by mouth at bedtime.  Marland Kitchen atorvastatin (LIPITOR) 20 MG tablet Take 1 tablet (20 mg total) by mouth daily.  . Calcium Carb-Cholecalciferol (CALCIUM-VITAMIN D) 600-400 MG-UNIT TABS Take 2 tablets by mouth daily.  . carvedilol (COREG) 3.125 MG tablet Take 1 tablet (3.125 mg total) by mouth 2 (two) times daily. [place]  . cetirizine (ZYRTEC) 10 MG tablet Take 1 tablet (10 mg total) by mouth  daily.  . famotidine (PEPCID) 40 MG tablet Take 1 tablet (40 mg total) by mouth daily.  . ferrous sulfate 324 MG TBEC Take 324 mg by mouth daily.  . fluticasone (FLONASE) 50 MCG/ACT nasal spray SHAKE LIQUID AND USE 1 SPRAY IN EACH NOSTRIL TWICE DAILY  . Fluticasone-Salmeterol (WIXELA INHUB) 250-50 MCG/DOSE AEPB Inhale 1 puff into the lungs 2 (two) times daily.  . Glucos-Chond-Hyal Ac-Ca Fructo (MOVE FREE JOINT HEALTH ADVANCE PO) Take 1 tablet by mouth daily.  Marland Kitchen MEGARED OMEGA-3 KRILL OIL PO Take 1 capsule by mouth daily.  . meloxicam (MOBIC) 7.5 MG tablet   . omeprazole (PRILOSEC) 20 MG capsule Take 1 capsule (20 mg total) by mouth daily.  . ramipril (ALTACE) 2.5 MG capsule Take 1 capsule (2.5 mg total) by mouth daily. Dr Clayborn Bigness  . vitamin B-12 (CYANOCOBALAMIN) 1000 MCG tablet Take 1,000 mcg by mouth daily.    PHQ 2/9 Scores 08/07/2020 05/02/2020 04/16/2020 12/21/2019  PHQ - 2 Score 0 0 0 0  PHQ- 9 Score 0 0 0 0    GAD 7 : Generalized Anxiety Score 08/07/2020 05/02/2020 04/16/2020 12/21/2019  Nervous, Anxious, on Edge 0 0 0 0  Control/stop worrying 0 0 0 0  Worry too much - different things 0 0 0 0  Trouble relaxing 0 0 0 0  Restless 0 0 0 0  Easily annoyed or irritable 0 0 0 0  Afraid - awful might happen 0 0 0 0  Total GAD 7 Score 0 0 0 0    BP Readings from Last 3 Encounters:  08/07/20 102/64  05/02/20 120/64  04/16/20 110/76    Physical Exam Constitutional:      Appearance: She is well-developed.  HENT:     Right Ear: Ear canal and external ear normal. Tympanic membrane is not erythematous or retracted.     Left Ear: Ear canal and external ear normal. Tympanic membrane is not erythematous or retracted.     Nose:     Right Sinus: Maxillary sinus tenderness and frontal sinus tenderness present.     Left Sinus: Maxillary sinus tenderness and frontal sinus tenderness present.     Mouth/Throat:     Mouth: No oral lesions.     Pharynx: Uvula midline. Posterior oropharyngeal  erythema present. No oropharyngeal exudate.  Cardiovascular:     Rate and Rhythm: Normal rate and regular rhythm.     Heart sounds: Normal heart sounds.  Pulmonary:     Effort: Pulmonary effort is normal.     Breath sounds: Decreased breath sounds present. No wheezing, rhonchi or rales.  Lymphadenopathy:     Cervical: No cervical adenopathy.  Neurological:     Mental Status: She is alert and oriented to person, place, and time.     Wt Readings from Last 3 Encounters:  08/07/20 139 lb (63 kg)  05/02/20 137 lb (62.1 kg)  04/16/20 138 lb (62.6 kg)    BP 102/64  Pulse 84   Temp (!) 97.3 F (36.3 C) (Oral)   Ht 5\' 5"  (1.651 m)   Wt 139 lb (63 kg)   SpO2 98%   BMI 23.13 kg/m   Assessment and Plan: 1. Acute maxillary sinusitis, recurrence not specified Continue Flonase NS daily Continue adequate hydration - azithromycin (ZITHROMAX Z-PAK) 250 MG tablet; UAD  Dispense: 6 each; Refill: 0  2. Chronic bronchitis, unspecified chronic bronchitis type ([place]) Continue generic Advair Pt does not have a rescue inhaler - she says generally she does not need one - predniSONE (DELTASONE) 10 MG tablet; Take 1 tablet (10 mg total) by mouth as directed for 6 days. Take 6,5,4,3,2,1 then stop  Dispense: 21 tablet; Refill: 0 - guaiFENesin-codeine (ROBITUSSIN AC) 100-10 MG/5ML syrup; Take 5 mLs by mouth 3 (three) times daily as needed for cough.  Dispense: 118 mL; Refill: 0  3. Chronic kidney disease, stage 3b ([place]) Discussed need to avoid Nsaids and remain hydrated If worsening, would recommend Nephrology referral   Partially dictated using [place]. Any errors are unintentional.  Halina Maidens, MD [facility]  08/07/2020

## 2020-09-06 ENCOUNTER — Encounter: Payer: Self-pay | Admitting: Family Medicine

## 2020-09-06 ENCOUNTER — Other Ambulatory Visit: Payer: Self-pay

## 2020-09-06 ENCOUNTER — Ambulatory Visit (INDEPENDENT_AMBULATORY_CARE_PROVIDER_SITE_OTHER): Payer: Medicare HMO | Admitting: Family Medicine

## 2020-09-06 VITALS — BP 130/80 | HR 80 | Ht 65.0 in | Wt 141.0 lb

## 2020-09-06 DIAGNOSIS — R059 Cough, unspecified: Secondary | ICD-10-CM

## 2020-09-06 DIAGNOSIS — J0100 Acute maxillary sinusitis, unspecified: Secondary | ICD-10-CM

## 2020-09-06 DIAGNOSIS — J42 Unspecified chronic bronchitis: Secondary | ICD-10-CM | POA: Diagnosis not present

## 2020-09-06 MED ORDER — GUAIFENESIN-CODEINE 100-10 MG/5ML PO SYRP: 5.0000 mL | ORAL_SOLUTION | Freq: Four times a day (QID) | ORAL | 0 refills | Status: DC | PRN

## 2020-09-06 MED ORDER — AMOXICILLIN 500 MG PO CAPS: 500.0000 mg | ORAL_CAPSULE | Freq: Three times a day (TID) | ORAL | 0 refills | Status: DC

## 2020-09-06 MED ORDER — PREDNISONE 10 MG PO TABS: 10.0000 mg | ORAL_TABLET | Freq: Every day | ORAL | 0 refills | Status: DC

## 2020-09-06 NOTE — Progress Notes (Signed)
Date:  09/06/2020   Name:  Marie Villa   DOB:  [birth year]   MRN:  [id number]   Chief Complaint: Sinusitis (Coughing with yellow production, nasal congestion. )  Sinusitis This is a new problem. The current episode started in the past 7 days. The problem has been gradually improving since onset. There has been no fever. The pain is mild. Associated symptoms include congestion, coughing, sinus pressure and sneezing. Pertinent negatives include no chills, diaphoresis, ear pain, headaches, hoarse voice, neck pain, shortness of breath, sore throat or swollen glands. Past treatments include acetaminophen. The treatment provided mild relief.    Lab Results  Component Value Date   CREATININE 1.38 (H) 05/02/2020   BUN 24 (H) 05/02/2020   NA 139 05/02/2020   K 3.7 05/02/2020   CL 103 05/02/2020   CO2 25 05/02/2020   Lab Results  Component Value Date   CHOL 200 (H) 11/14/2019   HDL 64 11/14/2019   LDLCALC 107 (H) 11/14/2019   TRIG 168 (H) 11/14/2019   CHOLHDL 3.1 10/12/2017   No results found for: TSH No results found for: HGBA1C Lab Results  Component Value Date   WBC 6.5 05/02/2020   HGB 12.5 05/02/2020   HCT 38.9 05/02/2020   MCV 99.7 05/02/2020   PLT 226 05/02/2020   Lab Results  Component Value Date   ALT 24 05/02/2020   AST 23 05/02/2020   ALKPHOS 90 05/02/2020   BILITOT 0.7 05/02/2020     Review of Systems  Constitutional: Negative.  Negative for chills, diaphoresis, fatigue, fever and unexpected weight change.  HENT: Positive for congestion, sinus pressure and sneezing. Negative for ear discharge, ear pain, hoarse voice, rhinorrhea and sore throat.   Eyes: Negative for double vision, photophobia, pain, discharge, redness and itching.  Respiratory: Positive for cough. Negative for shortness of breath, wheezing and stridor.   Gastrointestinal: Negative for abdominal pain, blood in stool, constipation, diarrhea, nausea and vomiting.  Endocrine: Negative for cold  intolerance, heat intolerance, polydipsia, polyphagia and polyuria.  Genitourinary: Negative for dysuria, flank pain, frequency, hematuria, menstrual problem, pelvic pain, urgency, vaginal bleeding and vaginal discharge.  Musculoskeletal: Negative for arthralgias, back pain, myalgias and neck pain.  Skin: Negative for rash.  Allergic/Immunologic: Negative for environmental allergies and food allergies.  Neurological: Negative for dizziness, weakness, light-headedness, numbness and headaches.  Hematological: Negative for adenopathy. Does not bruise/bleed easily.  Psychiatric/Behavioral: Negative for dysphoric mood. The patient is not nervous/anxious.     Patient Active Problem List   Diagnosis Date Noted   Left knee pain 11/25/2018   Primary osteoarthritis of left knee 11/25/2018   Essential hypertension 10/12/2017   COPD (chronic obstructive pulmonary disease) ([place]) 04/10/2017   Simple chronic bronchitis ([place]) 06/09/2016   Other seasonal allergic rhinitis 06/09/2016   Cardiomyopathy ([place]) 06/06/2015   Chronic kidney disease, stage 3b ([place]) 06/06/2015   CCF (congestive cardiac failure) ([place]) 06/06/2015   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[place]) 06/06/2015   Acid reflux 06/06/2015   Hypercholesteremia 06/06/2015   Cardiac murmur 12/16/2013   SOB (shortness of breath) 12/16/2013    Allergies  Allergen Reactions   Sulfa Antibiotics     Past Surgical History:  Procedure Laterality Date   CATARACT EXTRACTION, BILATERAL     COLONOSCOPY  2012   normal- cleared for 10- Iftikhar   VAGINAL HYSTERECTOMY      Social History   Tobacco Use   Smoking status: Former Smoker    Types: Cigarettes   Smokeless  tobacco: Never Used   Tobacco comment: quit over 50 years  Vaping Use   Vaping Use: Never used  Substance Use Topics   Alcohol use: No    Alcohol/week: 0.0 standard drinks   Drug use: No     Medication list has been reviewed and updated.  Current Meds   Medication Sig   acetaminophen (TYLENOL) 325 MG tablet Take 325 mg by mouth as needed.   amitriptyline (ELAVIL) 10 MG tablet Take 1 tablet (10 mg total) by mouth at bedtime.   atorvastatin (LIPITOR) 20 MG tablet Take 1 tablet (20 mg total) by mouth daily.   Calcium Carb-Cholecalciferol (CALCIUM-VITAMIN D) 600-400 MG-UNIT TABS Take 2 tablets by mouth daily.   carvedilol (COREG) 3.125 MG tablet Take 1 tablet (3.125 mg total) by mouth 2 (two) times daily. [place]   cetirizine (ZYRTEC) 10 MG tablet Take 1 tablet (10 mg total) by mouth daily.   famotidine (PEPCID) 40 MG tablet Take 1 tablet (40 mg total) by mouth daily.   ferrous sulfate 324 MG TBEC Take 324 mg by mouth daily.   fluticasone (FLONASE) 50 MCG/ACT nasal spray SHAKE LIQUID AND USE 1 SPRAY IN EACH NOSTRIL TWICE DAILY   Fluticasone-Salmeterol (WIXELA INHUB) 250-50 MCG/DOSE AEPB Inhale 1 puff into the lungs 2 (two) times daily.   Glucos-Chond-Hyal Ac-Ca Fructo (MOVE FREE JOINT HEALTH ADVANCE PO) Take 1 tablet by mouth daily.   MEGARED OMEGA-3 KRILL OIL PO Take 1 capsule by mouth daily.   meloxicam (MOBIC) 7.5 MG tablet    omeprazole (PRILOSEC) 20 MG capsule Take 1 capsule (20 mg total) by mouth daily.   ramipril (ALTACE) 2.5 MG capsule Take 1 capsule (2.5 mg total) by mouth daily. Dr Clayborn Bigness   vitamin B-12 (CYANOCOBALAMIN) 1000 MCG tablet Take 1,000 mcg by mouth daily.    PHQ 2/9 Scores 08/07/2020 05/02/2020 04/16/2020 12/21/2019  PHQ - 2 Score 0 0 0 0  PHQ- 9 Score 0 0 0 0    GAD 7 : Generalized Anxiety Score 08/07/2020 05/02/2020 04/16/2020 12/21/2019  Nervous, Anxious, on Edge 0 0 0 0  Control/stop worrying 0 0 0 0  Worry too much - different things 0 0 0 0  Trouble relaxing 0 0 0 0  Restless 0 0 0 0  Easily annoyed or irritable 0 0 0 0  Afraid - awful might happen 0 0 0 0  Total GAD 7 Score 0 0 0 0    BP Readings from Last 3 Encounters:  09/06/20 130/80  08/07/20 102/64  05/02/20 120/64    Physical  Exam Vitals reviewed.  Constitutional:      Appearance: She is well-developed and well-nourished.  HENT:     Head: Normocephalic.     Right Ear: Tympanic membrane, ear canal and external ear normal.     Left Ear: Tympanic membrane, ear canal and external ear normal.     Nose: Nose normal.     Mouth/Throat:     Mouth: Oropharynx is clear and moist. Mucous membranes are moist.  Eyes:     General: Lids are everted, no foreign bodies appreciated. No scleral icterus.       Left eye: No foreign body or hordeolum.     Extraocular Movements: EOM normal.     Conjunctiva/sclera: Conjunctivae normal.     Right eye: Right conjunctiva is not injected.     Left eye: Left conjunctiva is not injected.     Pupils: Pupils are equal, round, and reactive to light.  Neck:  Thyroid: No thyromegaly.     Vascular: No JVD.     Trachea: No tracheal deviation.  Cardiovascular:     Rate and Rhythm: Normal rate and regular rhythm.     Pulses: Intact distal pulses.     Heart sounds: Normal heart sounds. No murmur heard. No friction rub. No gallop.   Pulmonary:     Effort: Pulmonary effort is normal. No respiratory distress.     Breath sounds: Normal breath sounds. No wheezing, rhonchi or rales.  Abdominal:     General: Bowel sounds are normal.     Palpations: Abdomen is soft. There is no hepatosplenomegaly or mass.     Tenderness: There is no abdominal tenderness. There is no guarding or rebound.  Musculoskeletal:        General: No tenderness or edema. Normal range of motion.     Cervical back: Normal range of motion and neck supple.  Lymphadenopathy:     Cervical: No cervical adenopathy.  Skin:    General: Skin is warm.     Findings: No rash.  Neurological:     Mental Status: She is alert and oriented to person, place, and time.     Cranial Nerves: No cranial nerve deficit.     Deep Tendon Reflexes: Strength normal. Reflexes normal.  Psychiatric:        Mood and Affect: Mood and affect normal.  Mood is not anxious or depressed.     Wt Readings from Last 3 Encounters:  09/06/20 141 lb (64 kg)  08/07/20 139 lb (63 kg)  05/02/20 137 lb (62.1 kg)    BP 130/80    Pulse 80    Ht 5\' 5"  (1.651 m)    Wt 141 lb (64 kg)    BMI 23.46 kg/m   Assessment and Plan: 1. Acute maxillary sinusitis, recurrence not specified New onset.  Persistent.  Stable.  As previous amoxicillin has done the best 500 mg 3 times a day for 10 days. - amoxicillin (AMOXIL) 500 MG capsule; Take 1 capsule (500 mg total) by mouth 3 (three) times daily.  Dispense: 30 capsule; Refill: 0  2. Cough New onset.  Persistent.  Episodic.  Patient has an ongoing cough either from the postnasal drainage or pulmonary irritation.  Will initiate Robitussin-AC 1 teaspoon every 6 hours as needed cough. - guaiFENesin-codeine (ROBITUSSIN AC) 100-10 MG/5ML syrup; Take 5 mLs by mouth 4 (four) times daily as needed for cough.  Dispense: 118 mL; Refill: 0  3. Chronic bronchitis, unspecified chronic bronchitis type ([facility]) Chronic.  Uncontrolled.  Stable.  In the past patient has had flareups of her bronchitis when upper respiratory infections have ensued.  We will treat with prednisone 10 mg once a day. - predniSONE (DELTASONE) 10 MG tablet; Take 1 tablet (10 mg total) by mouth daily with breakfast.  Dispense: 14 tablet; Refill: 0

## 2020-09-18 DIAGNOSIS — I6523 Occlusion and stenosis of bilateral carotid arteries: Secondary | ICD-10-CM | POA: Diagnosis not present

## 2020-09-18 DIAGNOSIS — R0609 Other forms of dyspnea: Secondary | ICD-10-CM | POA: Diagnosis not present

## 2020-10-10 DIAGNOSIS — I6523 Occlusion and stenosis of bilateral carotid arteries: Secondary | ICD-10-CM | POA: Diagnosis not present

## 2020-10-10 DIAGNOSIS — I429 Cardiomyopathy, unspecified: Secondary | ICD-10-CM | POA: Diagnosis not present

## 2020-10-10 DIAGNOSIS — E7800 Pure hypercholesterolemia, unspecified: Secondary | ICD-10-CM | POA: Diagnosis not present

## 2020-10-10 DIAGNOSIS — I509 Heart failure, unspecified: Secondary | ICD-10-CM | POA: Diagnosis not present

## 2020-10-10 DIAGNOSIS — I10 Essential (primary) hypertension: Secondary | ICD-10-CM | POA: Diagnosis not present

## 2020-10-10 DIAGNOSIS — K219 Gastro-esophageal reflux disease without esophagitis: Secondary | ICD-10-CM | POA: Diagnosis not present

## 2020-10-10 DIAGNOSIS — N1832 Chronic kidney disease, stage 3b: Secondary | ICD-10-CM | POA: Diagnosis not present

## 2020-10-10 DIAGNOSIS — R0609 Other forms of dyspnea: Secondary | ICD-10-CM | POA: Diagnosis not present

## 2020-10-10 DIAGNOSIS — J42 Unspecified chronic bronchitis: Secondary | ICD-10-CM | POA: Diagnosis not present

## 2020-10-19 ENCOUNTER — Other Ambulatory Visit: Payer: Medicare HMO

## 2020-10-19 ENCOUNTER — Other Ambulatory Visit: Payer: Self-pay

## 2020-10-19 DIAGNOSIS — E7800 Pure hypercholesterolemia, unspecified: Secondary | ICD-10-CM | POA: Diagnosis not present

## 2020-10-19 DIAGNOSIS — I10 Essential (primary) hypertension: Secondary | ICD-10-CM | POA: Diagnosis not present

## 2020-10-19 DIAGNOSIS — D508 Other iron deficiency anemias: Secondary | ICD-10-CM | POA: Diagnosis not present

## 2020-10-20 LAB — LIPID PANEL WITH LDL/HDL RATIO
Cholesterol, Total: 184 mg/dL (ref 100–199)
HDL: 67 mg/dL (ref >39)
LDL Chol Calc (NIH): 93 mg/dL (ref 0–99)
LDL/HDL Ratio: 1.4 ratio (ref 0.0–3.2)
Triglycerides: 137 mg/dL (ref 0–149)
VLDL Cholesterol Cal: 24 mg/dL (ref 5–40)

## 2020-10-20 LAB — CBC WITH DIFFERENTIAL/PLATELET
Basophils Absolute: 0.1 10*3/uL (ref 0.0–0.2)
Basos: 1 %
EOS (ABSOLUTE): 0.8 10*3/uL — ABNORMAL HIGH (ref 0.0–0.4)
Eos: 9 %
Hematocrit: 35.9 % (ref 34.0–46.6)
Hemoglobin: 12.4 g/dL (ref 11.1–15.9)
Immature Grans (Abs): 0 10*3/uL (ref 0.0–0.1)
Immature Granulocytes: 1 %
Lymphocytes Absolute: 1.9 10*3/uL (ref 0.7–3.1)
Lymphs: 22 %
MCH: 33 pg (ref 26.6–33.0)
MCHC: 34.5 g/dL (ref 31.5–35.7)
MCV: 96 fL (ref 79–97)
Monocytes Absolute: 1.2 10*3/uL — ABNORMAL HIGH (ref 0.1–0.9)
Monocytes: 13 %
Neutrophils Absolute: 4.8 10*3/uL (ref 1.4–7.0)
Neutrophils: 54 %
Platelets: 265 10*3/uL (ref 150–450)
RBC: 3.76 x10E6/uL — ABNORMAL LOW (ref 3.77–5.28)
RDW: 12.4 % (ref 11.7–15.4)
WBC: 8.8 10*3/uL (ref 3.4–10.8)

## 2020-10-20 LAB — COMPREHENSIVE METABOLIC PANEL
ALT: 20 IU/L (ref 0–32)
AST: 22 IU/L (ref 0–40)
Albumin/Globulin Ratio: 1.5 (ref 1.2–2.2)
Albumin: 4.2 g/dL (ref 3.6–4.6)
Alkaline Phosphatase: 152 IU/L — ABNORMAL HIGH (ref 44–121)
BUN/Creatinine Ratio: 11 — ABNORMAL LOW (ref 12–28)
BUN: 17 mg/dL (ref 8–27)
Bilirubin Total: 0.5 mg/dL (ref 0.0–1.2)
CO2: 19 mmol/L — ABNORMAL LOW (ref 20–29)
Calcium: 9.8 mg/dL (ref 8.7–10.3)
Chloride: 101 mmol/L (ref 96–106)
Creatinine, Ser: 1.51 mg/dL — ABNORMAL HIGH (ref 0.57–1.00)
GFR calc Af Amer: 37 mL/min/{1.73_m2} — ABNORMAL LOW (ref >59)
GFR calc non Af Amer: 32 mL/min/{1.73_m2} — ABNORMAL LOW (ref >59)
Globulin, Total: 2.8 g/dL (ref 1.5–4.5)
Glucose: 107 mg/dL — ABNORMAL HIGH (ref 65–99)
Potassium: 4.5 mmol/L (ref 3.5–5.2)
Sodium: 140 mmol/L (ref 134–144)
Total Protein: 7 g/dL (ref 6.0–8.5)

## 2020-10-22 ENCOUNTER — Ambulatory Visit (INDEPENDENT_AMBULATORY_CARE_PROVIDER_SITE_OTHER): Payer: Medicare HMO | Admitting: Family Medicine

## 2020-10-22 ENCOUNTER — Other Ambulatory Visit: Payer: Self-pay

## 2020-10-22 ENCOUNTER — Encounter: Payer: Self-pay | Admitting: Family Medicine

## 2020-10-22 DIAGNOSIS — I10 Essential (primary) hypertension: Secondary | ICD-10-CM

## 2020-10-22 DIAGNOSIS — J441 Chronic obstructive pulmonary disease with (acute) exacerbation: Secondary | ICD-10-CM | POA: Diagnosis not present

## 2020-10-22 DIAGNOSIS — E7800 Pure hypercholesterolemia, unspecified: Secondary | ICD-10-CM | POA: Diagnosis not present

## 2020-10-22 DIAGNOSIS — J302 Other seasonal allergic rhinitis: Secondary | ICD-10-CM

## 2020-10-22 DIAGNOSIS — F5101 Primary insomnia: Secondary | ICD-10-CM | POA: Diagnosis not present

## 2020-10-22 DIAGNOSIS — K219 Gastro-esophageal reflux disease without esophagitis: Secondary | ICD-10-CM | POA: Diagnosis not present

## 2020-10-22 MED ORDER — FAMOTIDINE 40 MG PO TABS: 40.0000 mg | ORAL_TABLET | Freq: Every day | ORAL | 1 refills | Status: DC

## 2020-10-22 MED ORDER — OMEPRAZOLE 20 MG PO CPDR: 20.0000 mg | DELAYED_RELEASE_CAPSULE | Freq: Every day | ORAL | 1 refills | Status: DC

## 2020-10-22 MED ORDER — ATORVASTATIN CALCIUM 20 MG PO TABS: 20.0000 mg | ORAL_TABLET | Freq: Every day | ORAL | 1 refills | Status: DC

## 2020-10-22 MED ORDER — CETIRIZINE HCL 10 MG PO TABS: 10.0000 mg | ORAL_TABLET | Freq: Every day | ORAL | 1 refills | Status: DC

## 2020-10-22 MED ORDER — CARVEDILOL 3.125 MG PO TABS: 3.1250 mg | ORAL_TABLET | Freq: Two times a day (BID) | ORAL | 1 refills | Status: DC

## 2020-10-22 MED ORDER — AMITRIPTYLINE HCL 10 MG PO TABS: 10.0000 mg | ORAL_TABLET | Freq: Every day | ORAL | 1 refills | Status: DC

## 2020-10-22 MED ORDER — FLUTICASONE PROPIONATE 50 MCG/ACT NA SUSP: NASAL | 1 refills | Status: DC

## 2020-10-22 MED ORDER — FLUTICASONE-SALMETEROL 250-50 MCG/DOSE IN AEPB: 1.0000 | INHALATION_SPRAY | Freq: Two times a day (BID) | RESPIRATORY_TRACT | 5 refills | Status: DC

## 2020-10-22 NOTE — Progress Notes (Signed)
Date:  10/22/2020   Name:  Marie Villa   DOB:  27-Apr-1941   MRN:  [id number]   Chief Complaint: Hyperlipidemia, Hypertension, Allergic Rhinitis , COPD, Insomnia, and Gastroesophageal Reflux  Hyperlipidemia This is a chronic problem. The current episode started more than 1 year ago. The problem is controlled. Recent lipid tests were reviewed and are normal. She has no history of chronic renal disease, diabetes, hypothyroidism, liver disease, obesity or nephrotic syndrome. Pertinent negatives include no chest pain, focal sensory loss, focal weakness, leg pain, myalgias or shortness of breath. Current antihyperlipidemic treatment includes statins. The current treatment provides moderate improvement of lipids.  Hypertension This is a chronic problem. The current episode started more than 1 year ago. The problem has been waxing and waning since onset. The problem is controlled. Pertinent negatives include no anxiety, blurred vision, chest pain, headaches, malaise/fatigue, neck pain, orthopnea, palpitations, peripheral edema, PND, shortness of breath or sweats. Past treatments include beta blockers and ACE inhibitors. The current treatment provides moderate improvement. There is no history of angina, kidney disease, CAD/MI, CVA, heart failure, left ventricular hypertrophy, PVD or retinopathy. There is no history of chronic renal disease, a hypertension causing med or renovascular disease.  COPD There is no chest tightness, cough, difficulty breathing, frequent throat clearing, hemoptysis, hoarse voice, shortness of breath, sputum production or wheezing. This is a chronic problem. The problem has been gradually improving. Pertinent negatives include no chest pain, ear pain, fever, headaches, malaise/fatigue, myalgias, PND, rhinorrhea, sneezing, sore throat or sweats. She reports moderate improvement on treatment. Her past medical history is significant for COPD.  Insomnia Primary symptoms: no fragmented  sleep, no malaise/fatigue.  The problem has been gradually improving since onset. The treatment provided moderate relief.  Gastroesophageal Reflux She reports no abdominal pain, no chest pain, no coughing, no hoarse voice, no nausea, no sore throat or no wheezing. Pertinent negatives include no fatigue.    Lab Results  Component Value Date   CREATININE 1.51 (H) 10/19/2020   BUN 17 10/19/2020   NA 140 10/19/2020   K 4.5 10/19/2020   CL 101 10/19/2020   CO2 19 (L) 10/19/2020   Lab Results  Component Value Date   CHOL 184 10/19/2020   HDL 67 10/19/2020   LDLCALC 93 10/19/2020   TRIG 137 10/19/2020   CHOLHDL 3.1 10/12/2017   No results found for: TSH No results found for: HGBA1C Lab Results  Component Value Date   WBC 8.8 10/19/2020   HGB 12.4 10/19/2020   HCT 35.9 10/19/2020   MCV 96 10/19/2020   PLT 265 10/19/2020   Lab Results  Component Value Date   ALT 20 10/19/2020   AST 22 10/19/2020   ALKPHOS 152 (H) 10/19/2020   BILITOT 0.5 10/19/2020     Review of Systems  Constitutional: Negative.  Negative for chills, fatigue, fever, malaise/fatigue and unexpected weight change.  HENT: Negative for congestion, ear discharge, ear pain, hoarse voice, rhinorrhea, sinus pressure, sneezing and sore throat.   Eyes: Negative for blurred vision, double vision, photophobia, pain, discharge, redness and itching.  Respiratory: Negative for cough, hemoptysis, sputum production, shortness of breath, wheezing and stridor.   Cardiovascular: Negative for chest pain, palpitations, orthopnea and PND.  Gastrointestinal: Negative for abdominal pain, blood in stool, constipation, diarrhea, nausea and vomiting.  Endocrine: Negative for cold intolerance, heat intolerance, polydipsia, polyphagia and polyuria.  Genitourinary: Negative for dysuria, flank pain, frequency, hematuria, menstrual problem, pelvic pain, urgency, vaginal bleeding and vaginal discharge.  Musculoskeletal: Negative for  arthralgias, back pain, myalgias and neck pain.  Skin: Negative for rash.  Allergic/Immunologic: Negative for environmental allergies and food allergies.  Neurological: Negative for dizziness, focal weakness, weakness, light-headedness, numbness and headaches.  Hematological: Negative for adenopathy. Does not bruise/bleed easily.  Psychiatric/Behavioral: Negative for dysphoric mood. The patient has insomnia. The patient is not nervous/anxious.     Patient Active Problem List   Diagnosis Date Noted  . Primary insomnia 10/22/2020  . Left knee pain 11/25/2018  . Primary osteoarthritis of left knee 11/25/2018  . Essential hypertension 10/12/2017  . COPD (chronic obstructive pulmonary disease) ([place]) 04/10/2017  . Simple chronic bronchitis ([place]) 06/09/2016  . Other seasonal allergic rhinitis 06/09/2016  . Cardiomyopathy ([place]) 06/06/2015  . 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 CCF (congestive cardiac failure) ([place]) 06/06/2015  . 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[place]) 06/06/2015  . Gastroesophageal reflux disease 06/06/2015  . Hypercholesteremia 06/06/2015  . Cardiac murmur 12/16/2013  . SOB (shortness of breath) 12/16/2013    Allergies  Allergen Reactions  . Sulfa Antibiotics     Past Surgical History:  Procedure Laterality Date  . CATARACT EXTRACTION, BILATERAL    . COLONOSCOPY  2012   normal- cleared for 10- Iftikhar  . VAGINAL HYSTERECTOMY      Social History   Tobacco Use  . Smoking status: Former Smoker    Types: Cigarettes  . Smokeless tobacco: Never Used  . Tobacco comment: quit over 50 years  Vaping Use  . Vaping Use: Never used  Substance Use Topics  . Alcohol use: No    Alcohol/week: 0.0 standard drinks  . Drug use: No     Medication list has been reviewed and updated.  Current Meds  Medication Sig  . acetaminophen (TYLENOL) 325 MG tablet Take 325 mg by mouth as needed.  Marland Kitchen amitriptyline (ELAVIL) 10 MG tablet Take 1 tablet (10 mg total)  by mouth at bedtime.  Marland Kitchen atorvastatin (LIPITOR) 20 MG tablet Take 1 tablet (20 mg total) by mouth daily.  . Calcium Carb-Cholecalciferol (CALCIUM-VITAMIN D) 600-400 MG-UNIT TABS Take 2 tablets by mouth daily.  . carvedilol (COREG) 3.125 MG tablet Take 1 tablet (3.125 mg total) by mouth 2 (two) times daily. [place]  . cetirizine (ZYRTEC) 10 MG tablet Take 1 tablet (10 mg total) by mouth daily.  . famotidine (PEPCID) 40 MG tablet Take 1 tablet (40 mg total) by mouth daily.  . ferrous sulfate 324 MG TBEC Take 324 mg by mouth daily.  . fluticasone (FLONASE) 50 MCG/ACT nasal spray SHAKE LIQUID AND USE 1 SPRAY IN EACH NOSTRIL TWICE DAILY  . Fluticasone-Salmeterol (WIXELA INHUB) 250-50 MCG/DOSE AEPB Inhale 1 puff into the lungs 2 (two) times daily.  . Glucos-Chond-Hyal Ac-Ca Fructo (MOVE FREE JOINT HEALTH ADVANCE PO) Take 1 tablet by mouth daily.  Marland Kitchen MEGARED OMEGA-3 KRILL OIL PO Take 1 capsule by mouth daily.  . meloxicam (MOBIC) 7.5 MG tablet   . omeprazole (PRILOSEC) 20 MG capsule Take 1 capsule (20 mg total) by mouth daily.  . ramipril (ALTACE) 2.5 MG capsule Take 1 capsule (2.5 mg total) by mouth daily. Dr Clayborn Bigness  . vitamin B-12 (CYANOCOBALAMIN) 1000 MCG tablet Take 1,000 mcg by mouth daily.    PHQ 2/9 Scores 08/07/2020 05/02/2020 04/16/2020 12/21/2019  PHQ - 2 Score 0 0 0 0  PHQ- 9 Score 0 0 0 0    GAD 7 : Generalized Anxiety Score 08/07/2020 05/02/2020 04/16/2020 12/21/2019  Nervous, Anxious, on Edge 0 0 0  0  Control/stop worrying 0 0 0 0  Worry too much - different things 0 0 0 0  Trouble relaxing 0 0 0 0  Restless 0 0 0 0  Easily annoyed or irritable 0 0 0 0  Afraid - awful might happen 0 0 0 0  Total GAD 7 Score 0 0 0 0    BP Readings from Last 3 Encounters:  10/22/20 100/68  09/06/20 130/80  08/07/20 102/64    Physical Exam Vitals and nursing note reviewed.  Constitutional:      General: She is not in acute distress.    Appearance: She is not diaphoretic.  HENT:     Head:  Normocephalic and atraumatic.     Right Ear: Tympanic membrane, ear canal and external ear normal.     Left Ear: Tympanic membrane, ear canal and external ear normal.     Nose: No congestion or rhinorrhea.     Mouth/Throat:     Mouth: Oropharynx is clear and moist.  Eyes:     General:        Right eye: No discharge.        Left eye: No discharge.     Extraocular Movements: EOM normal.     Conjunctiva/sclera: Conjunctivae normal.     Pupils: Pupils are equal, round, and reactive to light.  Neck:     Thyroid: No thyromegaly.     Vascular: No JVD.  Cardiovascular:     Rate and Rhythm: Normal rate and regular rhythm.     Pulses: Intact distal pulses.     Heart sounds: Normal heart sounds. No murmur heard. No friction rub. No gallop.   Pulmonary:     Effort: Pulmonary effort is normal.     Breath sounds: Normal breath sounds. No wheezing or rhonchi.  Abdominal:     General: Bowel sounds are normal.     Palpations: Abdomen is soft. There is no mass.     Tenderness: There is no abdominal tenderness. There is no guarding.  Musculoskeletal:        General: No edema. Normal range of motion.     Cervical back: Normal range of motion and neck supple.  Lymphadenopathy:     Cervical: No cervical adenopathy.  Skin:    General: Skin is warm and dry.  Neurological:     Mental Status: She is alert.     Deep Tendon Reflexes: Reflexes are normal and symmetric.     Wt Readings from Last 3 Encounters:  10/22/20 140 lb (63.5 kg)  09/06/20 141 lb (64 kg)  08/07/20 139 lb (63 kg)    BP 100/68   Pulse 68   Ht '5\' 5"'$  (1.651 m)   Wt 140 lb (63.5 kg)   BMI 23.30 kg/m   Assessment and Plan: 1. Essential hypertension Chronic.  Controlled.  Stable.  Blood pressure today is 100/68.  Patient will continue Lamictal as prescribed by cardiology and carvedilol 3.1251 twice a day. - carvedilol (COREG) 3.125 MG tablet; Take 1 tablet (3.125 mg total) by mouth 2 (two) times daily. [place]  Dispense:  180 tablet; Refill: 1  2. Hypercholesteremia Chronic.  Controlled.  Stable.  Continue atorvastatin 20 mg once a day. - atorvastatin (LIPITOR) 20 MG tablet; Take 1 tablet (20 mg total) by mouth daily.  Dispense: 90 tablet; Refill: 1  3. Primary insomnia .  Controlled.  Stable.  Continue amitriptyline 10 mg once at night. - amitriptyline (ELAVIL) 10 MG tablet; Take 1 tablet (10  mg total) by mouth at bedtime.  Dispense: 90 tablet; Refill: 1  4. Gastroesophageal reflux disease Chronic.  Controlled.  Stable.  Continue famotidine 40 mg once a day. - famotidine (PEPCID) 40 MG tablet; Take 1 tablet (40 mg total) by mouth daily.  Dispense: 90 tablet; Refill: 1 - omeprazole (PRILOSEC) 20 MG capsule; Take 1 capsule (20 mg total) by mouth daily.  Dispense: 90 capsule; Refill: 1  5. Other seasonal allergic rhinitis Chronic.  Controlled.  Stable.  This is episodic and seasonal and without suffering pollen season we will emphasized to take her Zyrtec 10 mg once a day and fluticasone nasal spray 1 spray in each nostril twice a day. - cetirizine (ZYRTEC) 10 MG tablet; Take 1 tablet (10 mg total) by mouth daily.  Dispense: 90 tablet; Refill: 1 - fluticasone (FLONASE) 50 MCG/ACT nasal spray; SHAKE LIQUID AND USE 1 SPRAY IN EACH NOSTRIL TWICE DAILY  Dispense: 16 g; Refill: 1  6. Chronic obstructive pulmonary disease with acute exacerbation (HCC) Chronic.  Controlled.  Stable.  Continue with fluticasone salmeterol 2 50-50 1 puff in lungs twice a day.250-50 mcg - Fluticasone-Salmeterol (WIXELA INHUB) 250-50 MCG/DOSE AEPB; Inhale 1 puff into the lungs 2 (two) times daily.  Dispense: 60 each; Refill: 5

## 2020-11-01 ENCOUNTER — Other Ambulatory Visit: Payer: Self-pay

## 2020-11-01 DIAGNOSIS — J0100 Acute maxillary sinusitis, unspecified: Secondary | ICD-10-CM

## 2020-11-01 MED ORDER — AZITHROMYCIN 250 MG PO TABS: ORAL_TABLET | ORAL | 0 refills | Status: DC

## 2020-11-01 NOTE — Progress Notes (Signed)
Sent in zpack 

## 2020-11-05 ENCOUNTER — Other Ambulatory Visit: Payer: Self-pay | Admitting: Family Medicine

## 2020-11-05 DIAGNOSIS — J302 Other seasonal allergic rhinitis: Secondary | ICD-10-CM

## 2020-11-11 ENCOUNTER — Other Ambulatory Visit: Payer: Self-pay | Admitting: Family Medicine

## 2020-11-11 DIAGNOSIS — J302 Other seasonal allergic rhinitis: Secondary | ICD-10-CM

## 2020-11-13 ENCOUNTER — Other Ambulatory Visit: Payer: Self-pay | Admitting: Family Medicine

## 2020-11-13 DIAGNOSIS — J302 Other seasonal allergic rhinitis: Secondary | ICD-10-CM

## 2020-11-13 NOTE — Telephone Encounter (Signed)
Requested Prescriptions  Pending Prescriptions Disp Refills  . cetirizine (ZYRTEC) 10 MG tablet [Pharmacy Med Name: CETIRIZINE '10MG'$  TABLETS] 90 tablet 1    Sig: TAKE 1 TABLET(10 MG) BY MOUTH DAILY     Ear, Nose, and Throat:  Antihistamines Passed - 11/13/2020 10:37 AM      Passed - Valid encounter within last 12 months    Recent Outpatient Visits          3 weeks ago Essential hypertension   [facility] Juline Patch, MD   2 months ago Acute maxillary sinusitis, recurrence not specified   [facility] Juline Patch, MD   3 months ago Acute maxillary sinusitis, recurrence not specified   [facility] Glean Hess, MD   6 months ago History of anemia   [facility] Juline Patch, MD   7 months ago Acute maxillary sinusitis, recurrence not specified   [facility] Juline Patch, MD      Future Appointments            In 4 months Juline Patch, MD [facility], [facility]

## 2020-11-19 ENCOUNTER — Ambulatory Visit (INDEPENDENT_AMBULATORY_CARE_PROVIDER_SITE_OTHER): Payer: Medicare HMO

## 2020-11-19 ENCOUNTER — Other Ambulatory Visit: Payer: Self-pay

## 2020-11-19 VITALS — BP 108/62 | HR 103 | Temp 97.8°F | Resp 17 | Ht 65.0 in | Wt 140.6 lb

## 2020-11-19 DIAGNOSIS — Z0000 Encounter for general adult medical examination without abnormal findings: Secondary | ICD-10-CM | POA: Diagnosis not present

## 2020-11-19 NOTE — Progress Notes (Signed)
Subjective:   Marie Villa is a 80 y.o. female who presents for Medicare Annual (Subsequent) preventive examination.  Review of Systems     Cardiac Risk Factors include: advanced age (>5mn, >>11women);dyslipidemia;hypertension     Objective:    Today's Vitals   11/19/20 0927  BP: 108/62  Pulse: (!) 103  Resp: 17  Temp: 97.8 F (36.6 C)  TempSrc: Oral  SpO2: 96%  Weight: 140 lb 9.6 oz (63.8 kg)  Height: '5\' 5"'$  (1.651 m)   Body mass index is 23.4 kg/m.  Advanced Directives 11/19/2020 11/16/2019 06/06/2015  Does Patient Have a Medical Advance Directive? Yes Yes Yes  Type of AParamedicof APunta SantiagoLiving will [place]ert [place] will Living will;Healthcare Power of [place]viewin Chart? No - copy requested No - copy requested No - copy requested    Current Medications (verified) Outpatient Encounter Medications as of 11/19/2020  Medication Sig   acetaminophen (TYLENOL) 325 MG tablet Take 325 mg by mouth as needed.   amitriptyline (ELAVIL) 10 MG tablet Take 1 tablet (10 mg total) by mouth at bedtime.   atorvastatin (LIPITOR) 20 MG tablet Take 1 tablet (20 mg total) by mouth daily.   Calcium Carb-Cholecalciferol (CALCIUM-VITAMIN D) 600-400 MG-UNIT TABS Take 2 tablets by mouth daily.   carvedilol (COREG) 3.125 MG tablet Take 1 tablet (3.125 mg total) by mouth 2 (two) times daily. [place]   cetirizine (ZYRTEC) 10 MG tablet TAKE 1 TABLET(10 MG) BY MOUTH DAILY   famotidine (PEPCID) 40 MG tablet Take 1 tablet (40 mg total) by mouth daily.   ferrous sulfate 324 MG TBEC Take 324 mg by mouth daily.   fluticasone (FLONASE) 50 MCG/ACT nasal spray SHAKE LIQUID AND USE 1 SPRAY IN EACH NOSTRIL TWICE DAILY   Fluticasone-Salmeterol (WIXELA INHUB) 250-50 MCG/DOSE AEPB Inhale 1 puff into the lungs 2 (two) times daily.   Glucos-Chond-Hyal Ac-Ca Fructo (MOVE FREE JOINT HEALTH ADVANCE PO) Take 1 tablet by mouth  daily.   MEGARED OMEGA-3 KRILL OIL PO Take 1 capsule by mouth daily.   meloxicam (MOBIC) 7.5 MG tablet    omeprazole (PRILOSEC) 20 MG capsule Take 1 capsule (20 mg total) by mouth daily.   ramipril (ALTACE) 2.5 MG capsule Take 1 capsule (2.5 mg total) by mouth daily. Dr CClayborn Bigness  vitamin B-12 (CYANOCOBALAMIN) 1000 MCG tablet Take 1,000 mcg by mouth daily.   [DISCONTINUED] azithromycin (ZITHROMAX) 250 MG tablet Take 2 tabs today and 1 each day there after   No facility-administered encounter medications on file as of 11/19/2020.    Allergies (verified) Sulfa antibiotics   History: Past Medical History:  Diagnosis Date   Congestive heart disease (HCC)    COPD (chronic obstructive pulmonary disease) (HCC)    GERD (gastroesophageal reflux disease)    Hyperlipidemia    Hypertension    Past Surgical History:  Procedure Laterality Date   CATARACT EXTRACTION, BILATERAL     COLONOSCOPY  2012   normal- cleared for 10- Iftikhar   VAGINAL HYSTERECTOMY     Family History  Problem Relation Age of Onset   Breast cancer Neg Hx    Social History   Socioeconomic History   Marital status: Married    Spouse name: Not on file   Number of children: 2   Years of education: Not on file   Highest education level: Not on file  Occupational History    Comment: retired  Tobacco Use  Smoking status: Former Smoker    Types: Cigarettes   Smokeless tobacco: Never Used   Tobacco comment: quit over 50 years  Vaping Use   Vaping Use: Never used  Substance and Sexual Activity   Alcohol use: Yes    Alcohol/week: 1.0 standard drink    Types: 1 Cans of beer per week    Comment: occasionally   Drug use: No   Sexual activity: Yes  Other Topics Concern   Not on file  Social History Narrative   Not on file   Social Determinants of Health   Financial Resource Strain: Low Risk    Difficulty of Paying Living Expenses: Not hard at all  Food Insecurity: No Food  Insecurity   Worried About Charity fundraiser in the Last Year: Never true   [place] in the Last Year: Never true  Transportation Needs: No Transportation Needs   Lack of Transportation (Medical): No   Lack of Transportation (Non-Medical): No  Physical Activity: Insufficiently Active   Days of Exercise per Week: 3 days   Minutes of Exercise per Session: 30 min  Stress: No Stress Concern Present   Feeling of Stress : Only a little  Social Connections: Moderately Integrated   Frequency of Communication with Friends and Family: More than three times a week   Frequency of Social Gatherings with Friends and Family: Once a week   Attends Religious Services: More than 4 times per year   Active Member of [employer] or Organizations: No   Attends Music therapist: Never   Marital Status: Married    Tobacco Counseling Counseling given: Not Answered Comment: quit over 50 years   Clinical Intake:  Pre-visit preparation completed: Yes  Pain : No/denies pain     BMI - recorded: 23.4 Nutritional Status: BMI of 19-24  Normal Nutritional Risks: None Diabetes: No  How often do you need to have someone help you when you read instructions, pamphlets, or other written materials from your doctor or pharmacy?: 1 - Never    Interpreter Needed?: No  Information entered by :: Clemetine Marker LPN   Activities of Daily Living In your present state of health, do you have any difficulty performing the following activities: 11/19/2020  Hearing? Y  Comment declines hearing aids  Vision? N  Difficulty concentrating or making decisions? N  Walking or climbing stairs? N  Dressing or bathing? N  Doing errands, shopping? N  Preparing Food and eating ? N  Using the Toilet? N  In the past six months, have you accidently leaked urine? N  Do you have problems with loss of bowel control? N  Managing your Medications? N  Managing your Finances? N  Housekeeping or managing  your Housekeeping? N  Some recent data might be hidden    Patient Care Team: Juline Patch, MD as PCP - General (Family Medicine) Yolonda Kida, MD as Consulting Physician (Cardiology)  Indicate any recent Medical Services you may have received from other than [facility] providers in the past year (date may be approximate).     Assessment:   This is a routine wellness examination for Marie Villa.  Hearing/Vision screen  Hearing Screening   '125Hz'$  '250Hz'$  '500Hz'$  '1000Hz'$  '2000Hz'$  '3000Hz'$  '4000Hz'$  '6000Hz'$  '8000Hz'$   Right ear:           Left ear:           Comments: Pt c/o mild hearing difficulty; declines hearing aids   Vision Screening Comments: Past due  for eye exam at [facility] Dr. Wallace Going  Dietary issues and exercise activities discussed: Current Exercise Habits: Home exercise routine, Type of exercise: walking, Time (Minutes): 30, Frequency (Times/Week): 3, Weekly Exercise (Minutes/Week): 90, Intensity: Mild, Exercise limited by: respiratory conditions(s)  Goals     DIET - INCREASE WATER INTAKE     Recommend drinking 6-8 glasses of water per day      Increase physical activity     Pt would like to increase physical activity and start walking again up to 3 days per week      Depression Screen PHQ 2/9 Scores 11/19/2020 08/07/2020 05/02/2020 04/16/2020 12/21/2019 11/16/2019 11/14/2019  PHQ - 2 Score 0 0 0 0 0 0 0  PHQ- 9 Score - 0 0 0 0 - 0    Fall Risk Fall Risk  11/19/2020 08/07/2020 05/02/2020 04/16/2020 12/21/2019  Falls in the past year? 0 0 0 0 0  Number falls in past yr: 0 - - - -  Injury with Fall? 0 - - - -  Risk for fall due to : No Fall Risks - - - -  Follow up Falls prevention discussed Falls evaluation completed Falls evaluation completed Falls evaluation completed Falls evaluation completed    [place]:  Any stairs in or around the home? Yes  If so, are there any without handrails? No  Home free of loose throw rugs in walkways, pet  beds, electrical cords, etc? Yes  Adequate lighting in your home to reduce risk of falls? Yes   ASSISTIVE DEVICES UTILIZED TO PREVENT FALLS:  Life alert? No  Use of a cane, walker or w/c? No  Grab bars in the bathroom? Yes  Shower chair or bench in shower? Yes  Elevated toilet seat or a handicapped toilet? Yes   TIMED UP AND GO:  Was the test performed? Yes .  Length of time to ambulate 10 feet: 4 sec.   Gait steady and fast without use of assistive device  Cognitive Function: Normal cognitive status assessed by direct observation by this Nurse Health Advisor. No abnormalities found.       6CIT Screen 11/16/2019  What Year? 0 points  What month? 0 points  What time? 0 points  Count back from 20 0 points  Months in reverse 0 points  Repeat phrase 2 points  Total Score 2    Immunizations Immunization History  Administered Date(s) Administered   Fluad Quad(high Dose 65+) 06/02/2019, 06/13/2020   Influenza,inj,Quad PF,6+ Mos 06/06/2015, 06/09/2016, 06/17/2017, 06/02/2018   Moderna Sars-Covid-2 Vaccination 09/20/2019, 10/19/2019, 04/08/2020   Pneumococcal Conjugate-13 06/06/2015   Pneumococcal Polysaccharide-23 06/09/2016   Tdap 12/27/2012   Zoster Recombinat (Shingrix) 06/02/2018, 08/15/2018    TDAP status: Up to date  Flu Vaccine status: Up to date  Pneumococcal vaccine status: Up to date  Covid-19 vaccine status: Completed vaccines   Qualifies for Shingles Vaccine? Yes   Zostavax completed Yes   Shingrix Completed?: Yes  Screening Tests Health Maintenance  Topic Date Due   TETANUS/TDAP  12/28/2022   INFLUENZA VACCINE  Completed   DEXA SCAN  Completed   COVID-19 Vaccine  Completed   PNA vac Low Risk Adult  Completed   HPV VACCINES  Aged Out    Health Maintenance  There are no preventive care reminders to display for this patient.  Colorectal cancer screening: No longer required.   Mammogram status: Completed 06/13/20. Repeat every  year  Bone Density status: Completed 05/18/13. Results  reflect: Bone density results: OSTEOPENIA. Repeat every 2 years. pt declines repeat screening at this time.    Lung Cancer Screening: (Low Dose CT Chest recommended if Age 35-80 years, 30 pack-year currently smoking OR have quit w/in 15years.) does not qualify.   Additional Screening:  Hepatitis C Screening: does not qualify.   Vision Screening: Recommended annual ophthalmology exams for early detection of glaucoma and other disorders of the eye. Is the patient up to date with their annual eye exam?  No  Who is the provider or what is the name of the office in which the patient attends annual eye exams? [place] Screening: Recommended annual dental exams for proper oral hygiene  Community Resource Referral / Chronic Care Management: CRR required this visit?  No   CCM required this visit?  No      Plan:     I have personally reviewed and noted the following in the patients chart:    Medical and social history  Use of alcohol, tobacco or illicit drugs   Current medications and supplements  Functional ability and status  Nutritional status  Physical activity  Advanced directives  List of other physicians  Hospitalizations, surgeries, and ER visits in previous 12 months  Vitals  Screenings to include cognitive, depression, and falls  Referrals and appointments  In addition, I have reviewed and discussed with patient certain preventive protocols, quality metrics, and best practice recommendations. A written personalized care plan for preventive services as well as general preventive health recommendations were provided to patient.      Clemetine Marker, LPN   [id number]   Nurse Notes: none

## 2020-11-19 NOTE — Patient Instructions (Signed)
Marie Villa , Thank you for taking time to come for your Medicare Wellness Visit. I appreciate your ongoing commitment to your health goals. Please review the following plan we discussed and let me know if I can assist you in the future.   Screening recommendations/referrals: Colonoscopy: no longer required Mammogram: done 06/13/20 Bone Density: done 05/18/13 Recommended yearly ophthalmology/optometry visit for glaucoma screening and checkup Recommended yearly dental visit for hygiene and checkup  Vaccinations: Influenza vaccine: done 06/13/20 Pneumococcal vaccine: done 06/09/16 Tdap vaccine: done 12/27/12 Shingles vaccine: done 06/02/18 & 08/15/18   Covid-19: done 09/20/19, 10/19/19 & 04/08/20  Advanced directives: Please bring a copy of your health care power of attorney and living will to the office at your convenience.  Conditions/risks identified: Recommend drinking 6-8 glasses of water per day   Next appointment: Follow up in one year for your annual wellness visit    Preventive Care 65 Years and Older, Female Preventive care refers to lifestyle choices and visits with your health care provider that can promote health and wellness. What does preventive care include?  A yearly physical exam. This is also called an annual well check.  Dental exams once or twice a year.  Routine eye exams. Ask your health care provider how often you should have your eyes checked.  Personal lifestyle choices, including:  Daily care of your teeth and gums.  Regular physical activity.  Eating a healthy diet.  Avoiding tobacco and drug use.  Limiting alcohol use.  Practicing safe sex.  Taking low-dose aspirin every day.  Taking vitamin and mineral supplements as recommended by your health care provider. What happens during an annual well check? The services and screenings done by your health care provider during your annual well check will depend on your age, overall health, lifestyle risk  factors, and family history of disease. Counseling  Your health care provider may ask you questions about your:  Alcohol use.  Tobacco use.  Drug use.  Emotional well-being.  Home and relationship well-being.  Sexual activity.  Eating habits.  History of falls.  Memory and ability to understand (cognition).  Work and work Statistician.  Reproductive health. Screening  You may have the following tests or measurements:  Height, weight, and BMI.  Blood pressure.  Lipid and cholesterol levels. These may be checked every 5 years, or more frequently if you are over 75 years old.  Skin check.  Lung cancer screening. You may have this screening every year starting at age 83 if you have a 30-pack-year history of smoking and currently smoke or have quit within the past 15 years.  Fecal occult blood test (FOBT) of the stool. You may have this test every year starting at age 81.  Flexible sigmoidoscopy or colonoscopy. You may have a sigmoidoscopy every 5 years or a colonoscopy every 10 years starting at age 81.  Hepatitis C blood test.  Hepatitis B blood test.  Sexually transmitted disease (STD) testing.  Diabetes screening. This is done by checking your blood sugar (glucose) after you have not eaten for a while (fasting). You may have this done every 1-3 years.  Bone density scan. This is done to screen for osteoporosis. You may have this done starting at age 76.  Mammogram. This may be done every 1-2 years. Talk to your health care provider about how often you should have regular mammograms. Talk with your health care provider about your test results, treatment options, and if necessary, the need for more tests. Vaccines  Your health care provider may recommend certain vaccines, such as:  Influenza vaccine. This is recommended every year.  Tetanus, diphtheria, and acellular pertussis (Tdap, Td) vaccine. You may need a Td booster every 10 years.  Zoster vaccine. You  may need this after age 26.  Pneumococcal 13-valent conjugate (PCV13) vaccine. One dose is recommended after age 24.  Pneumococcal polysaccharide (PPSV23) vaccine. One dose is recommended after age 3. Talk to your health care provider about which screenings and vaccines you need and how often you need them. This information is not intended to replace advice given to you by your health care provider. Make sure you discuss any questions you have with your health care provider. Document Released: 09/21/2015 Document Revised: 05/14/2016 Document Reviewed: 06/26/2015 Elsevier Interactive Patient Education  2017 [place] Prevention in the Home Falls can cause injuries. They can happen to people of all ages. There are many things you can do to make your home safe and to help prevent falls. What can I do on the outside of my home?  Regularly fix the edges of walkways and driveways and fix any cracks.  Remove anything that might make you trip as you walk through a door, such as a raised step or threshold.  Trim any bushes or trees on the path to your home.  Use bright outdoor lighting.  Clear any walking paths of anything that might make someone trip, such as rocks or tools.  Regularly check to see if handrails are loose or broken. Make sure that both sides of any steps have handrails.  Any raised decks and porches should have guardrails on the edges.  Have any leaves, snow, or ice cleared regularly.  Use sand or salt on walking paths during winter.  Clean up any spills in your garage right away. This includes oil or grease spills. What can I do in the bathroom?  Use night lights.  Install grab bars by the toilet and in the tub and shower. Do not use towel bars as grab bars.  Use non-skid mats or decals in the tub or shower.  If you need to sit down in the shower, use a plastic, non-slip stool.  Keep the floor dry. Clean up any water that spills on the floor as soon as  it happens.  Remove soap buildup in the tub or shower regularly.  Attach bath mats securely with double-sided non-slip rug tape.  Do not have throw rugs and other things on the floor that can make you trip. What can I do in the bedroom?  Use night lights.  Make sure that you have a light by your bed that is easy to reach.  Do not use any sheets or blankets that are too big for your bed. They should not hang down onto the floor.  Have a firm chair that has side arms. You can use this for support while you get dressed.  Do not have throw rugs and other things on the floor that can make you trip. What can I do in the kitchen?  Clean up any spills right away.  Avoid walking on wet floors.  Keep items that you use a lot in easy-to-reach places.  If you need to reach something above you, use a strong step stool that has a grab bar.  Keep electrical cords out of the way.  Do not use floor polish or wax that makes floors slippery. If you must use wax, use non-skid floor wax.  Do  not have throw rugs and other things on the floor that can make you trip. What can I do with my stairs?  Do not leave any items on the stairs.  Make sure that there are handrails on both sides of the stairs and use them. Fix handrails that are broken or loose. Make sure that handrails are as long as the stairways.  Check any carpeting to make sure that it is firmly attached to the stairs. Fix any carpet that is loose or worn.  Avoid having throw rugs at the top or bottom of the stairs. If you do have throw rugs, attach them to the floor with carpet tape.  Make sure that you have a light switch at the top of the stairs and the bottom of the stairs. If you do not have them, ask someone to add them for you. What else can I do to help prevent falls?  Wear shoes that:  Do not have high heels.  Have rubber bottoms.  Are comfortable and fit you well.  Are closed at the toe. Do not wear sandals.  If  you use a stepladder:  Make sure that it is fully opened. Do not climb a closed stepladder.  Make sure that both sides of the stepladder are locked into place.  Ask someone to hold it for you, if possible.  Clearly mark and make sure that you can see:  Any grab bars or handrails.  First and last steps.  Where the edge of each step is.  Use tools that help you move around (mobility aids) if they are needed. These include:  Canes.  Walkers.  Scooters.  Crutches.  Turn on the lights when you go into a dark area. Replace any light bulbs as soon as they burn out.  Set up your furniture so you have a clear path. Avoid moving your furniture around.  If any of your floors are uneven, fix them.  If there are any pets around you, be aware of where they are.  Review your medicines with your doctor. Some medicines can make you feel dizzy. This can increase your chance of falling. Ask your doctor what other things that you can do to help prevent falls. This information is not intended to replace advice given to you by your health care provider. Make sure you discuss any questions you have with your health care provider. Document Released: 06/21/2009 Document Revised: 01/31/2016 Document Reviewed: 09/29/2014 Elsevier Interactive Patient Education  2017 [employer].

## 2020-11-21 ENCOUNTER — Other Ambulatory Visit: Payer: Self-pay | Admitting: Family Medicine

## 2020-11-21 DIAGNOSIS — J441 Chronic obstructive pulmonary disease with (acute) exacerbation: Secondary | ICD-10-CM

## 2020-11-22 ENCOUNTER — Other Ambulatory Visit: Payer: Self-pay

## 2020-11-22 DIAGNOSIS — J441 Chronic obstructive pulmonary disease with (acute) exacerbation: Secondary | ICD-10-CM

## 2020-11-22 MED ORDER — FLUTICASONE-SALMETEROL 250-50 MCG/DOSE IN AEPB: 1.0000 | INHALATION_SPRAY | Freq: Two times a day (BID) | RESPIRATORY_TRACT | 5 refills | Status: DC

## 2020-12-06 ENCOUNTER — Other Ambulatory Visit: Payer: Self-pay

## 2020-12-06 ENCOUNTER — Emergency Department: Payer: Medicare HMO

## 2020-12-06 ENCOUNTER — Encounter: Payer: Self-pay | Admitting: Emergency Medicine

## 2020-12-06 ENCOUNTER — Emergency Department
Admission: EM | Admit: 2020-12-06 | Discharge: 2020-12-06 | Disposition: A | Discharge status: Home / Self Care | Payer: Medicare HMO | Attending: Emergency Medicine | Admitting: Emergency Medicine

## 2020-12-06 DIAGNOSIS — G4489 Other headache syndrome: Secondary | ICD-10-CM | POA: Diagnosis not present

## 2020-12-06 DIAGNOSIS — S0990XA Unspecified injury of head, initial encounter: Secondary | ICD-10-CM | POA: Diagnosis present

## 2020-12-06 DIAGNOSIS — J321 Chronic frontal sinusitis: Secondary | ICD-10-CM | POA: Diagnosis not present

## 2020-12-06 DIAGNOSIS — N183 Chronic kidney disease, stage 3 unspecified: Secondary | ICD-10-CM | POA: Insufficient documentation

## 2020-12-06 DIAGNOSIS — Z23 Encounter for immunization: Secondary | ICD-10-CM | POA: Insufficient documentation

## 2020-12-06 DIAGNOSIS — J449 Chronic obstructive pulmonary disease, unspecified: Secondary | ICD-10-CM | POA: Insufficient documentation

## 2020-12-06 DIAGNOSIS — I6523 Occlusion and stenosis of bilateral carotid arteries: Secondary | ICD-10-CM | POA: Diagnosis not present

## 2020-12-06 DIAGNOSIS — W010XXA Fall on same level from slipping, tripping and stumbling without subsequent striking against object, initial encounter: Secondary | ICD-10-CM | POA: Insufficient documentation

## 2020-12-06 DIAGNOSIS — M795 Residual foreign body in soft tissue: Secondary | ICD-10-CM | POA: Diagnosis not present

## 2020-12-06 DIAGNOSIS — Z7951 Long term (current) use of inhaled steroids: Secondary | ICD-10-CM | POA: Insufficient documentation

## 2020-12-06 DIAGNOSIS — I130 Hypertensive heart and chronic kidney disease with heart failure and stage 1 through stage 4 chronic kidney disease, or unspecified chronic kidney disease: Secondary | ICD-10-CM | POA: Insufficient documentation

## 2020-12-06 DIAGNOSIS — W19XXXA Unspecified fall, initial encounter: Secondary | ICD-10-CM | POA: Diagnosis not present

## 2020-12-06 DIAGNOSIS — I509 Heart failure, unspecified: Secondary | ICD-10-CM | POA: Diagnosis not present

## 2020-12-06 DIAGNOSIS — S0101XA Laceration without foreign body of scalp, initial encounter: Secondary | ICD-10-CM | POA: Diagnosis not present

## 2020-12-06 DIAGNOSIS — S8001XA Contusion of right knee, initial encounter: Secondary | ICD-10-CM | POA: Insufficient documentation

## 2020-12-06 DIAGNOSIS — Z79899 Other long term (current) drug therapy: Secondary | ICD-10-CM | POA: Diagnosis not present

## 2020-12-06 DIAGNOSIS — S0181XA Laceration without foreign body of other part of head, initial encounter: Secondary | ICD-10-CM

## 2020-12-06 DIAGNOSIS — S0182XA Laceration with foreign body of other part of head, initial encounter: Secondary | ICD-10-CM | POA: Insufficient documentation

## 2020-12-06 DIAGNOSIS — J3489 Other specified disorders of nose and nasal sinuses: Secondary | ICD-10-CM | POA: Diagnosis not present

## 2020-12-06 DIAGNOSIS — R0902 Hypoxemia: Secondary | ICD-10-CM | POA: Diagnosis not present

## 2020-12-06 DIAGNOSIS — Y9301 Activity, walking, marching and hiking: Secondary | ICD-10-CM | POA: Insufficient documentation

## 2020-12-06 DIAGNOSIS — M11261 Other chondrocalcinosis, right knee: Secondary | ICD-10-CM | POA: Diagnosis not present

## 2020-12-06 DIAGNOSIS — J320 Chronic maxillary sinusitis: Secondary | ICD-10-CM | POA: Diagnosis not present

## 2020-12-06 DIAGNOSIS — Z87891 Personal history of nicotine dependence: Secondary | ICD-10-CM | POA: Insufficient documentation

## 2020-12-06 DIAGNOSIS — M542 Cervicalgia: Secondary | ICD-10-CM | POA: Diagnosis not present

## 2020-12-06 DIAGNOSIS — J342 Deviated nasal septum: Secondary | ICD-10-CM | POA: Diagnosis not present

## 2020-12-06 MED ORDER — CEPHALEXIN 500 MG PO CAPS
1000.0000 mg | ORAL_CAPSULE | Freq: Once | ORAL | Status: AC
  Administered 2020-12-06: 1000 mg via ORAL
  Filled 2020-12-06: qty 2

## 2020-12-06 MED ORDER — LIDOCAINE-EPINEPHRINE-TETRACAINE (LET) TOPICAL GEL
3.0000 mL | Freq: Once | TOPICAL | Status: AC
  Administered 2020-12-06: 3 mL via TOPICAL
  Filled 2020-12-06: qty 3

## 2020-12-06 MED ORDER — MUPIROCIN CALCIUM 2 % EX CREA
TOPICAL_CREAM | Freq: Once | CUTANEOUS | Status: AC
  Administered 2020-12-06: 1 via TOPICAL
  Filled 2020-12-06 (×2): qty 15

## 2020-12-06 MED ORDER — ACETAMINOPHEN 325 MG PO TABS
650.0000 mg | ORAL_TABLET | Freq: Once | ORAL | Status: AC
  Administered 2020-12-06: 650 mg via ORAL
  Filled 2020-12-06: qty 2

## 2020-12-06 MED ORDER — LIDOCAINE-EPINEPHRINE (PF) 2 %-1:200000 IJ SOLN
20.0000 mL | Freq: Once | INTRAMUSCULAR | Status: AC
  Administered 2020-12-06: 20 mL via INTRADERMAL
  Filled 2020-12-06: qty 20

## 2020-12-06 MED ORDER — TETANUS-DIPHTH-ACELL PERTUSSIS 5-2.5-18.5 LF-MCG/0.5 IM SUSY
0.5000 mL | PREFILLED_SYRINGE | Freq: Once | INTRAMUSCULAR | Status: AC
  Administered 2020-12-06: 0.5 mL via INTRAMUSCULAR
  Filled 2020-12-06: qty 0.5

## 2020-12-06 MED ORDER — CEPHALEXIN 500 MG PO CAPS: 500.0000 mg | ORAL_CAPSULE | Freq: Four times a day (QID) | ORAL | 0 refills | Status: AC

## 2020-12-06 NOTE — Discharge Instructions (Signed)
Please follow up with your provider in 5-7 days for removal. Please take antibiotic as prescribed. Return to ER with any worsening.

## 2020-12-06 NOTE — ED Triage Notes (Signed)
Presents s/p fall  States she bend down to get her newspaper  [address] forward  Laceration/avulsion to forehead  No LOC

## 2020-12-06 NOTE — ED Provider Notes (Signed)
[facility] Emergency Department Provider Note  ____________________________________________   Event Date/Time   First MD Initiated Contact with Patient 12/06/20 [id number]     (approximate)  I have reviewed the triage vital signs and the nursing notes.   HISTORY  Chief Complaint Fall  HPI Marie Villa is a 80 y.o. female who presents to the emergency department via EMS for evaluation of mechanical fall that occurred this morning.  Patient states that she was walking out to get her newspaper when her feet got tangled and she tripped, landing directly on the gravel of her driveway.  There was a nearby temper, but does not believe that she hit this.  She denies loss of consciousness, nausea, vomiting.  She just notes significant bleeding to the head.  She walked inside, was able to awake her husband who took her to urgent care, however they told her that she needed to be seen by the emergency department.  They then returned home, and EMS picked her up at home.  She reports headache, denies any weakness or paresthesias.  She does report that she struck the anterior aspect of her right knee as well, however she denies any other locations of pain.        Past Medical History:  Diagnosis Date  . Congestive heart disease ([place])   . COPD (chronic obstructive pulmonary disease) ([place])   . GERD (gastroesophageal reflux disease)   . Hyperlipidemia   . Hypertension     Patient Active Problem List   Diagnosis Date Noted  . Primary insomnia 10/22/2020  . Left knee pain 11/25/2018  . Primary osteoarthritis of left knee 11/25/2018  . Essential hypertension 10/12/2017  . COPD (chronic obstructive pulmonary disease) ([place]) 04/10/2017  . Simple chronic bronchitis ([place]) 06/09/2016  . Other seasonal allergic rhinitis 06/09/2016  . Cardiomyopathy ([place]) 06/06/2015  . Chronic kidney disease, stage 3b ([place]) 06/06/2015  . CCF (congestive cardiac failure) ([place]) 06/06/2015  . CAFL  (chronic airflow limitation) ([place]) 06/06/2015  . Gastroesophageal reflux disease 06/06/2015  . Hypercholesteremia 06/06/2015  . Cardiac murmur 12/16/2013  . SOB (shortness of breath) 12/16/2013    Past Surgical History:  Procedure Laterality Date  . CATARACT EXTRACTION, BILATERAL    . COLONOSCOPY  2012   normal- cleared for 10- [facility]  . VAGINAL HYSTERECTOMY      Prior to Admission medications   Medication Sig Start Date End Date Taking? Authorizing Provider  cephALEXin (KEFLEX) 500 MG capsule Take 1 capsule (500 mg total) by mouth 4 (four) times daily for 10 days. 12/06/20 12/16/20 Yes Marlana Salvage, PA  acetaminophen (TYLENOL) 325 MG tablet Take 325 mg by mouth as needed.    [provider]  amitriptyline (ELAVIL) 10 MG tablet Take 1 tablet (10 mg total) by mouth at bedtime. 10/22/20   Juline Patch, MD  atorvastatin (LIPITOR) 20 MG tablet Take 1 tablet (20 mg total) by mouth daily. 10/22/20   Juline Patch, MD  Calcium Carb-Cholecalciferol (CALCIUM-VITAMIN D) 600-400 MG-UNIT TABS Take 2 tablets by mouth daily.    [provider]  carvedilol (COREG) 3.125 MG tablet Take 1 tablet (3.125 mg total) by mouth 2 (two) times daily. [place] 10/22/20   Juline Patch, MD  cetirizine (ZYRTEC) 10 MG tablet TAKE 1 TABLET(10 MG) BY MOUTH DAILY 11/13/20   Juline Patch, MD  famotidine (PEPCID) 40 MG tablet Take 1 tablet (40 mg total) by mouth daily. 10/22/20   Juline Patch, MD  ferrous sulfate 324 MG TBEC Take 324 mg by mouth daily.    [provider]  fluticasone (FLONASE) 50 MCG/ACT nasal spray SHAKE LIQUID AND USE 1 SPRAY IN EACH NOSTRIL TWICE DAILY 10/22/20   Juline Patch, MD  Fluticasone-Salmeterol [facility] INHUB) 250-50 MCG/DOSE AEPB Inhale 1 puff into the lungs 2 (two) times daily. 11/22/20   Juline Patch, MD  Glucos-Chond-Hyal Ac-Ca Fructo (MOVE FREE JOINT HEALTH ADVANCE PO) Take 1 tablet by mouth daily.    [provider]  MEGARED OMEGA-3  KRILL OIL PO Take 1 capsule by mouth daily.    [provider]  meloxicam (MOBIC) 7.5 MG tablet  05/24/20   [provider]  omeprazole (PRILOSEC) 20 MG capsule Take 1 capsule (20 mg total) by mouth daily. 10/22/20   Juline Patch, MD  ramipril (ALTACE) 2.5 MG capsule Take 1 capsule (2.5 mg total) by mouth daily. Dr [place] Bigness 11/26/18   Juline Patch, MD  vitamin B-12 (CYANOCOBALAMIN) 1000 MCG tablet Take 1,000 mcg by mouth daily.    [provider]    Allergies Sulfa antibiotics  Family History  Problem Relation Age of Onset  . Breast cancer Neg Hx     Social History Social History   Tobacco Use  . Smoking status: Former Smoker    Types: Cigarettes  . Smokeless tobacco: Never Used  . Tobacco comment: quit over 50 years  Vaping Use  . Vaping Use: Never used  Substance Use Topics  . Alcohol use: Yes    Alcohol/week: 1.0 standard drink    Types: 1 Cans of beer per week    Comment: occasionally  . Drug use: No    Review of Systems Constitutional: No fever/chills Eyes: No visual changes. ENT: No sore throat. Cardiovascular: Denies chest pain. Respiratory: Denies shortness of breath. Gastrointestinal: No abdominal pain.  No nausea, no vomiting.  No diarrhea.  No constipation. Genitourinary: Negative for dysuria. Musculoskeletal: + Right knee pain, negative for back pain. Skin: Negative for rash. Neurological: + headaches, negative for focal weakness or numbness.  ____________________________________________   PHYSICAL EXAM:  VITAL SIGNS: ED Triage Vitals  Enc Vitals Group     BP 12/06/20 0935 125/78     Pulse Rate 12/06/20 0935 85     Resp 12/06/20 0935 18     Temp 12/06/20 0935 98 F (36.7 C)     Temp Source 12/06/20 0935 Oral     SpO2 12/06/20 0935 97 %     Weight 12/06/20 0929 141 lb 1.5 oz (64 kg)     Height 12/06/20 0929 '5\' 5"'$  (1.651 m)     Head Circumference --      Peak Flow --      Pain Score 12/06/20 0936 5     Pain Loc  --      Pain Edu? --      Excl. in [place]? --    Constitutional: Alert and oriented. Well appearing and in no acute distress. Eyes: Conjunctivae are normal. PERRL. EOMI. no evidence of globe trauma.  There is periorbital ecchymosis and tenderness. Head: There is a very large laceration to the midline and left side of the forehead and into the scalp.  The laceration is approximately 12 cm long and does cross the hairline.  Off of this laceration, there is a small area where it does splinter off, creating an another 2 cm deficit..  There is faint area of exposed skull, full-thickness laceration.  In addition to the  large laceration above, there is another more superficial laceration further left that is approximately 2 to 3 cm. Nose: No congestion/rhinnorhea. Mouth/Throat: Mucous membranes are moist.  Neck: No stridor.  On initial exam, c-collar is in place from EMS.  On repeat exam after CT findings, there is no tenderness to the midline or paraspinal of the cervical spine. Cardiovascular: No chest wall ecchymosis.  Normal rate, regular rhythm. Grossly normal heart sounds.  Good peripheral circulation. Respiratory: Normal respiratory effort.  No retractions. Lungs CTAB. Gastrointestinal: No abdominal ecchymosis.  Soft and nontender. No distention. No abdominal bruits. No CVA tenderness. Musculoskeletal: Mild superficial abrasions to the anterior aspect of the right knee.  No appreciable knee swelling.  Range of motion full without difficulty.  Negative logroll bilaterally, no pain with manipulation of the pelvis bilaterally. Neurologic:  Normal speech and language.  Cranial nerves II through XII grossly intact.  No gross focal neurologic deficits are appreciated. No gait instability. Skin:  Skin is warm, dry and intact except as described above. No rash noted. Psychiatric: Mood and affect are normal. Speech and behavior are normal.  ____________________________________________  RADIOLOGY I, Marlana Salvage, personally viewed and evaluated these images (plain radiographs) as part of my medical decision making, as well as reviewing the written report by the radiologist.  ED provider interpretation: X-rays of the right knee reveal no acute fracture, there are a few punctate areas on the superficial aspect, suspicious for foreign bodies.  See radiology report for CT findings of the head face and neck  Official radiology report(s): CT Head Wo Contrast  Result Date: 12/06/2020 CLINICAL DATA:  Moderate to severe head trauma. Fall. Laceration of forehead. EXAM: CT HEAD WITHOUT CONTRAST TECHNIQUE: Contiguous axial images were obtained from the base of the skull through the vertex without intravenous contrast. COMPARISON:  None. FINDINGS: Brain: No evidence of acute infarction, hemorrhage, hydrocephalus, extra-axial collection or mass lesion/mass effect. Periventricular white matter hypodensity is a nonspecific finding, but most commonly relates to chronic ischemic small vessel disease. Vascular: No hyperdense vessel or unexpected calcification. Skull: Normal. Negative for fracture or focal lesion. Sinuses/Orbits: Opacification of the left frontal sinus. Partial opacification of the maxillary and sphenoid sinuses, and ethmoid air cells. Other: Left anterior scalp laceration is noted. Mild supraorbital soft tissue swelling. Small metallic density foreign body noted on the surface of the right face soft tissues (image 5, series 2). This may be foreign body. Please correlate with exam. IMPRESSION: No acute intracranial abnormality. Possible foreign body of the right face. Please correlate with physical exam. Electronically Signed   By: Miachel Roux M.D.   On: 12/06/2020 10:20   CT Cervical Spine Wo Contrast  Result Date: 12/06/2020 CLINICAL DATA:  Neck pain after a fall.  Initial encounter. EXAM: CT CERVICAL SPINE WITHOUT CONTRAST TECHNIQUE: Multidetector CT imaging of the cervical spine was performed without  intravenous contrast. Multiplanar CT image reconstructions were also generated. COMPARISON:  None. FINDINGS: Alignment: Facet degenerative disease results in 0.2 cm anterolisthesis C4 on C5 and 0.3 cm anterolisthesis C5 on C6. Skull base and vertebrae: No acute fracture. No primary bone lesion or focal pathologic process. Soft tissues and spinal canal: No prevertebral fluid or swelling. No visible canal hematoma. Disc levels: Loss of disc space height is worst at C6-7 and C7-T1. Advanced multilevel facet degenerative change is seen. Upper chest: Negative. Other: None. IMPRESSION: No acute abnormality. Multilevel spondylosis. Electronically Signed   By: Inge Rise M.D.   On: 12/06/2020 10:21  DG Knee Complete 4 Views Right  Result Date: 12/06/2020 CLINICAL DATA:  Knee pain after fall. EXAM: RIGHT KNEE - COMPLETE 4+ VIEW COMPARISON:  None. FINDINGS: No evidence of fracture, dislocation, or joint effusion. Chondrocalcinosis. Joint spaces are maintained. Small radiodensities in the superficial soft tissues along the patella. IMPRESSION: 1. No evidence of acute fracture or dislocation. 2. Small radiodensities in the superficial soft tissues along the patella, which could represent vascular calcifications versus foreign bodies. Recommend correlation with direct inspection. 3. Chondrocalcinosis. Electronically Signed   By: Margaretha Sheffield MD   On: 12/06/2020 10:54   CT Maxillofacial Wo Contrast  Result Date: 12/06/2020 CLINICAL DATA:  Pain following fall EXAM: CT MAXILLOFACIAL WITHOUT CONTRAST TECHNIQUE: Multidetector CT imaging of the maxillofacial structures was performed. Multiplanar CT image reconstructions were also generated. COMPARISON:  None. FINDINGS: Osseous: There is no appreciable fracture or dislocation. No blastic or lytic bone lesions. Orbits: There is preseptal soft tissue swelling on the left. No intraorbital lesion evident. No intraorbital asymmetry. Sinuses: There is opacification of a  hypoplastic left frontal sinus. There is opacification of multiple ethmoid air cells bilaterally. There is opacification throughout much of the right maxillary antrum with a much lesser degree of opacification in the left maxillary antrum. Moderate mucosal thickening is noted in the posterior sphenoid sinus region as well. No air-fluid levels. No bony destruction or expansion. There is obstruction of each ostiomeatal unit complex. There is mild rightward deviation of the nasal septum. Obstruction of the superior areas bilaterally noted due to soft tissue opacification. Soft tissues: There is a left frontal scalp hematoma with air in this area. Underlying bone intact. The salivary glands appear symmetric bilaterally. No adenopathy evident. There is calcification in each carotid artery. Tongue and tongue base regions appear normal. Visualized pharynx appears normal. Limited intracranial: There is mild diffuse atrophy. No focal intracranial lesion evident. Degenerative change noted in the visualized cervical spine with mild spondylolisthesis at C4-5 and C5-6 which may be due to spondylosis. IMPRESSION: No fracture or dislocation. There is preseptal soft tissue edema over the left orbit with nearby left frontal scalp hematoma containing air. No fracture in this region. No intraorbital lesions evident. 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 Mild rightward deviation of nasal septum. Bilateral carotid artery calcification noted. Electronically Signed   By: Lowella Grip III M.D.   On: 12/06/2020 10:20    ____________________________________________   PROCEDURES  Procedure(s) performed (including Critical Care):  Marland KitchenMarland KitchenLaceration Repair  Date/Time: 12/06/2020 4:01 PM Performed by: Marlana Salvage, PA Authorized by: Marlana Salvage, PA   Consent:    Consent obtained:  Verbal   Consent given by:  Patient   Risks, benefits, and alternatives were  discussed: yes     Risks discussed:  Infection, pain, retained foreign body, poor cosmetic result and need for additional repair Universal protocol:    Procedure explained and questions answered to patient or proxy's satisfaction: yes     Imaging studies available: yes     Patient identity confirmed:  Verbally with patient Anesthesia:    Anesthesia method:  Topical application and local infiltration   Topical anesthetic:  LET   Local anesthetic:  Lidocaine 1% WITH epi Laceration details:    Location:  Face   Face location:  Forehead   Length (cm):  14 (12 cm of linear area, additional 2 cm splintered area off in the middle)   Depth (mm):  10 Pre-procedure details:    Preparation:  Patient  was prepped and draped in usual sterile fashion and imaging obtained to evaluate for foreign bodies Exploration:    Hemostasis achieved with:  LET, epinephrine and direct pressure   Imaging obtained comment:  CT   Imaging outcome: foreign body noted     Wound exploration: wound explored through full range of motion     Wound extent: fascia violated and foreign bodies/material     Foreign bodies/material:  Punctate piece of gravel noted and removed, no other foreign bodies able to be visualized or located   Contaminated: yes   Treatment:    Area cleansed with:  Povidone-iodine and saline   Amount of cleaning:  Extensive   Irrigation solution:  Sterile saline   Irrigation method:  Syringe and tap   Visualized foreign bodies/material removed: yes   Skin repair:    Repair method:  Sutures and staples   Suture size:  5-0   Suture material:  Nylon   Suture technique:  Running locked   Number of sutures: There is singular linear running locked stitch with 14 throws, additional area with 3 simple interrupted.   Number of staples:  8 Approximation:    Approximation:  Close Repair type:    Repair type:  Intermediate Post-procedure details:    Dressing:  Open (no dressing)   Procedure completion:   Tolerated well, no immediate complications .Marland KitchenLaceration Repair  Date/Time: 12/06/2020 4:04 PM Performed by: Marlana Salvage, PA Authorized by: Marlana Salvage, PA   Consent:    Consent obtained:  Verbal   Consent given by:  Patient   Risks, benefits, and alternatives were discussed: yes     Risks discussed:  Infection, pain, retained foreign body and need for additional repair   Alternatives discussed:  No treatment Universal protocol:    Procedure explained and questions answered to patient or proxy's satisfaction: yes     Imaging studies available: yes     Patient identity confirmed:  Verbally with patient Anesthesia:    Anesthesia method:  None Laceration details:    Location:  Face   Face location:  Forehead   Length (cm):  2   Depth (mm):  5 Pre-procedure details:    Preparation:  Patient was prepped and draped in usual sterile fashion Exploration:    Hemostasis achieved with:  LET and epinephrine   Imaging obtained comment:  CT   Imaging outcome: foreign body noted     Contaminated: yes   Treatment:    Area cleansed with:  Povidone-iodine and saline   Amount of cleaning:  Extensive   Irrigation solution:  Sterile saline   Irrigation method:  Syringe and tap Skin repair:    Repair method:  Sutures   Suture size:  5-0   Suture technique:  Simple interrupted   Number of sutures:  3 Approximation:    Approximation:  Close Repair type:    Repair type:  Simple Post-procedure details:    Dressing:  Open (no dressing)       ____________________________________________   INITIAL IMPRESSION / ASSESSMENT AND PLAN / ED COURSE  As part of my medical decision making, I reviewed the following data within the [place] notes reviewed and incorporated, Radiograph reviewed and Notes from prior ED visits        Patient is an 80 year old female who presents to emergency department via EMS for evaluation of head injury after mechanical  fall.  See HPI for further details.  No physical exam, patient has extensive  laceration of the face into the scalp that creates the beginning of the flap.  See pictures above.  This was able to be repaired, see procedure notes for details.  The patient does not have any neurologic deficits on physical exam.  CT was obtained and is negative for intracranial bleed, cervical spine acute injury.  X-rays were obtained of the right knee and are negative for acute fracture.  The area was cleaned extensively, however no foreign bodies were identified for removal in the superficial aspect of the right knee.  We will update the patient's Tdap, place her on prophylactic Keflex for infection prevention given contamination as well as location on the face.  Advised the patient that she could be evaluated in as early as 5 days for suture removal but that they may have to stay in 7-10 given extent of the wound.  Patient is agreeable with plan.  Return precautions were discussed at length and she is stable this time for outpatient follow-up.        ____________________________________________   FINAL CLINICAL IMPRESSION(S) / ED DIAGNOSES  Final diagnoses:  Facial laceration, initial encounter  Fall, initial encounter  Contusion of right knee, initial encounter     ED Discharge Orders         Ordered    cephALEXin (KEFLEX) 500 MG capsule  4 times daily        12/06/20 1309          *Please note:  ETNA WIDNER was evaluated in Emergency Department on 12/06/2020 for the symptoms described in the history of present illness. She was evaluated in the context of the global COVID-19 pandemic, which necessitated consideration that the patient might be at risk for infection with the SARS-CoV-2 virus that causes COVID-19. Institutional protocols and algorithms that pertain to the evaluation of patients at risk for COVID-19 are in a state of rapid change based on information released by regulatory bodies including  the CDC and federal and state organizations. These policies and algorithms were followed during the patient's care in the ED.  Some ED evaluations and interventions may be delayed as a result of limited staffing during and the pandemic.*   Note:  This document was prepared using Dragon voice recognition software and may include unintentional dictation errors.   Marlana Salvage, PA 12/06/20 1609    Nance Pear, MD 12/06/20 [phone]

## 2020-12-17 ENCOUNTER — Ambulatory Visit (INDEPENDENT_AMBULATORY_CARE_PROVIDER_SITE_OTHER): Payer: Medicare HMO | Admitting: Family Medicine

## 2020-12-17 ENCOUNTER — Encounter: Payer: Self-pay | Admitting: Family Medicine

## 2020-12-17 ENCOUNTER — Other Ambulatory Visit: Payer: Self-pay

## 2020-12-17 VITALS — BP 108/72 | HR 108 | Temp 97.9°F | Ht 65.0 in | Wt 138.0 lb

## 2020-12-17 DIAGNOSIS — S0990XD Unspecified injury of head, subsequent encounter: Secondary | ICD-10-CM

## 2020-12-17 DIAGNOSIS — Z4802 Encounter for removal of sutures: Secondary | ICD-10-CM | POA: Diagnosis not present

## 2020-12-17 DIAGNOSIS — S0990XA Unspecified injury of head, initial encounter: Secondary | ICD-10-CM | POA: Insufficient documentation

## 2020-12-17 DIAGNOSIS — W1830XD Fall on same level, unspecified, subsequent encounter: Secondary | ICD-10-CM

## 2020-12-17 NOTE — Progress Notes (Signed)
New Patient Office Visit  Subjective:  Patient ID: Marie Villa, female    DOB: 08/15/1941  Age: 80 y.o. MRN: [id number]  CC:  Chief Complaint  Patient presents with  . Suture / Staple Removal    Sutures placed in left side of face from 12/06/20; taking Keflex 500 mg 4 times daily  . Fall    12/06/2020; seen in the Emergency Room the same day; denies pain anywhere    HPI Marie Villa presents for follow-up evaluation to head trauma sustained on 12/07/2019 after mechanical fall at her home where she sustained head / facial laceration necessitating ER visit, suture and staple placement. Imaging was obtained as recorded below.  Currently she denies headache, visual disturbance, pain from lacerations, balance issues, or issues with wound.    Past Medical History:  Diagnosis Date  . Congestive heart disease ([place])   . COPD (chronic obstructive pulmonary disease) ([place])   . GERD (gastroesophageal reflux disease)   . Hyperlipidemia   . Hypertension     Past Surgical History:  Procedure Laterality Date  . CATARACT EXTRACTION, BILATERAL    . COLONOSCOPY  2012   normal- cleared for 10- Marie Villa  . VAGINAL HYSTERECTOMY      Family History  Problem Relation Age of Onset  . Breast cancer Neg Hx     Social History   Socioeconomic History  . Marital status: Married    Spouse name: Zenobia Alderfer  . Number of children: 2  . Years of education: Not on file  . Highest education level: Not on file  Occupational History  . Occupation: Retired  Tobacco Use  . Smoking status: Former Smoker    Types: Cigarettes  . Smokeless tobacco: Never Used  . Tobacco comment: quit over 50 years  Vaping Use  . Vaping Use: Never used  Substance and Sexual Activity  . Alcohol use: Yes    Alcohol/week: 1.0 standard drink    Types: 1 Cans of beer per week  . Drug use: Never  . Sexual activity: Yes  Other Topics Concern  . Not on file  Social History Narrative  . Not on file   Social  Determinants of Health   Financial Resource Strain: Low Risk   . Difficulty of Paying Living Expenses: Not hard at all  Food Insecurity: No Food Insecurity  . Worried About Charity fundraiser in the Last Year: Never true  . Ran Out of Food in the Last Year: Never true  Transportation Needs: No Transportation Needs  . Lack of Transportation (Medical): No  . Lack of Transportation (Non-Medical): No  Physical Activity: Insufficiently Active  . Days of Exercise per Week: 3 days  . Minutes of Exercise per Session: 30 min  Stress: No Stress Concern Present  . Feeling of Stress : Only a little  Social Connections: Moderately Integrated  . Frequency of Communication with Friends and Family: More than three times a week  . Frequency of Social Gatherings with Friends and Family: Once a week  . Attends Religious Services: More than 4 times per year  . Active Member of Clubs or Organizations: No  . Attends Archivist Meetings: Never  . Marital Status: Married  Human resources officer Violence: Not At Risk  . Fear of Current or Ex-Partner: No  . Emotionally Abused: No  . Physically Abused: No  . Sexually Abused: No    ROS Review of Systems  HENT: Negative for facial swelling.   Eyes: Negative  for pain and visual disturbance.  Gastrointestinal: Negative for nausea and vomiting.  Skin: Positive for wound.  Neurological: Negative for dizziness, syncope, facial asymmetry, weakness, light-headedness, numbness and headaches.    Objective:   Today's Vitals: BP 108/72 (BP Location: Right Arm, Patient Position: Sitting, Cuff Size: Normal)   Pulse (!) 108   Temp 97.9 F (36.6 C) (Oral)   Ht '5\' 5"'$  (1.651 m)   Wt 138 lb (62.6 kg)   SpO2 96%   BMI 22.96 kg/m   Physical Exam  Physical Exam General: Well Developed, well nourished, and in no acute distress.  Neuro: Alert and oriented x3, extra-ocular muscles intact, sensation grossly intact. Cranial nerves II through XII are intact,  motor, sensory, and coordinative functions are grossly intact. HEENT: Normocephalic, tenderness at roughly 12 cm curvilinear left frontal / scalp laceration. Separate 3 cm left forehead laceration, pupils equal round reactive to light, neck supple, no masses, no lymphadenopathy, thyroid nonpalpable. Oropharynx, nasopharynx benign, no basilar ecchymoses.  Skin: Warm and dry, laceration wounds with dried blood, no drainage expressed, surrounding skin non-erythematous   CT Head Wo Contrast  Result Date: 12/06/2020 CLINICAL DATA:  Moderate to severe head trauma. Fall. Laceration of forehead. EXAM: CT HEAD WITHOUT CONTRAST TECHNIQUE: Contiguous axial images were obtained from the base of the skull through the vertex without intravenous contrast. COMPARISON:  None. FINDINGS: Brain: No evidence of acute infarction, hemorrhage, hydrocephalus, extra-axial collection or mass lesion/mass effect. Periventricular white matter hypodensity is a nonspecific finding, but most commonly relates to chronic ischemic small vessel disease. Vascular: No hyperdense vessel or unexpected calcification. Skull: Normal. Negative for fracture or focal lesion. Sinuses/Orbits: Opacification of the left frontal sinus. Partial opacification of the maxillary and sphenoid sinuses, and ethmoid air cells. Other: Left anterior scalp laceration is noted. Mild supraorbital soft tissue swelling. Small metallic density foreign body noted on the surface of the right face soft tissues (image 5, series 2). This may be foreign body. Please correlate with exam. IMPRESSION: No acute intracranial abnormality. Possible foreign body of the right face. Please correlate with physical exam. Electronically Signed   By: Miachel Roux M.D.   On: 12/06/2020 10:20   CT Cervical Spine Wo Contrast  Result Date: 12/06/2020 CLINICAL DATA:  Neck pain after a fall.  Initial encounter. EXAM: CT CERVICAL SPINE WITHOUT CONTRAST TECHNIQUE: Multidetector CT imaging of the  cervical spine was performed without intravenous contrast. Multiplanar CT image reconstructions were also generated. COMPARISON:  None. FINDINGS: Alignment: Facet degenerative disease results in 0.2 cm anterolisthesis C4 on C5 and 0.3 cm anterolisthesis C5 on C6. Skull base and vertebrae: No acute fracture. No primary bone lesion or focal pathologic process. Soft tissues and spinal canal: No prevertebral fluid or swelling. No visible canal hematoma. Disc levels: Loss of disc space height is worst at C6-7 and C7-T1. Advanced multilevel facet degenerative change is seen. Upper chest: Negative. Other: None. IMPRESSION: No acute abnormality. Multilevel spondylosis. Electronically Signed   By: Inge Rise M.D.   On: 12/06/2020 10:21   DG Knee Complete 4 Views Right  Result Date: 12/06/2020 CLINICAL DATA:  Knee pain after fall. EXAM: RIGHT KNEE - COMPLETE 4+ VIEW COMPARISON:  None. FINDINGS: No evidence of fracture, dislocation, or joint effusion. Chondrocalcinosis. Joint spaces are maintained. Small radiodensities in the superficial soft tissues along the patella. IMPRESSION: 1. No evidence of acute fracture or dislocation. 2. Small radiodensities in the superficial soft tissues along the patella, which could represent vascular calcifications versus foreign bodies. Recommend  correlation with direct inspection. 3. Chondrocalcinosis. Electronically Signed   By: Margaretha Sheffield MD   On: 12/06/2020 10:54   CT Maxillofacial Wo Contrast  Result Date: 12/06/2020 CLINICAL DATA:  Pain following fall EXAM: CT MAXILLOFACIAL WITHOUT CONTRAST TECHNIQUE: Multidetector CT imaging of the maxillofacial structures was performed. Multiplanar CT image reconstructions were also generated. COMPARISON:  None. FINDINGS: Osseous: There is no appreciable fracture or dislocation. No blastic or lytic bone lesions. Orbits: There is preseptal soft tissue swelling on the left. No intraorbital lesion evident. No intraorbital asymmetry.  Sinuses: There is opacification of a hypoplastic left frontal sinus. There is opacification of multiple ethmoid air cells bilaterally. There is opacification throughout much of the right maxillary antrum with a much lesser degree of opacification in the left maxillary antrum. Moderate mucosal thickening is noted in the posterior sphenoid sinus region as well. No air-fluid levels. No bony destruction or expansion. There is obstruction of each ostiomeatal unit complex. There is mild rightward deviation of the nasal septum. Obstruction of the superior areas bilaterally noted due to soft tissue opacification. Soft tissues: There is a left frontal scalp hematoma with air in this area. Underlying bone intact. The salivary glands appear symmetric bilaterally. No adenopathy evident. There is calcification in each carotid artery. Tongue and tongue base regions appear normal. Visualized pharynx appears normal. Limited intracranial: There is mild diffuse atrophy. No focal intracranial lesion evident. Degenerative change noted in the visualized cervical spine with mild spondylolisthesis at C4-5 and C5-6 which may be due to spondylosis. IMPRESSION: No fracture or dislocation. There is preseptal soft tissue edema over the left orbit with nearby left frontal scalp hematoma containing air. No fracture in this region. No intraorbital lesions evident. 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 Mild rightward deviation of nasal septum. Bilateral carotid artery calcification noted. Electronically Signed   By: Lowella Grip III M.D.   On: 12/06/2020 10:20   Suture Removal Indication: sutures placed in ER Verbal consent obtained. Location: left forehead and scalp Size: 12 cm, separate 2-3 cm Anesthesia: none Wound well healed Tolerated well Routine postprocedure instructions d/w pt  Assessment & Plan:   Problem List Items Addressed This Visit      Other   Head trauma    Visit for suture removal - Primary      Outpatient Encounter Medications as of 12/17/2020  Medication Sig  . acetaminophen (TYLENOL) 325 MG tablet Take 325 mg by mouth as needed.  Marland Kitchen amitriptyline (ELAVIL) 10 MG tablet Take 1 tablet (10 mg total) by mouth at bedtime.  Marland Kitchen atorvastatin (LIPITOR) 20 MG tablet Take 1 tablet (20 mg total) by mouth daily.  . Calcium Carb-Cholecalciferol (CALCIUM-VITAMIN D) 600-400 MG-UNIT TABS Take 2 tablets by mouth daily.  . carvedilol (COREG) 3.125 MG tablet Take 1 tablet (3.125 mg total) by mouth 2 (two) times daily. [place]  . cetirizine (ZYRTEC) 10 MG tablet TAKE 1 TABLET(10 MG) BY MOUTH DAILY  . famotidine (PEPCID) 40 MG tablet Take 1 tablet (40 mg total) by mouth daily.  . ferrous sulfate 324 MG TBEC Take 324 mg by mouth daily.  . fluticasone (FLONASE) 50 MCG/ACT nasal spray SHAKE LIQUID AND USE 1 SPRAY IN EACH NOSTRIL TWICE DAILY  . Fluticasone-Salmeterol (WIXELA INHUB) 250-50 MCG/DOSE AEPB Inhale 1 puff into the lungs 2 (two) times daily.  . Glucos-Chond-Hyal Ac-Ca Fructo (MOVE FREE JOINT HEALTH ADVANCE PO) Take 1 tablet by mouth daily.  Marland Kitchen MEGARED OMEGA-3 KRILL OIL PO Take 1 capsule by  mouth daily.  . meloxicam (MOBIC) 7.5 MG tablet Take 7.5 mg by mouth daily as needed.  Marland Kitchen omeprazole (PRILOSEC) 20 MG capsule Take 1 capsule (20 mg total) by mouth daily.  . ramipril (ALTACE) 2.5 MG capsule Take 1 capsule (2.5 mg total) by mouth daily. Dr Clayborn Bigness  . vitamin B-12 (CYANOCOBALAMIN) 1000 MCG tablet Take 1,000 mcg by mouth daily.   No facility-administered encounter medications on file as of 12/17/2020.    Follow-up: No follow-ups on file.   Montel Culver, MD

## 2020-12-17 NOTE — Assessment & Plan Note (Signed)
Injury related records and imaging reviewed, fortunately her objective findings today are very reassuring with no focal abnormalities appreciated.  I have advised her to closely monitor for any type of new symptoms inclusive of memory change, headache, visual disturbance, balance issues, etc.  If noted she is to contact us so we can discuss next appropriate clinical steps.  Steps can include formal concussion evaluation, serial imaging, advanced imaging as clinically guided.

## 2020-12-17 NOTE — Patient Instructions (Addendum)
- Can purchase and use Mederma (over the counter) and use as directed  Suture Removal, Care After This sheet gives you information about how to care for yourself after your procedure. Your health care provider may also give you more specific instructions. If you have problems or questions, contact your health care provider. What can I expect after the procedure? After your stitches (sutures) are removed, it is common to have:  Some discomfort and swelling in the area.  Slight redness in the area. Follow these instructions at home: If you have a bandage:  Wash your hands with soap and water before you change your bandage (dressing). If soap and water are not available, use hand sanitizer.  Change your dressing as told by your health care provider. If your dressing becomes wet or dirty, or develops a bad smell, change it as soon as possible.  If your dressing sticks to your skin, soak it in warm water to loosen it. Wound care  Check your wound every day for signs of infection. Check for: ? More redness, swelling, or pain. ? Fluid or blood. ? Warmth. ? Pus or a bad smell.  Wash your hands with soap and water before and after touching your wound.  Apply cream or ointment only as directed by your health care provider. If you are using cream or ointment, wash the area with soap and water 2 times a day to remove all the cream or ointment. Rinse off the soap and pat the area dry with a clean towel.  If you have skin glue or adhesive strips on your wound, leave these closures in place. They may need to stay in place for 2 weeks or longer. If adhesive strip edges start to loosen and curl up, you may trim the loose edges. Do not remove adhesive strips completely unless your health care provider tells you to do that.  Keep the wound area dry and clean. Do not take baths, swim, or use a hot tub until your health care provider approves.  Continue to protect the wound from injury.  Do not pick  at your wound. Picking can cause an infection.  When your wound has completely healed, wear sunscreen over it or cover it with clothing when you are outside. New scars get sunburned easily, which can make scarring worse.   General instructions  Take over-the-counter and prescription medicines only as told by your health care provider.  Keep all follow-up visits as told by your health care provider. This is important. Contact a health care provider if:  You have redness, swelling, or pain around your wound.  You have fluid or blood coming from your wound.  Your wound feels warm to the touch.  You have pus or a bad smell coming from your wound.  Your wound opens up. Get help right away if:  You have a fever.  You have redness that is spreading from your wound. Summary  After your sutures are removed, it is common to have some discomfort and swelling in the area.  Wash your hands with soap and water before you change your bandage (dressing).  Keep the wound area dry and clean. Do not take baths, swim, or use a hot tub until your health care provider approves. This information is not intended to replace advice given to you by your health care provider. Make sure you discuss any questions you have with your health care provider. Document Revised: 06/22/2020 Document Reviewed: 06/22/2020 Elsevier Patient Education  2021 [employer]  Inc.  

## 2020-12-17 NOTE — Assessment & Plan Note (Signed)
Patient tolerated suture and staple removal well, gentle skin cleansing cleansing performed as an adjunct to this procedure.  Post procedure care reviewed and information printed for patient's record.  She can follow-up on an as needed basis for this issue

## 2020-12-19 ENCOUNTER — Emergency Department: Payer: Medicare HMO

## 2020-12-19 ENCOUNTER — Inpatient Hospital Stay
Admission: EM | Admit: 2020-12-19 | Discharge: 2020-12-22 | DRG: 522 | Disposition: A | Discharge status: Home / Self Care | Payer: Medicare HMO | Attending: Family Medicine | Admitting: Family Medicine

## 2020-12-19 ENCOUNTER — Other Ambulatory Visit: Payer: Self-pay

## 2020-12-19 DIAGNOSIS — Z882 Allergy status to sulfonamides status: Secondary | ICD-10-CM | POA: Diagnosis not present

## 2020-12-19 DIAGNOSIS — W010XXA Fall on same level from slipping, tripping and stumbling without subsequent striking against object, initial encounter: Secondary | ICD-10-CM | POA: Diagnosis present

## 2020-12-19 DIAGNOSIS — K219 Gastro-esophageal reflux disease without esophagitis: Secondary | ICD-10-CM | POA: Diagnosis present

## 2020-12-19 DIAGNOSIS — Z20822 Contact with and (suspected) exposure to covid-19: Secondary | ICD-10-CM | POA: Diagnosis not present

## 2020-12-19 DIAGNOSIS — J9811 Atelectasis: Secondary | ICD-10-CM | POA: Diagnosis present

## 2020-12-19 DIAGNOSIS — D649 Anemia, unspecified: Secondary | ICD-10-CM | POA: Diagnosis present

## 2020-12-19 DIAGNOSIS — R0902 Hypoxemia: Secondary | ICD-10-CM | POA: Diagnosis not present

## 2020-12-19 DIAGNOSIS — E785 Hyperlipidemia, unspecified: Secondary | ICD-10-CM | POA: Diagnosis not present

## 2020-12-19 DIAGNOSIS — R9431 Abnormal electrocardiogram [ECG] [EKG]: Secondary | ICD-10-CM | POA: Diagnosis present

## 2020-12-19 DIAGNOSIS — K449 Diaphragmatic hernia without obstruction or gangrene: Secondary | ICD-10-CM | POA: Diagnosis not present

## 2020-12-19 DIAGNOSIS — Z96649 Presence of unspecified artificial hip joint: Secondary | ICD-10-CM

## 2020-12-19 DIAGNOSIS — Z79899 Other long term (current) drug therapy: Secondary | ICD-10-CM | POA: Diagnosis not present

## 2020-12-19 DIAGNOSIS — S72002A Fracture of unspecified part of neck of left femur, initial encounter for closed fracture: Principal | ICD-10-CM | POA: Diagnosis present

## 2020-12-19 DIAGNOSIS — R0689 Other abnormalities of breathing: Secondary | ICD-10-CM | POA: Diagnosis not present

## 2020-12-19 DIAGNOSIS — I10 Essential (primary) hypertension: Secondary | ICD-10-CM

## 2020-12-19 DIAGNOSIS — I130 Hypertensive heart and chronic kidney disease with heart failure and stage 1 through stage 4 chronic kidney disease, or unspecified chronic kidney disease: Secondary | ICD-10-CM | POA: Diagnosis present

## 2020-12-19 DIAGNOSIS — Z419 Encounter for procedure for purposes other than remedying health state, unspecified: Secondary | ICD-10-CM

## 2020-12-19 DIAGNOSIS — N1832 Chronic kidney disease, stage 3b: Secondary | ICD-10-CM | POA: Diagnosis present

## 2020-12-19 DIAGNOSIS — M25552 Pain in left hip: Secondary | ICD-10-CM | POA: Diagnosis present

## 2020-12-19 DIAGNOSIS — M7989 Other specified soft tissue disorders: Secondary | ICD-10-CM | POA: Diagnosis not present

## 2020-12-19 DIAGNOSIS — R609 Edema, unspecified: Secondary | ICD-10-CM | POA: Diagnosis not present

## 2020-12-19 DIAGNOSIS — Z87891 Personal history of nicotine dependence: Secondary | ICD-10-CM

## 2020-12-19 DIAGNOSIS — J449 Chronic obstructive pulmonary disease, unspecified: Secondary | ICD-10-CM | POA: Diagnosis present

## 2020-12-19 DIAGNOSIS — Z90710 Acquired absence of both cervix and uterus: Secondary | ICD-10-CM | POA: Diagnosis not present

## 2020-12-19 DIAGNOSIS — Z471 Aftercare following joint replacement surgery: Secondary | ICD-10-CM | POA: Diagnosis not present

## 2020-12-19 DIAGNOSIS — R42 Dizziness and giddiness: Secondary | ICD-10-CM | POA: Diagnosis not present

## 2020-12-19 DIAGNOSIS — I700 Atherosclerosis of aorta: Secondary | ICD-10-CM | POA: Diagnosis not present

## 2020-12-19 DIAGNOSIS — S72042A Displaced fracture of base of neck of left femur, initial encounter for closed fracture: Secondary | ICD-10-CM | POA: Diagnosis not present

## 2020-12-19 DIAGNOSIS — Z96642 Presence of left artificial hip joint: Secondary | ICD-10-CM | POA: Diagnosis not present

## 2020-12-19 DIAGNOSIS — R531 Weakness: Secondary | ICD-10-CM | POA: Diagnosis not present

## 2020-12-19 DIAGNOSIS — R296 Repeated falls: Secondary | ICD-10-CM | POA: Diagnosis present

## 2020-12-19 DIAGNOSIS — Y9301 Activity, walking, marching and hiking: Secondary | ICD-10-CM | POA: Diagnosis present

## 2020-12-19 DIAGNOSIS — R000 Tachycardia, unspecified: Secondary | ICD-10-CM | POA: Diagnosis not present

## 2020-12-19 DIAGNOSIS — I5032 Chronic diastolic (congestive) heart failure: Secondary | ICD-10-CM | POA: Diagnosis present

## 2020-12-19 DIAGNOSIS — I429 Cardiomyopathy, unspecified: Secondary | ICD-10-CM | POA: Diagnosis not present

## 2020-12-19 DIAGNOSIS — S76012A Strain of muscle, fascia and tendon of left hip, initial encounter: Secondary | ICD-10-CM | POA: Diagnosis not present

## 2020-12-19 LAB — BASIC METABOLIC PANEL
Anion gap: 10 (ref 5–15)
BUN: 18 mg/dL (ref 8–23)
CO2: 22 mmol/L (ref 22–32)
Calcium: 9.5 mg/dL (ref 8.9–10.3)
Chloride: 109 mmol/L (ref 98–111)
Creatinine, Ser: 1.32 mg/dL — ABNORMAL HIGH (ref 0.44–1.00)
GFR, Estimated: 41 mL/min — ABNORMAL LOW (ref >60)
Glucose, Bld: 135 mg/dL — ABNORMAL HIGH (ref 70–99)
Potassium: 4.1 mmol/L (ref 3.5–5.1)
Sodium: 141 mmol/L (ref 135–145)

## 2020-12-19 LAB — TROPONIN I (HIGH SENSITIVITY)
Troponin I (High Sensitivity): 6 ng/L (ref <18)
Troponin I (High Sensitivity): 6 ng/L (ref <18)

## 2020-12-19 LAB — MAGNESIUM: Magnesium: 1.8 mg/dL (ref 1.7–2.4)

## 2020-12-19 LAB — APTT: aPTT: 26 seconds (ref 24–36)

## 2020-12-19 LAB — PROTIME-INR
INR: 1 (ref 0.8–1.2)
Prothrombin Time: 13.5 seconds (ref 11.4–15.2)

## 2020-12-19 LAB — CBC
HCT: 36.1 % (ref 36.0–46.0)
Hemoglobin: 12.2 g/dL (ref 12.0–15.0)
MCH: 32.8 pg (ref 26.0–34.0)
MCHC: 33.8 g/dL (ref 30.0–36.0)
MCV: 97 fL (ref 80.0–100.0)
Platelets: 284 10*3/uL (ref 150–400)
RBC: 3.72 MIL/uL — ABNORMAL LOW (ref 3.87–5.11)
RDW: 13.1 % (ref 11.5–15.5)
WBC: 9.4 10*3/uL (ref 4.0–10.5)
nRBC: 0 % (ref 0.0–0.2)

## 2020-12-19 LAB — RESP PANEL BY RT-PCR (FLU A&B, COVID) ARPGX2
Influenza A by PCR: NEGATIVE
Influenza B by PCR: NEGATIVE
SARS Coronavirus 2 by RT PCR: NEGATIVE

## 2020-12-19 MED ORDER — SODIUM CHLORIDE 0.9 % IV BOLUS
500.0000 mL | Freq: Once | INTRAVENOUS | Status: AC
  Administered 2020-12-19: 500 mL via INTRAVENOUS

## 2020-12-19 MED ORDER — FENTANYL CITRATE (PF) 100 MCG/2ML IJ SOLN
50.0000 ug | Freq: Once | INTRAMUSCULAR | Status: AC
  Administered 2020-12-19: 50 ug via INTRAVENOUS
  Filled 2020-12-19: qty 2

## 2020-12-19 MED ORDER — CEFAZOLIN SODIUM-DEXTROSE 1-4 GM/50ML-% IV SOLN: 1.0000 g | Freq: Once | INTRAVENOUS | Status: DC

## 2020-12-19 MED ORDER — MOMETASONE FURO-FORMOTEROL FUM 200-5 MCG/ACT IN AERO
2.0000 | INHALATION_SPRAY | Freq: Two times a day (BID) | RESPIRATORY_TRACT | Status: DC
  Administered 2020-12-20 – 2020-12-22 (×5): 2 via RESPIRATORY_TRACT
  Filled 2020-12-19: qty 8.8

## 2020-12-19 MED ORDER — FLUTICASONE PROPIONATE 50 MCG/ACT NA SUSP
1.0000 | Freq: Every day | NASAL | Status: DC
  Administered 2020-12-20: 1 via NASAL
  Filled 2020-12-19: qty 16

## 2020-12-19 MED ORDER — ONDANSETRON HCL 4 MG PO TABS: 4.0000 mg | ORAL_TABLET | Freq: Four times a day (QID) | ORAL | Status: DC | PRN

## 2020-12-19 MED ORDER — RAMIPRIL 2.5 MG PO CAPS
2.5000 mg | ORAL_CAPSULE | Freq: Every day | ORAL | Status: DC
  Administered 2020-12-20 – 2020-12-21 (×2): 2.5 mg via ORAL
  Filled 2020-12-19 (×2): qty 1

## 2020-12-19 MED ORDER — ACETAMINOPHEN 650 MG RE SUPP: 650.0000 mg | Freq: Four times a day (QID) | RECTAL | Status: DC | PRN

## 2020-12-19 MED ORDER — PANTOPRAZOLE SODIUM 40 MG PO TBEC
40.0000 mg | DELAYED_RELEASE_TABLET | Freq: Every day | ORAL | Status: DC
  Administered 2020-12-20 – 2020-12-22 (×3): 40 mg via ORAL
  Filled 2020-12-19 (×3): qty 1

## 2020-12-19 MED ORDER — FERROUS SULFATE 325 (65 FE) MG PO TABS
325.0000 mg | ORAL_TABLET | Freq: Every day | ORAL | Status: DC
  Administered 2020-12-20 – 2020-12-22 (×3): 325 mg via ORAL
  Filled 2020-12-19 (×3): qty 1

## 2020-12-19 MED ORDER — SODIUM CHLORIDE 0.9 % IV SOLN: INTRAVENOUS | Status: DC

## 2020-12-19 MED ORDER — ATORVASTATIN CALCIUM 20 MG PO TABS
20.0000 mg | ORAL_TABLET | Freq: Every day | ORAL | Status: DC
  Administered 2020-12-20 – 2020-12-22 (×3): 20 mg via ORAL
  Filled 2020-12-19 (×3): qty 1

## 2020-12-19 MED ORDER — ACETAMINOPHEN 325 MG PO TABS: 650.0000 mg | ORAL_TABLET | Freq: Four times a day (QID) | ORAL | Status: DC | PRN

## 2020-12-19 MED ORDER — VITAMIN B-12 1000 MCG PO TABS
1000.0000 ug | ORAL_TABLET | Freq: Every day | ORAL | Status: DC
  Administered 2020-12-20 – 2020-12-22 (×3): 1000 ug via ORAL
  Filled 2020-12-19 (×3): qty 1

## 2020-12-19 MED ORDER — FAMOTIDINE 20 MG PO TABS
40.0000 mg | ORAL_TABLET | Freq: Every day | ORAL | Status: DC
  Administered 2020-12-20 – 2020-12-22 (×3): 40 mg via ORAL
  Filled 2020-12-19 (×3): qty 2

## 2020-12-19 MED ORDER — ONDANSETRON HCL 4 MG/2ML IJ SOLN: 4.0000 mg | Freq: Four times a day (QID) | INTRAMUSCULAR | Status: DC | PRN

## 2020-12-19 MED ORDER — CALCIUM CARBONATE-VITAMIN D 500-200 MG-UNIT PO TABS
2.0000 | ORAL_TABLET | Freq: Every day | ORAL | Status: DC
  Administered 2020-12-20 – 2020-12-22 (×3): 2 via ORAL
  Filled 2020-12-19 (×3): qty 2

## 2020-12-19 MED ORDER — LORATADINE 10 MG PO TABS
10.0000 mg | ORAL_TABLET | Freq: Every day | ORAL | Status: DC
  Administered 2020-12-20 – 2020-12-22 (×3): 10 mg via ORAL
  Filled 2020-12-19 (×3): qty 1

## 2020-12-19 MED ORDER — TRAZODONE HCL 50 MG PO TABS: 25.0000 mg | ORAL_TABLET | Freq: Every evening | ORAL | Status: DC | PRN

## 2020-12-19 MED ORDER — MAGNESIUM HYDROXIDE 400 MG/5ML PO SUSP: 30.0000 mL | Freq: Every day | ORAL | Status: DC | PRN

## 2020-12-19 MED ORDER — CARVEDILOL 3.125 MG PO TABS
3.1250 mg | ORAL_TABLET | Freq: Two times a day (BID) | ORAL | Status: DC
  Administered 2020-12-20 – 2020-12-22 (×5): 3.125 mg via ORAL
  Filled 2020-12-19: qty 1
  Filled 2020-12-19: qty 0.5
  Filled 2020-12-19 (×2): qty 1
  Filled 2020-12-19: qty 0.5
  Filled 2020-12-19: qty 1

## 2020-12-19 NOTE — ED Notes (Signed)
Pt brought in via ems from home with a fall tonight.  Pt was walking in side the house and became dizzy and fell onto the floor.  No loc.  No chest pain or sob.  Rotation noted of left hip with shortening.  No n/v/d  Pt alert  Speech clear.  Fentanyl given by ems en route.  Family in with pt.  Iv in place.  nsr on monitor.

## 2020-12-19 NOTE — ED Provider Notes (Signed)
[facility] Emergency Department Provider Note ____________________________________________   Event Date/Time   First MD Initiated Contact with Patient 12/19/20 2110     (approximate)  I have reviewed the triage vital signs and the nursing notes.   HISTORY  Chief Complaint Fall    HPI Marie Villa is a 80 y.o. female history of COPD CHF hypertension hyperlipidemia   Patient reports 2 weeks ago she had a fall with stitches across the forehead.  In her normal health today, she was walking across the kitchen when she tripped.  She fell striking her left side with immediate pain in unable to get up because of pain in her left hip.  She reports she does have a a lot of pain in the area of the left hip.  No other injury.  No injury to the right leg arms chest back head or neck.  She did not fall in the way striking her head  Husband was with her, called 911.  Received 100 mcg fentanyl with EMS for pain relief  Does report slight feeling of numbness in the left leg, mild   Past Medical History:  Diagnosis Date  . Congestive heart disease ([place])   . COPD (chronic obstructive pulmonary disease) ([place])   . GERD (gastroesophageal reflux disease)   . Hyperlipidemia   . Hypertension     Patient Active Problem List   Diagnosis Date Noted  . Head trauma 12/17/2020  . Visit for suture removal 12/17/2020  . Primary insomnia 10/22/2020  . Left knee pain 11/25/2018  . Primary osteoarthritis of left knee 11/25/2018  . Essential hypertension 10/12/2017  . COPD (chronic obstructive pulmonary disease) ([place]) 04/10/2017  . Simple chronic bronchitis ([place]) 06/09/2016  . Other seasonal allergic rhinitis 06/09/2016  . Cardiomyopathy ([place]) 06/06/2015  . Chronic kidney disease, stage 3b ([place]) 06/06/2015  . CCF (congestive cardiac failure) ([place]) 06/06/2015  . 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[place]) 06/06/2015  . Gastroesophageal reflux disease 06/06/2015  .  Hypercholesteremia 06/06/2015  . Cardiac murmur 12/16/2013  . SOB (shortness of breath) 12/16/2013    Past Surgical History:  Procedure Laterality Date  . CATARACT EXTRACTION, BILATERAL    . COLONOSCOPY  2012   normal- cleared for 10- Iftikhar  . VAGINAL HYSTERECTOMY      Prior to Admission medications   Medication Sig Start Date End Date Taking? Authorizing Provider  acetaminophen (TYLENOL) 325 MG tablet Take 325 mg by mouth as needed.    [provider]  amitriptyline (ELAVIL) 10 MG tablet Take 1 tablet (10 mg total) by mouth at bedtime. 10/22/20   Juline Patch, MD  atorvastatin (LIPITOR) 20 MG tablet Take 1 tablet (20 mg total) by mouth daily. 10/22/20   Juline Patch, MD  Calcium Carb-Cholecalciferol (CALCIUM-VITAMIN D) 600-400 MG-UNIT TABS Take 2 tablets by mouth daily.    [provider]  carvedilol (COREG) 3.125 MG tablet Take 1 tablet (3.125 mg total) by mouth 2 (two) times daily. [place] 10/22/20   Juline Patch, MD  cetirizine (ZYRTEC) 10 MG tablet TAKE 1 TABLET(10 MG) BY MOUTH DAILY 11/13/20   Juline Patch, MD  famotidine (PEPCID) 40 MG tablet Take 1 tablet (40 mg total) by mouth daily. 10/22/20   Juline Patch, MD  ferrous sulfate 324 MG TBEC Take 324 mg by mouth daily.    [provider]  fluticasone (FLONASE) 50 MCG/ACT nasal spray SHAKE LIQUID AND USE 1 SPRAY IN EACH NOSTRIL TWICE DAILY 10/22/20  Juline Patch, MD  Fluticasone-Salmeterol [facility] INHUB) 250-50 MCG/DOSE AEPB Inhale 1 puff into the lungs 2 (two) times daily. 11/22/20   Juline Patch, MD  Glucos-Chond-Hyal Ac-Ca Fructo (MOVE FREE JOINT HEALTH ADVANCE PO) Take 1 tablet by mouth daily.    [provider]  MEGARED OMEGA-3 KRILL OIL PO Take 1 capsule by mouth daily.    [provider]  meloxicam (MOBIC) 7.5 MG tablet Take 7.5 mg by mouth daily as needed. 05/24/20   [provider]  omeprazole (PRILOSEC) 20 MG capsule Take 1 capsule (20 mg total) by  mouth daily. 10/22/20   Juline Patch, MD  ramipril (ALTACE) 2.5 MG capsule Take 1 capsule (2.5 mg total) by mouth daily. Dr Clayborn Bigness 11/26/18   Juline Patch, MD  vitamin B-12 (CYANOCOBALAMIN) 1000 MCG tablet Take 1,000 mcg by mouth daily.    [provider]    Allergies Sulfa antibiotics  Family History  Problem Relation Age of Onset  . Breast cancer Neg Hx     Social History Social History   Tobacco Use  . Smoking status: Former Smoker    Types: Cigarettes  . Smokeless tobacco: Never Used  . Tobacco comment: quit over 50 years  Vaping Use  . Vaping Use: Never used  Substance Use Topics  . Alcohol use: Yes    Alcohol/week: 1.0 standard drink    Types: 1 Cans of beer per week  . Drug use: Never    Review of Systems Constitutional: No fever/chills only recent illness was a fall 2 weeks ago Eyes: No visual changes. ENT: No neck pain cardiovascular: Denies chest pain. Respiratory: Denies shortness of breath. Gastrointestinal: No abdominal pain.   Musculoskeletal: Negative for back pain.  See HPI Skin: Negative for rash. Neurological: Negative for headaches, areas of focal weakness or numbness of slight numbness in the left lower foot.    ____________________________________________   PHYSICAL EXAM:  VITAL SIGNS: ED Triage Vitals  Enc Vitals Group     BP 12/19/20 2012 130/76     Pulse Rate 12/19/20 2012 93     Resp 12/19/20 2012 18     Temp 12/19/20 2012 97.8 F (36.6 C)     Temp Source 12/19/20 2012 Oral     SpO2 12/19/20 2010 95 %     Weight 12/19/20 2013 137 lb 12.6 oz (62.5 kg)     Height 12/19/20 2013 '5\' 5"'$  (1.651 m)     Head Circumference --      Peak Flow --      Pain Score 12/19/20 2010 4     Pain Loc --      Pain Edu? --      Excl. in [place]? --     Constitutional: Alert and oriented. Well appearing and in no acute distress.  Does appear to be in some pain however reports pain in left hip region. Eyes: Conjunctivae are normal. Head:  Atraumatic. Nose: No congestion/rhinnorhea. Mouth/Throat: Mucous membranes are moist. Neck: No stridor.  Cardiovascular: Normal rate, regular rhythm. Grossly normal heart sounds.  Good peripheral circulation. Respiratory: Normal respiratory effort.  No retractions. Lungs CTAB. Gastrointestinal: Soft and nontender. No distention. Musculoskeletal:   RIGHT Right upper extremity demonstrates normal strength, good use of all muscles. No edema bruising or contusions of the right shoulder/upper arm, right elbow, right forearm / hand. Full range of motion of the right right upper extremity without pain. No evidence of trauma. Strong radial pulse. Intact median/ulnar/radial neuro-muscular exam.  LEFT Left upper extremity demonstrates normal strength, good use of all muscles. No edema bruising or contusions of the left shoulder/upper arm, left elbow, left forearm / hand. Full range of motion of the left  upper extremity without pain. No evidence of trauma. Strong radial pulse. Intact median/ulnar/radial neuro-muscular exam.  Lower Extremities  No edema. Normal DP/PT pulses bilateral with good cap refill.  Normal neuro-motor function lower extremities bilateral.  RIGHT Right lower extremity demonstrates normal strength, good use of all muscles. No edema bruising or contusions of the right hip, right knee, right ankle. Full range of motion of the right lower extremity without pain. No pain on axial loading. No evidence of trauma.  LEFT Left lower extremity with obvious shortening and some external rotation.  Strong dorsalis pedis and posterior tibial pulse with normal cap refill toe wiggle and sensation across the left foot and leg.  She does report a slight feeling of numbness, but no objective findings of loss of sensation.  Denies pain to palpation of the foot ankle tib-fib knee distal femur, but reports obvious pain with palpation over the left lateral hip/trochanteric region.  No hematoma or skin  breakdown in the area.  No lacerations.  Neurologic:  Normal speech and language. No gross focal neurologic deficits are appreciated.  Skin:  Skin is warm, dry and intact. No rash noted. Psychiatric: Mood and affect are normal. Speech and behavior are normal.  ____________________________________________   LABS (all labs ordered are listed, but only abnormal results are displayed)  Labs Reviewed  BASIC METABOLIC PANEL - Abnormal; Notable for the following components:      Result Value   Glucose, Bld 135 (*)    Creatinine, Ser 1.32 (*)    GFR, Estimated 41 (*)    All other components within normal limits  CBC - Abnormal; Notable for the following components:   RBC 3.72 (*)    All other components within normal limits  RESP PANEL BY RT-PCR (FLU A&B, COVID) ARPGX2  MAGNESIUM  TROPONIN I (HIGH SENSITIVITY)  TROPONIN I (HIGH SENSITIVITY)   ____________________________________________  EKG  Reviewed inter by me at 2022 Heart rate 99 QRS 100 QTc 510 Normal sinus rhythm, occasional sinus arrhythmia.  No evidence of acute ischemia, minimal prolongation of QT interval ____________________________________________  RADIOLOGY  DG Chest 1 View  Result Date: 12/19/2020 CLINICAL DATA:  Status post fall. EXAM: CHEST  1 VIEW COMPARISON:  September 14, 2016 FINDINGS: Mild linear scarring and/or atelectasis is seen along the medial aspect of the left lung base. There is no evidence of a pleural effusion or pneumothorax. The cardiac silhouette is markedly enlarged. There is moderate severity calcification of the aortic arch. There is a large hiatal hernia. The visualized skeletal structures are unremarkable. IMPRESSION: 1. Cardiomegaly with mild left basilar linear scarring and/or atelectasis. 2. Large hiatal hernia. Electronically Signed   By: Virgina Norfolk M.D.   On: 12/19/2020 22:05   DG Hip Unilat W or Wo Pelvis 2-3 Views Left  Result Date: 12/19/2020 CLINICAL DATA:  Status post fall.  EXAM: DG HIP (WITH OR WITHOUT PELVIS) 2-3V LEFT COMPARISON:  None. FINDINGS: Acute fracture deformity is seen extending through the neck of the proximal left femur. Approximately 1 shaft width superolateral displacement of the distal fracture site is noted. There is no evidence of dislocation. Mild soft tissue swelling is noted along the lateral aspect of the left hip. IMPRESSION: Acute fracture of the proximal left femur. Electronically Signed   By: Virgina Norfolk  M.D.   On: 12/19/2020 22:03   Imaging reviewed, discussed with Dr. Posey Pronto left hip.  Left femur neck fracture.  Chest x-ray cardiomegaly.  Large hiatal hernia ____________________________________________   PROCEDURES  Procedure(s) performed: None  Procedures  Critical Care performed: No  ____________________________________________   INITIAL IMPRESSION / ASSESSMENT AND PLAN / ED COURSE  Pertinent labs & imaging results that were available during my care of the patient were reviewed by me and considered in my medical decision making (see chart for details).   Patient presents after a fall, appears to be likely mechanical in nature based on the history given.  Patient with obvious deformity shortening and rotation of the left hip.  Reports a slight paresthesia but objective testing does not show sensory or neurologic or vascular deficit.  Pain well controlled after fentanyl.  Imaging positive for hip fracture.  Remaining labs are reviewed And generally unremarkable for evidence of acute medical etiology of fall.  Clinical Course as of 12/20/20 0829  Wed Dec 19, 2020  2238 Discussed case with Dr. Posey Pronto of orthopedics.  Will see in consult on patient in the morning.  He reports likely go to the OR tomorrow.  [MQ]    Clinical Course User Index [MQ] Delman Kitten, MD    Admission discussed with the hospitalist service will admit the patient, orthopedics consulting.  Patient and husband agreeable with plan, reports her pain is  well controlled at time of admission except if she is to go move the leg care.  Appears stable for admission ____________________________________________   FINAL CLINICAL IMPRESSION(S) / ED DIAGNOSES  Final diagnoses:  Closed fracture of left hip, initial encounter [facility])        Note:  This document was prepared using Dragon voice recognition software and may include unintentional dictation errors       Delman Kitten, MD 12/20/20 [phone]

## 2020-12-19 NOTE — ED Notes (Signed)
Pain meds given  Family with pt  Iv fluids infusing  nsr on monitor.

## 2020-12-19 NOTE — ED Notes (Signed)
Covid swab and a rainbow sent to the lab at this time.

## 2020-12-19 NOTE — H&P (Signed)
[place]   PATIENT NAME: Marie Villa    MR#:  [id number]  DATE OF BIRTH:  1941/02/25  DATE OF ADMISSION:  12/19/2020  PRIMARY CARE PHYSICIAN: Juline Patch, MD   Patient is coming from: Home.  REQUESTING/REFERRING PHYSICIAN: Delman Kitten, MD  CHIEF COMPLAINT:   Chief Complaint  Patient presents with  . Fall    HISTORY OF PRESENT ILLNESS:  Marie Villa is a 80 y.o. Caucasian female with medical history significant for CHF, COPD, GERD, hypertension and dyslipidemia, who presented to the emergency room with acute onset of left hip pain after having a mechanical accidental fall on her left side tripping in her kitchen.  She denied any presyncope or syncope.  No paresthesias or focal muscle weakness.  No chest pain or palpitations.  No nausea or vomiting or abdominal pain.  No fever or chills.  She denies any dysuria, oliguria or hematuria or flank pain. ED Course: When the patient came to the ER respiratory it was 35 and later 25 with otherwise normal vital signs.  Labs revealed a creatinine 1.32 with otherwise unremarkable BMP.  Magnesium was 1.8.  High-sensitivity troponin I was 6 twice.  CBC was unremarkable.  Influenza antigens and COVID-19 PCR came back negative.  EKG as reviewed by me : Showed normal sinus rhythm with a rate of 90 with left axis deviation and borderline low voltage QRS with prolonged QT interval with QTC of 513 MS. Imaging: Portable chest ray showed cardiomegaly with mild left basal linear atelectasis or scarring.  It showed moderate hiatal hernia.  Hip x-ray showed acute fracture of the left femoral neck.  The patient was given 1 g of IV Ancef, 50 mcg of IV fentanyl and 500 mL IV normal saline bolus.  She will be admitted to a med-surgical bed for further evaluation and management. PAST MEDICAL HISTORY:   Past Medical History:  Diagnosis Date  . Congestive heart disease ([place])   . COPD (chronic obstructive pulmonary disease) ([place])   . GERD  (gastroesophageal reflux disease)   . Hyperlipidemia   . Hypertension     PAST SURGICAL HISTORY:   Past Surgical History:  Procedure Laterality Date  . CATARACT EXTRACTION, BILATERAL    . COLONOSCOPY  2012   normal- cleared for 10- Iftikhar  . VAGINAL HYSTERECTOMY      SOCIAL HISTORY:   Social History   Tobacco Use  . Smoking status: Former Smoker    Types: Cigarettes  . Smokeless tobacco: Never Used  . Tobacco comment: quit over 50 years  Substance Use Topics  . Alcohol use: Yes    Alcohol/week: 1.0 standard drink    Types: 1 Cans of beer per week    FAMILY HISTORY:   Family History  Problem Relation Age of Onset  . Breast cancer Neg Hx     DRUG ALLERGIES:   Allergies  Allergen Reactions  . Sulfa Antibiotics Hives    REVIEW OF SYSTEMS:   ROS As per history of present illness. All pertinent systems were reviewed above. Constitutional, HEENT, cardiovascular, respiratory, GI, GU, musculoskeletal, neuro, psychiatric, endocrine, integumentary and hematologic systems were reviewed and are otherwise negative/unremarkable except for positive findings mentioned above in the HPI.   MEDICATIONS AT HOME:   Prior to Admission medications   Medication Sig Start Date End Date Taking? Authorizing Provider  acetaminophen (TYLENOL) 325 MG tablet Take 325 mg by mouth as needed.    [provider]  amitriptyline (ELAVIL) 10  MG tablet Take 1 tablet (10 mg total) by mouth at bedtime. 10/22/20   Juline Patch, MD  atorvastatin (LIPITOR) 20 MG tablet Take 1 tablet (20 mg total) by mouth daily. 10/22/20   Juline Patch, MD  Calcium Carb-Cholecalciferol (CALCIUM-VITAMIN D) 600-400 MG-UNIT TABS Take 2 tablets by mouth daily.    [provider]  carvedilol (COREG) 3.125 MG tablet Take 1 tablet (3.125 mg total) by mouth 2 (two) times daily. [place] 10/22/20   Juline Patch, MD  cetirizine (ZYRTEC) 10 MG tablet TAKE 1 TABLET(10 MG) BY MOUTH DAILY 11/13/20   Juline Patch, MD  famotidine (PEPCID) 40 MG tablet Take 1 tablet (40 mg total) by mouth daily. 10/22/20   Juline Patch, MD  ferrous sulfate 324 MG TBEC Take 324 mg by mouth daily.    [provider]  fluticasone (FLONASE) 50 MCG/ACT nasal spray SHAKE LIQUID AND USE 1 SPRAY IN EACH NOSTRIL TWICE DAILY 10/22/20   Juline Patch, MD  Fluticasone-Salmeterol [facility] INHUB) 250-50 MCG/DOSE AEPB Inhale 1 puff into the lungs 2 (two) times daily. 11/22/20   Juline Patch, MD  Glucos-Chond-Hyal Ac-Ca Fructo (MOVE FREE JOINT HEALTH ADVANCE PO) Take 1 tablet by mouth daily.    [provider]  MEGARED OMEGA-3 KRILL OIL PO Take 1 capsule by mouth daily.    [provider]  meloxicam (MOBIC) 7.5 MG tablet Take 7.5 mg by mouth daily as needed. 05/24/20   [provider]  omeprazole (PRILOSEC) 20 MG capsule Take 1 capsule (20 mg total) by mouth daily. 10/22/20   Juline Patch, MD  ramipril (ALTACE) 2.5 MG capsule Take 1 capsule (2.5 mg total) by mouth daily. Dr Clayborn Bigness 11/26/18   Juline Patch, MD  vitamin B-12 (CYANOCOBALAMIN) 1000 MCG tablet Take 1,000 mcg by mouth daily.    [provider]      VITAL SIGNS:  Blood pressure 124/69, pulse 99, temperature 97.8 F (36.6 C), temperature source Oral, resp. rate (!) 27, height '5\' 5"'$  (1.651 m), weight 62.5 kg, SpO2 92 %.  PHYSICAL EXAMINATION:  Physical Exam  GENERAL:  80 y.o.-year-old Caucasian female patient lying in the bed with no acute distress.  EYES: Pupils equal, round, reactive to light and accommodation. No scleral icterus. Extraocular muscles intact.  HEENT: Head atraumatic, normocephalic. Oropharynx and nasopharynx clear.  NECK:  Supple, no jugular venous distention. No thyroid enlargement, no tenderness.  LUNGS: Normal breath sounds bilaterally, no wheezing, rales,rhonchi or crepitation. No use of accessory muscles of respiration.  CARDIOVASCULAR: Regular rate and rhythm, S1, S2 normal. No murmurs,  rubs, or gallops.  ABDOMEN: Soft, nondistended, nontender. Bowel sounds present. No organomegaly or mass.  EXTREMITIES: No pedal edema, cyanosis, or clubbing.  NEUROLOGIC: Cranial nerves II through XII are intact. Muscle strength 5/5 in all extremities. Sensation intact. Gait not checked. Musculoskeletal: Left lateral hip tenderness PSYCHIATRIC: The patient is alert and oriented x 3.  Normal affect and good eye contact. SKIN: No obvious rash, lesion, or ulcer.   LABORATORY PANEL:   CBC Recent Labs  Lab 12/19/20 2015  WBC 9.4  HGB 12.2  HCT 36.1  PLT 284   ------------------------------------------------------------------------------------------------------------------  Chemistries  Recent Labs  Lab 12/19/20 2015  NA 141  K 4.1  CL 109  CO2 22  GLUCOSE 135*  BUN 18  CREATININE 1.32*  CALCIUM 9.5   ------------------------------------------------------------------------------------------------------------------  Cardiac Enzymes No results for input(s): TROPONINI in the last 168 hours. ------------------------------------------------------------------------------------------------------------------  RADIOLOGY:  DG Chest 1 View  Result Date: 12/19/2020 CLINICAL DATA:  Status post fall. EXAM: CHEST  1 VIEW COMPARISON:  September 14, 2016 FINDINGS: Mild linear scarring and/or atelectasis is seen along the medial aspect of the left lung base. There is no evidence of a pleural effusion or pneumothorax. The cardiac silhouette is markedly enlarged. There is moderate severity calcification of the aortic arch. There is a large hiatal hernia. The visualized skeletal structures are unremarkable. IMPRESSION: 1. Cardiomegaly with mild left basilar linear scarring and/or atelectasis. 2. Large hiatal hernia. Electronically Signed   By: Virgina Norfolk M.D.   On: 12/19/2020 22:05   DG Hip Unilat W or Wo Pelvis 2-3 Views Left  Result Date: 12/19/2020 CLINICAL DATA:  Status post fall. EXAM:  DG HIP (WITH OR WITHOUT PELVIS) 2-3V LEFT COMPARISON:  None. FINDINGS: Acute fracture deformity is seen extending through the neck of the proximal left femur. Approximately 1 shaft width superolateral displacement of the distal fracture site is noted. There is no evidence of dislocation. Mild soft tissue swelling is noted along the lateral aspect of the left hip. IMPRESSION: Acute fracture of the proximal left femur. Electronically Signed   By: Virgina Norfolk M.D.   On: 12/19/2020 22:03      IMPRESSION AND PLAN:  Active Problems:   Closed left hip fracture (HCC)  1.  Closed left femoral neck/hip fracture secondary to mechanical fall. -The patient will be admitted to a med-surgical bed. -Pain management will be provided. -She will be kept n.p.o. after midnight. -Orthopedic consultation will be obtained. -Dr. Posey Pronto was notified about patient. -The patient is a history of CHF with no history of coronary artery disease, renal failure, diabetes mellitus on insulin or CVA.  She is considered slightly above average risk for age for protection cardiovascular events.  She has COPD with no current active pulmonary issues.  2.  Essential hypertension. -We will continue Coreg which should offer perioperative cardiovascular protection.  We will also continue ramipril.  3.  Dyslipidemia. -We will continue statin therapy that should offer perioperative cardiovascular protection.  4.  COPD without exacerbation. -We will continue her Wixela inhaler.  5.  GERD. -PPI therapy will be resumed.  DVT prophylaxis: SCDs.  Medical prophylaxis is postponed till postoperative period. Code Status: full code.  This was discussed with the patient and her husband. Family Communication:  The plan of care was discussed in details with the patient (and her husband). I answered all questions. The patient agreed to proceed with the above mentioned plan. Further management will depend upon hospital course. Disposition  Plan: Back to previous home environment Consults called: Orthopedic consultation. All the records are reviewed and case discussed with ED provider.  Status is: Inpatient  Remains inpatient appropriate because:Ongoing active pain requiring inpatient pain management, Ongoing diagnostic testing needed not appropriate for outpatient work up, Unsafe d/c plan, IV treatments appropriate due to intensity of illness or inability to take PO and Inpatient level of care appropriate due to severity of illness   Dispo: The patient is from: Home              Anticipated d/c is to: SNF              Patient currently is not medically stable to d/c.   Difficult to place patient No   TOTAL TIME TAKING CARE OF THIS PATIENT: 55 minutes.    Christel Mormon M.D on 12/19/2020 at 11:05 PM  [facility]   From 7  PM-7 AM, contact night-coverage [email or web address]  CC: Primary care physician; Juline Patch, MD

## 2020-12-19 NOTE — Progress Notes (Signed)
Full consult note and discussion with patient to follow tomorrow AM.  Called by ED staff. Imaging reviewed.  - Plan for surgery tomorrow, likely late morning/early afternoon for L hip hemiarthroplasty - NPO after midnight - Hold anticoagulation - Admit to Hospitalist team.

## 2020-12-19 NOTE — ED Triage Notes (Signed)
EMS brought in from home for fall when ambulating to kitchen. Dizziness. Denies head injury/blood thinner use. Obvious shortening/external rotation of LLE. AAOx4 upon arrival.   EMS gave Fentanyl 163mg IVP and Zofran '4mg'$  IVP pta.

## 2020-12-20 ENCOUNTER — Inpatient Hospital Stay: Payer: Medicare HMO

## 2020-12-20 ENCOUNTER — Inpatient Hospital Stay: Payer: Medicare HMO | Admitting: Anesthesiology

## 2020-12-20 ENCOUNTER — Telehealth: Payer: Self-pay | Admitting: Family Medicine

## 2020-12-20 ENCOUNTER — Telehealth: Payer: Self-pay

## 2020-12-20 ENCOUNTER — Encounter
Admission: EM | Disposition: A | Discharge status: Home / Self Care | Payer: Self-pay | Source: Home / Self Care | Attending: Family Medicine

## 2020-12-20 ENCOUNTER — Encounter: Payer: Self-pay | Admitting: Family Medicine

## 2020-12-20 DIAGNOSIS — S72002A Fracture of unspecified part of neck of left femur, initial encounter for closed fracture: Secondary | ICD-10-CM | POA: Diagnosis not present

## 2020-12-20 HISTORY — PX: HIP ARTHROPLASTY: SHX981

## 2020-12-20 LAB — CBC
HCT: 34.2 % — ABNORMAL LOW (ref 36.0–46.0)
Hemoglobin: 11.5 g/dL — ABNORMAL LOW (ref 12.0–15.0)
MCH: 32.6 pg (ref 26.0–34.0)
MCHC: 33.6 g/dL (ref 30.0–36.0)
MCV: 96.9 fL (ref 80.0–100.0)
Platelets: 233 10*3/uL (ref 150–400)
RBC: 3.53 MIL/uL — ABNORMAL LOW (ref 3.87–5.11)
RDW: 12.8 % (ref 11.5–15.5)
WBC: 9 10*3/uL (ref 4.0–10.5)
nRBC: 0 % (ref 0.0–0.2)

## 2020-12-20 LAB — BASIC METABOLIC PANEL
Anion gap: 10 (ref 5–15)
BUN: 16 mg/dL (ref 8–23)
CO2: 22 mmol/L (ref 22–32)
Calcium: 8.6 mg/dL — ABNORMAL LOW (ref 8.9–10.3)
Chloride: 109 mmol/L (ref 98–111)
Creatinine, Ser: 1.09 mg/dL — ABNORMAL HIGH (ref 0.44–1.00)
GFR, Estimated: 51 mL/min — ABNORMAL LOW (ref >60)
Glucose, Bld: 125 mg/dL — ABNORMAL HIGH (ref 70–99)
Potassium: 3.9 mmol/L (ref 3.5–5.1)
Sodium: 141 mmol/L (ref 135–145)

## 2020-12-20 LAB — MRSA PCR SCREENING: MRSA by PCR: NEGATIVE

## 2020-12-20 SURGERY — HEMIARTHROPLASTY, HIP, DIRECT ANTERIOR APPROACH, FOR FRACTURE
Anesthesia: Monitor Anesthesia Care | Site: Hip | Laterality: Left

## 2020-12-20 MED ORDER — METOCLOPRAMIDE HCL 10 MG PO TABS: 5.0000 mg | ORAL_TABLET | Freq: Three times a day (TID) | ORAL | Status: DC | PRN

## 2020-12-20 MED ORDER — CEFAZOLIN SODIUM-DEXTROSE 2-3 GM-%(50ML) IV SOLR
INTRAVENOUS | Status: DC | PRN
  Administered 2020-12-20: 2 g via INTRAVENOUS

## 2020-12-20 MED ORDER — PROPOFOL 500 MG/50ML IV EMUL
INTRAVENOUS | Status: DC | PRN
  Administered 2020-12-20: 50 ug/kg/min via INTRAVENOUS

## 2020-12-20 MED ORDER — HYDROCODONE-ACETAMINOPHEN 7.5-325 MG PO TABS: 1.0000 | ORAL_TABLET | Freq: Once | ORAL | Status: DC | PRN

## 2020-12-20 MED ORDER — SENNOSIDES-DOCUSATE SODIUM 8.6-50 MG PO TABS
1.0000 | ORAL_TABLET | Freq: Every evening | ORAL | Status: DC | PRN
  Filled 2020-12-20: qty 1

## 2020-12-20 MED ORDER — BUPIVACAINE LIPOSOME 1.3 % IJ SUSP
INTRAMUSCULAR | Status: DC | PRN
  Administered 2020-12-20: 50 mL

## 2020-12-20 MED ORDER — MORPHINE SULFATE (PF) 2 MG/ML IV SOLN
2.0000 mg | INTRAVENOUS | Status: DC | PRN
  Administered 2020-12-20: 2 mg via INTRAVENOUS
  Filled 2020-12-20: qty 1

## 2020-12-20 MED ORDER — EPHEDRINE SULFATE 50 MG/ML IJ SOLN
INTRAMUSCULAR | Status: DC | PRN
  Administered 2020-12-20: 10 mg via INTRAVENOUS
  Administered 2020-12-20: 5 mg via INTRAVENOUS

## 2020-12-20 MED ORDER — ACETAMINOPHEN 500 MG PO TABS
1000.0000 mg | ORAL_TABLET | Freq: Three times a day (TID) | ORAL | Status: AC
  Administered 2020-12-20 – 2020-12-21 (×4): 1000 mg via ORAL
  Filled 2020-12-20 (×4): qty 2

## 2020-12-20 MED ORDER — BUPIVACAINE HCL (PF) 0.5 % IJ SOLN
INTRAMUSCULAR | Status: DC | PRN
  Administered 2020-12-20: 3 mL

## 2020-12-20 MED ORDER — MENTHOL 3 MG MT LOZG
1.0000 | LOZENGE | OROMUCOSAL | Status: DC | PRN
  Filled 2020-12-20: qty 9

## 2020-12-20 MED ORDER — ONDANSETRON HCL 4 MG/2ML IJ SOLN: 4.0000 mg | Freq: Four times a day (QID) | INTRAMUSCULAR | Status: DC | PRN

## 2020-12-20 MED ORDER — ONDANSETRON HCL 4 MG PO TABS: 4.0000 mg | ORAL_TABLET | Freq: Four times a day (QID) | ORAL | Status: DC | PRN

## 2020-12-20 MED ORDER — ACETAMINOPHEN 10 MG/ML IV SOLN
INTRAVENOUS | Status: DC | PRN
  Administered 2020-12-20: 1000 mg via INTRAVENOUS

## 2020-12-20 MED ORDER — OXYCODONE HCL 5 MG PO TABS: 2.5000 mg | ORAL_TABLET | ORAL | Status: DC | PRN

## 2020-12-20 MED ORDER — FENTANYL CITRATE (PF) 100 MCG/2ML IJ SOLN: 25.0000 ug | INTRAMUSCULAR | Status: DC | PRN

## 2020-12-20 MED ORDER — DOCUSATE SODIUM 100 MG PO CAPS
100.0000 mg | ORAL_CAPSULE | Freq: Two times a day (BID) | ORAL | Status: DC
  Administered 2020-12-20 – 2020-12-21 (×3): 100 mg via ORAL
  Filled 2020-12-20 (×5): qty 1

## 2020-12-20 MED ORDER — SODIUM CHLORIDE 0.9 % IV SOLN
INTRAVENOUS | Status: DC | PRN
  Administered 2020-12-20: 50 ug/min via INTRAVENOUS

## 2020-12-20 MED ORDER — OXYCODONE HCL 5 MG PO TABS
5.0000 mg | ORAL_TABLET | ORAL | Status: DC | PRN
  Administered 2020-12-21 – 2020-12-22 (×2): 10 mg via ORAL
  Filled 2020-12-20 (×2): qty 2

## 2020-12-20 MED ORDER — OXYCODONE-ACETAMINOPHEN 5-325 MG PO TABS: 1.0000 | ORAL_TABLET | ORAL | Status: DC | PRN

## 2020-12-20 MED ORDER — ACETAMINOPHEN 160 MG/5ML PO SOLN
325.0000 mg | ORAL | Status: DC | PRN
  Filled 2020-12-20: qty 20.3

## 2020-12-20 MED ORDER — ONDANSETRON HCL 4 MG/2ML IJ SOLN: 4.0000 mg | Freq: Once | INTRAMUSCULAR | Status: DC | PRN

## 2020-12-20 MED ORDER — TRANEXAMIC ACID-NACL 1000-0.7 MG/100ML-% IV SOLN
1000.0000 mg | Freq: Once | INTRAVENOUS | Status: AC
  Administered 2020-12-20: 1000 mg via INTRAVENOUS

## 2020-12-20 MED ORDER — TRANEXAMIC ACID-NACL 1000-0.7 MG/100ML-% IV SOLN
INTRAVENOUS | Status: DC | PRN
  Administered 2020-12-20: 1000 mg via INTRAVENOUS

## 2020-12-20 MED ORDER — KETOROLAC TROMETHAMINE 15 MG/ML IJ SOLN
7.5000 mg | Freq: Four times a day (QID) | INTRAMUSCULAR | Status: AC
  Administered 2020-12-20 – 2020-12-21 (×4): 7.5 mg via INTRAVENOUS
  Filled 2020-12-20 (×4): qty 1

## 2020-12-20 MED ORDER — ENOXAPARIN SODIUM 40 MG/0.4ML ~~LOC~~ SOLN
40.0000 mg | SUBCUTANEOUS | Status: DC
  Administered 2020-12-21 – 2020-12-22 (×2): 40 mg via SUBCUTANEOUS
  Filled 2020-12-20 (×2): qty 0.4

## 2020-12-20 MED ORDER — PROPOFOL 10 MG/ML IV BOLUS
INTRAVENOUS | Status: DC | PRN
  Administered 2020-12-20: 30 mg via INTRAVENOUS

## 2020-12-20 MED ORDER — METOCLOPRAMIDE HCL 5 MG/ML IJ SOLN
5.0000 mg | Freq: Three times a day (TID) | INTRAMUSCULAR | Status: DC | PRN
Start: 2020-12-20 — End: 2020-12-20

## 2020-12-20 MED ORDER — TRANEXAMIC ACID-NACL 1000-0.7 MG/100ML-% IV SOLN
INTRAVENOUS | Status: AC
  Filled 2020-12-20: qty 100

## 2020-12-20 MED ORDER — SODIUM CHLORIDE 0.9 % IV SOLN: INTRAVENOUS | Status: DC

## 2020-12-20 MED ORDER — TRAMADOL HCL 50 MG PO TABS: 50.0000 mg | ORAL_TABLET | Freq: Four times a day (QID) | ORAL | Status: DC | PRN

## 2020-12-20 MED ORDER — FENTANYL CITRATE (PF) 100 MCG/2ML IJ SOLN
INTRAMUSCULAR | Status: AC
  Filled 2020-12-20: qty 2

## 2020-12-20 MED ORDER — FLEET ENEMA 7-19 GM/118ML RE ENEM: 1.0000 | ENEMA | Freq: Once | RECTAL | Status: DC | PRN

## 2020-12-20 MED ORDER — SODIUM CHLORIDE 0.9 % IR SOLN
Status: DC | PRN
  Administered 2020-12-20: 1000 mL

## 2020-12-20 MED ORDER — ACETAMINOPHEN 10 MG/ML IV SOLN
INTRAVENOUS | Status: AC
  Filled 2020-12-20: qty 100

## 2020-12-20 MED ORDER — FENTANYL CITRATE (PF) 100 MCG/2ML IJ SOLN
INTRAMUSCULAR | Status: DC | PRN
  Administered 2020-12-20: 50 ug via INTRAVENOUS

## 2020-12-20 MED ORDER — BISACODYL 10 MG RE SUPP
10.0000 mg | Freq: Every day | RECTAL | Status: DC | PRN
  Filled 2020-12-20: qty 1

## 2020-12-20 MED ORDER — ACETAMINOPHEN 325 MG PO TABS: 325.0000 mg | ORAL_TABLET | ORAL | Status: DC | PRN

## 2020-12-20 MED ORDER — CEFAZOLIN SODIUM-DEXTROSE 1-4 GM/50ML-% IV SOLN
1.0000 g | Freq: Four times a day (QID) | INTRAVENOUS | Status: AC
  Administered 2020-12-20 – 2020-12-21 (×2): 1 g via INTRAVENOUS
  Filled 2020-12-20 (×2): qty 50

## 2020-12-20 MED ORDER — HYDROMORPHONE HCL 1 MG/ML IJ SOLN: 0.2000 mg | INTRAMUSCULAR | Status: DC | PRN

## 2020-12-20 MED ORDER — PHENYLEPHRINE HCL (PRESSORS) 10 MG/ML IV SOLN
INTRAVENOUS | Status: DC | PRN
  Administered 2020-12-20 (×3): 100 ug via INTRAVENOUS

## 2020-12-20 MED ORDER — PROPOFOL 1000 MG/100ML IV EMUL
INTRAVENOUS | Status: AC
  Filled 2020-12-20: qty 100

## 2020-12-20 MED ORDER — PHENOL 1.4 % MT LIQD
1.0000 | OROMUCOSAL | Status: DC | PRN
  Filled 2020-12-20: qty 177

## 2020-12-20 SURGICAL SUPPLY — 73 items
ANCHOR 2.3 SP SGL 1.2 XBRAID (Anchor) ×2 IMPLANT
ANCHOR SUT BIO SW 4.75X19.1 (Anchor) ×3 IMPLANT
BLADE SAW SGTL 13X75X1.27 (BLADE) ×2 IMPLANT
BLADE SURG SZ10 CARB STEEL (BLADE) ×2 IMPLANT
BNDG COHESIVE 4X5 TAN STRL (GAUZE/BANDAGES/DRESSINGS) ×2 IMPLANT
CATH FOLEY SIL 2WAY 14FR5CC (CATHETERS) ×1 IMPLANT
CHLORAPREP W/TINT 26 (MISCELLANEOUS) ×2 IMPLANT
COVER BACK TABLE REUSABLE LG (DRAPES) ×2 IMPLANT
COVER MAYO STAND STRL (DRAPES) ×2 IMPLANT
COVER WAND RF STERILE (DRAPES) ×2 IMPLANT
DERMABOND ADVANCED (GAUZE/BANDAGES/DRESSINGS)
DERMABOND ADVANCED .7 DNX12 (GAUZE/BANDAGES/DRESSINGS) ×1 IMPLANT
DRAPE 3/4 80X56 (DRAPES) ×6 IMPLANT
DRAPE INCISE IOBAN 66X60 STRL (DRAPES) ×2 IMPLANT
DRAPE ORTHO SPLIT 77X108 STRL (DRAPES) ×1
DRAPE SURG 17X11 SM STRL (DRAPES) ×2 IMPLANT
DRAPE SURG ORHT 6 SPLT 77X108 (DRAPES) ×1 IMPLANT
DRAPE U-SHAPE 47X51 STRL (DRAPES) ×2 IMPLANT
DRSG OPSITE POSTOP 4X10 (GAUZE/BANDAGES/DRESSINGS) ×2 IMPLANT
DRSG OPSITE POSTOP 4X8 (GAUZE/BANDAGES/DRESSINGS) ×1 IMPLANT
ELECT BLADE 6.5 EXT (BLADE) ×2 IMPLANT
ELECT CAUTERY BLADE 6.4 (BLADE) ×2 IMPLANT
ELECT REM PT RETURN 9FT ADLT (ELECTROSURGICAL) ×2
ELECTRODE REM PT RTRN 9FT ADLT (ELECTROSURGICAL) ×1 IMPLANT
GAUZE SPONGE 4X4 12PLY STRL (GAUZE/BANDAGES/DRESSINGS) ×2 IMPLANT
GAUZE XEROFORM 1X8 LF (GAUZE/BANDAGES/DRESSINGS) ×2 IMPLANT
GLOVE SRG 8 PF TXTR STRL LF DI (GLOVE) ×2 IMPLANT
GLOVE SURG ORTHO LTX SZ8 (GLOVE) ×4 IMPLANT
GLOVE SURG UNDER POLY LF SZ8 (GLOVE) ×2
GOWN STRL REUS W/ TWL LRG LVL3 (GOWN DISPOSABLE) ×1 IMPLANT
GOWN STRL REUS W/ TWL XL LVL3 (GOWN DISPOSABLE) ×1 IMPLANT
GOWN STRL REUS W/TWL LRG LVL3 (GOWN DISPOSABLE) ×1
GOWN STRL REUS W/TWL XL LVL3 (GOWN DISPOSABLE) ×1
HEAD MODULAR ENDO (Orthopedic Implant) ×1 IMPLANT
HEAD UNPLR 46XMDLR STRL HIP (Orthopedic Implant) IMPLANT
HEMOVAC 400ML (MISCELLANEOUS)
HOOD PEEL AWAY FLYTE STAYCOOL (MISCELLANEOUS) ×2 IMPLANT
IV NS IRRIG 3000ML ARTHROMATIC (IV SOLUTION) ×2 IMPLANT
KIT DRAIN HEMOVAC JP 7FR 400ML (MISCELLANEOUS) IMPLANT
KIT TURNOVER KIT A (KITS) ×2 IMPLANT
MANIFOLD NEPTUNE II (INSTRUMENTS) ×4 IMPLANT
NDL FILTER BLUNT 18X1 1/2 (NEEDLE) ×1 IMPLANT
NDL MAYO CATGUT SZ4 TPR NDL (NEEDLE) ×1 IMPLANT
NDL SAFETY ECLIPSE 18X1.5 (NEEDLE) ×1 IMPLANT
NEEDLE FILTER BLUNT 18X 1/2SAF (NEEDLE) ×1
NEEDLE FILTER BLUNT 18X1 1/2 (NEEDLE) ×1 IMPLANT
NEEDLE HYPO 18GX1.5 SHARP (NEEDLE) ×1
NEEDLE HYPO 22GX1.5 SAFETY (NEEDLE) ×2 IMPLANT
NEEDLE MAYO CATGUT SZ4 (NEEDLE) ×2 IMPLANT
NS IRRIG 1000ML POUR BTL (IV SOLUTION) ×2 IMPLANT
PACK HIP PROSTHESIS (MISCELLANEOUS) ×2 IMPLANT
PASSER SUT FIRSTPASS SELF (INSTRUMENTS) ×1 IMPLANT
PENCIL SMOKE EVACUATOR (MISCELLANEOUS) ×2 IMPLANT
PILLOW ABDUCTION FOAM SM (MISCELLANEOUS) ×2 IMPLANT
PULSAVAC PLUS IRRIG FAN TIP (DISPOSABLE) ×2
RETRIEVER SUT HEWSON (MISCELLANEOUS) IMPLANT
SLEEVE UNITRAX V40 STD (Orthopedic Implant) ×1 IMPLANT
STAPLER SKIN PROX 35W (STAPLE) ×2 IMPLANT
STEM HIP 4 127DEG (Stem) ×1 IMPLANT
SUT ETHIBOND #5 BRAIDED 30INL (SUTURE) ×2 IMPLANT
SUT MNCRL 4-0 (SUTURE) ×1
SUT MNCRL 4-0 27XMFL (SUTURE) ×1
SUT VIC AB 0 CT1 36 (SUTURE) ×2 IMPLANT
SUT VIC AB 2-0 CT2 27 (SUTURE) ×4 IMPLANT
SUTURE MNCRL 4-0 27XMF (SUTURE) ×1 IMPLANT
SYR 20ML LL LF (SYRINGE) ×2 IMPLANT
SYR 50ML LL SCALE MARK (SYRINGE) ×2 IMPLANT
TAPE MICROFOAM 4IN (TAPE) ×1 IMPLANT
TAPE TRANSPORE STRL 2 31045 (GAUZE/BANDAGES/DRESSINGS) ×1 IMPLANT
TIP BRUSH PULSAVAC PLUS 24.33 (MISCELLANEOUS) ×2 IMPLANT
TIP FAN IRRIG PULSAVAC PLUS (DISPOSABLE) ×1 IMPLANT
TUBE KAMVAC SUCTION (TUBING) ×2 IMPLANT
TUBE SUCT KAM VAC (TUBING) ×1 IMPLANT

## 2020-12-20 NOTE — Anesthesia Preprocedure Evaluation (Addendum)
Anesthesia Evaluation  Patient identified by MRN, date of birth, ID band Patient awake    Reviewed: Allergy & Precautions, H&P , NPO status , reviewed documented beta blocker date and time   Airway Mallampati: III  TM Distance: >3 FB Neck ROM: limited    Dental  (+) Upper Dentures, Missing, Caps   Pulmonary COPD, former smoker,    Pulmonary exam normal        Cardiovascular hypertension, +CHF  Normal cardiovascular exam+ Valvular Problems/Murmurs   09/2020 ECHO INTERPRETATION  NORMAL LEFT VENTRICULAR SYSTOLIC FUNCTION  WITH MILD LVH  NORMAL RIGHT VENTRICULAR SYSTOLIC FUNCTION  MILD VALVULAR REGURGITATION  NO VALVULAR STENOSIS  TRIVIAL AR, PR  MILD MR, TR  EF 50%     Neuro/Psych    GI/Hepatic GERD  Medicated and Controlled,  Endo/Other    Renal/GU Renal disease     Musculoskeletal  (+) Arthritis ,   Abdominal   Peds  Hematology   Anesthesia Other Findings Past Medical History: No date: Congestive heart disease (HCC) No date: COPD (chronic obstructive pulmonary disease) (HCC) No date: GERD (gastroesophageal reflux disease) No date: Hyperlipidemia No date: Hypertension  Past Surgical History: No date: CATARACT EXTRACTION, BILATERAL 2012: COLONOSCOPY     Comment:  normal- cleared for 10- Iftikhar No date: VAGINAL HYSTERECTOMY  BMI    Body Mass Index: 22.93 kg/m      Reproductive/Obstetrics                            Anesthesia Physical Anesthesia Plan  ASA: II  Anesthesia Plan: Spinal and MAC   Post-op Pain Management:    Induction: Intravenous  PONV Risk Score and Plan: 2 and Ondansetron, Treatment may vary due to age or medical condition and Propofol infusion  Airway Management Planned: Nasal Cannula and Natural Airway  Additional Equipment:   Intra-op Plan:   Post-operative Plan:   Informed Consent: I have reviewed the patients History and Physical, chart,  labs and discussed the procedure including the risks, benefits and alternatives for the proposed anesthesia with the patient or authorized representative who has indicated his/her understanding and acceptance.     Dental Advisory Given  Plan Discussed with: CRNA  Anesthesia Plan Comments:        Anesthesia Quick Evaluation

## 2020-12-20 NOTE — Progress Notes (Incomplete)
80 year old white female Community dwelling Known history of cardiomyopathy HFpEF-last EF [id number] normal systolic function with mild LVH on 09/20/2020 Cardiac murmur HTN CKD stage IIIb Chronic dyspnea felt secondary to underlying COPD Patient had a recent fall 12/06/2020 and presented to [facility] medical [facility] care w for sutures removal  Accidental fall ambulating in kitchen 4/13-given fentanyl Zofran by EMS Dr. Leim Fabry orthopedics consulted and patient admitted  On arrival respiratory rate 35 creatinine 1.3 EKG sinus rhythm LAFB QTC 513 CXR linear atelectasis X-ray hip = left femoral neck acute fracture  Acute closed left hip femoral fracture Slightly prolonged QTC COPD without exacerbation HFpEF EF 50% previously 09/20/2020 CKD 3B Cardiac murmur HTN Hyperlipidemia  Data BUNs/creatinine 18/1.3-->16/1.09 Hemoglobin 12.2-->11.5

## 2020-12-20 NOTE — H&P (Signed)
Paper H&P to be scanned into permanent record. H&P reviewed. No significant changes noted.  

## 2020-12-20 NOTE — Telephone Encounter (Signed)
Copied from [place] [id number]. Topic: General - Other >> Dec 20, 2020  3:36 PM Keene Breath wrote: Reason for CRM: Patient's husband called to inform Baxter Flattery that the patient is out of surgery and in recovery.  Just wanted to let her know.

## 2020-12-20 NOTE — Anesthesia Procedure Notes (Signed)
Date/Time: 12/20/2020 1:00 PM Performed by: Nelda Marseille, CRNA Pre-anesthesia Checklist: Patient identified, Emergency Drugs available, Suction available, Patient being monitored and Timeout performed Oxygen Delivery Method: Simple face mask

## 2020-12-20 NOTE — Progress Notes (Signed)
PROGRESS NOTE   Marie Villa  [id number] DOB: 09/27/1940 DOA: 12/19/2020 PCP: Juline Patch, MD  Brief Narrative:   80 year old white female Community dwelling Known history of cardiomyopathy HFpEF-last EF [id number] normal systolic function with mild LVH on 09/20/2020 Cardiac murmur HTN CKD stage IIIb Chronic dyspnea felt secondary to underlying COPD Patient had a recent fall 12/06/2020 and presented to [facility] medical clinic care w for sutures removal  Accidental fall ambulating in kitchen 4/13-given fentanyl Zofran by EMS Dr. Leim Fabry orthopedics consulted and patient admitted  On arrival respiratory rate 35 creatinine 1.3 EKG sinus rhythm LAFB QTC 513 CXR linear atelectasis X-ray hip = left femoral neck acute fracture   Hospital-Problem based course  Acute closed left hip femoral fracture Status post surgery Dr. Posey Pronto 4/14 Therapy evaluations from 4/15-weightbearing precautions, anticoagulation, pain management, outpatient follow-up as per orthopedic surgeon P.o. oxycodone as per orthopedics IV Toradol first choice, morphine second choice for severe pain CKD stage IIIb Continue saline X 24 hours Compensated HFpEF last EF [id number] normal diastolic parameters Monitor trends Continue Coreg 3.125 twice daily, ramipril 2.5 Stable COPD Continue inhaler Wixela, no current wheeze can continue Flonase in addition Normocytic anemia Continue ferrous sulfate   DVT prophylaxis: Lovenox Code Status: Full code confirmed Family Communication: Discussed with the patient's son and husband at the bedside Disposition:  Status is: Inpatient  Remains inpatient appropriate because:Hemodynamically unstable, Ongoing active pain requiring inpatient pain management and Ongoing diagnostic testing needed not appropriate for outpatient work up   Dispo: The patient is from: Home              Anticipated d/c is to: SNF              Patient currently is not medically stable to d/c.   Difficult to  place patient No       Consultants:   Orthopedics  PROCEDURE 12/20/2020 Dr. Posey Pronto orthopedics:  1.  Left hip hemiarthroplasty 2.  Left open gluteus medius repair  Antimicrobials: None   Subjective:  Seen postoperatively in room with family She feels well She does not desaturate off of oxygen Pain seems controlled at this time  Objective: Vitals:   12/20/20 1545 12/20/20 1600 12/20/20 1615 12/20/20 1630  BP: (!) 95/57 112/67 110/78 132/64  Pulse: 83 84 89 89  Resp: 20 (!) 23 (!) 21 15  Temp: 98.1 F (36.7 C)   98.8 F (37.1 C)  TempSrc:      SpO2: 100% 96% 100% 98%  Weight:      Height:        Intake/Output Summary (Last 24 hours) at 12/20/2020 1734 Last data filed at 12/20/2020 1630 Gross per 24 hour  Intake 1196.98 ml  Output 900 ml  Net 296.98 ml   Filed Weights   12/19/20 2013  Weight: 62.5 kg    Examination:  EOMI NCAT large bruise/scab over forehead No icterus no pallor Asthenic bitemporal supraclavicular wasting S1-S2 mild tachycardia less than 100 regular rate rhythm Abdomen soft no rebound Knee immobilizers and hip immobilizers in place bilaterally Psych euthymic coherent  Data Reviewed: personally reviewed   CBC    Component Value Date/Time   WBC 9.0 12/20/2020 0534   RBC 3.53 (L) 12/20/2020 0534   HGB 11.5 (L) 12/20/2020 0534   HGB 12.4 10/19/2020 0944   HCT 34.2 (L) 12/20/2020 0534   HCT 35.9 10/19/2020 0944   PLT 233 12/20/2020 0534   PLT 265 10/19/2020 0944   MCV 96.9 12/20/2020 0534  MCV 96 10/19/2020 0944   MCH 32.6 12/20/2020 0534   MCHC 33.6 12/20/2020 0534   RDW 12.8 12/20/2020 0534   RDW 12.4 10/19/2020 0944   LYMPHSABS 1.9 10/19/2020 0944   MONOABS 0.8 05/02/2020 1137   EOSABS 0.8 (H) 10/19/2020 0944   BASOSABS 0.1 10/19/2020 0944   CMP Latest Ref Rng & Units 12/20/2020 12/19/2020 10/19/2020  Glucose 70 - 99 mg/dL 125(H) 135(H) 107(H)  BUN 8 - 23 mg/dL '16 18 17  '$ Creatinine 0.44 - 1.00 mg/dL 1.09(H) 1.32(H) 1.51(H)   Sodium 135 - 145 mmol/L 141 141 140  Potassium 3.5 - 5.1 mmol/L 3.9 4.1 4.5  Chloride 98 - 111 mmol/L 109 109 101  CO2 22 - 32 mmol/L 22 22 19(L)  Calcium 8.9 - 10.3 mg/dL 8.6(L) 9.5 9.8  Total Protein 6.0 - 8.5 g/dL - - 7.0  Total Bilirubin 0.0 - 1.2 mg/dL - - 0.5  Alkaline Phos 44 - 121 IU/L - - 152(H)  AST 0 - 40 IU/L - - 22  ALT 0 - 32 IU/L - - 20     Radiology Studies: DG Chest 1 View  Result Date: 12/19/2020 CLINICAL DATA:  Status post fall. EXAM: CHEST  1 VIEW COMPARISON:  September 14, 2016 FINDINGS: Mild linear scarring and/or atelectasis is seen along the medial aspect of the left lung base. There is no evidence of a pleural effusion or pneumothorax. The cardiac silhouette is markedly enlarged. There is moderate severity calcification of the aortic arch. There is a large hiatal hernia. The visualized skeletal structures are unremarkable. IMPRESSION: 1. Cardiomegaly with mild left basilar linear scarring and/or atelectasis. 2. Large hiatal hernia. Electronically Signed   By: Virgina Norfolk M.D.   On: 12/19/2020 22:05   DG Pelvis Portable  Result Date: 12/20/2020 CLINICAL DATA:  Status post left hip hemi arthroplasty EXAM: PORTABLE PELVIS 1-2 VIEWS COMPARISON:  None. FINDINGS: Pelvic ring is intact. Left hip hemiarthroplasty is noted in satisfactory position. No acute bony abnormality is seen. IMPRESSION: Status post left hip hemiarthroplasty. Electronically Signed   By: Inez Catalina M.D.   On: 12/20/2020 16:49   DG HIP PORT UNILAT WITH PELVIS 1V LEFT  Result Date: 12/20/2020 CLINICAL DATA:  Left hip replacement. EXAM: DG HIP (WITH OR WITHOUT PELVIS) 1V PORT LEFT COMPARISON:  December 19, 2020. FINDINGS: The femoral prosthesis of the left hip arthroplasty appears to be well situated. Expected postoperative changes are seen in the surrounding soft tissues. IMPRESSION: Femoral prosthesis of left hip arthroplasty is well situated. Electronically Signed   By: Marijo Conception M.D.   On:  12/20/2020 15:06   DG Hip Unilat W or Wo Pelvis 2-3 Views Left  Result Date: 12/19/2020 CLINICAL DATA:  Status post fall. EXAM: DG HIP (WITH OR WITHOUT PELVIS) 2-3V LEFT COMPARISON:  None. FINDINGS: Acute fracture deformity is seen extending through the neck of the proximal left femur. Approximately 1 shaft width superolateral displacement of the distal fracture site is noted. There is no evidence of dislocation. Mild soft tissue swelling is noted along the lateral aspect of the left hip. IMPRESSION: Acute fracture of the proximal left femur. Electronically Signed   By: Virgina Norfolk M.D.   On: 12/19/2020 22:03     Scheduled Meds: . acetaminophen  1,000 mg Oral Q8H  . atorvastatin  20 mg Oral Daily  . calcium-vitamin D  2 tablet Oral Daily  . carvedilol  3.125 mg Oral BID  . docusate sodium  100 mg Oral BID  . [  START ON 12/21/2020] enoxaparin (LOVENOX) injection  40 mg Subcutaneous Q24H  . famotidine  40 mg Oral Daily  . ferrous sulfate  325 mg Oral Daily  . fluticasone  1 spray Each Nare Daily  . ketorolac  7.5 mg Intravenous Q6H  . loratadine  10 mg Oral Daily  . mometasone-formoterol  2 puff Inhalation BID  . pantoprazole  40 mg Oral Daily  . ramipril  2.5 mg Oral Daily  . vitamin B-12  1,000 mcg Oral Daily   Continuous Infusions: . sodium chloride 100 mL/hr at 12/20/20 1216  . sodium chloride    .  ceFAZolin (ANCEF) IV Stopped (12/19/20 2340)  .  ceFAZolin (ANCEF) IV    . tranexamic acid       LOS: 1 day   Time spent: 27 minutes  Nita Sells, MD [facility] To contact the attending provider between 7A-7P or the covering provider during after hours 7P-7A, please log into the web site [email or web address] and access using universal [place] password for that web site. If you do not have the password, please call the hospital operator.  12/20/2020, 5:34 PM

## 2020-12-20 NOTE — Op Note (Addendum)
DATE OF SURGERY: 12/20/2020  PREOPERATIVE DIAGNOSIS:  1.  Left femoral neck fracture  POSTOPERATIVE DIAGNOSIS:  1.  Left femoral neck fracture 2.  Full-thickness gluteus medius tear  PROCEDURE:  1.  Left hip hemiarthroplasty 2.  Left open gluteus medius repair  SURGEON: Cato Mulligan, MD  ANESTHESIA: spinal  EBL: 100 cc  COMPONENTS:  Stryker - Accolade II Size 4 Stem Stryker - Unitrax 39m head with neutral neck   INDICATIONS: Marie HEMis a 80y.o. female who sustained a displaced femoral neck fracture after a fall. Risks and benefits of hip hemiarthroplasty were explained to the patient and/or family. Risks include but are not limited to bleeding, infection, injury to tissues, nerves, vessels, periprosthetic infection, dislocation, limb length discrepancy and risks of anesthesia. The patient and/or family understands these risks, has completed an informed consent and wishes to proceed.   PROCEDURE:  The patient was identified in the preoperative holding area and the operative extremity was marked.  The patient was then transferred to the operating room suite and mobilized from the hospital gurney to the operating room table. Anesthesia was administered without complication. The patient was then transitioned to a lateral position.  All bony prominences were padded per protocol.  An axillary roll was placed.  Careful attention was paid to the contralateral side peroneal nerve, which was free from pressure with use of appropriate padding and blankets. A time-out was performed to confirm the patient's identity and the correct laterality of surgery. The patient was then prepped and draped in the usual sterile fashion. Appropriate pre-operative antibiotics were administered. Tranexamic acid was administered preoperatively.    An incision that centered on the posterior tip of the greater trochanter with a posterior curve was made. Dissection was carried down through the subcutaneous  tissue.  Careful attention was made to maintain hemostasis using electrocautery.  Dissection brought uKoreato the level of the deep fascia where the gluteus maximus muscle and proximal portion of the IT band were identified.  The proximal region of the IT band was incised in linear fashion and this incision was extended proximally in a curvilinear fashion to split the gluteus maximus muscle parallel to its fibers to minimize bleeding.  This was accomplished using a combination of bovie electrocautery as well as blunt dissection.  The trochanteric bursa was then visualized and dissected from anterior to posterior.  The gluteus medius was noted to have a large full-thickness tear off of the greater trochanter.  A blunt homan retractor was placed beneath the abductors. The piriformis tendon and short external rotators were visualized. Bovie electrocautery was used to cut these with the capsule as one L-shaped flap. This was tagged at the corner with #5 Ethibond. At this point, the femoral neck fracture was visualized. An oscillating saw was used to make a new neck cut approximately 114mabove the lesser tuberosity with the use of a neck cut guide. The head was then freed from its remaining soft tissue attachments and measured. The head trial was then inserted into the acetabulum and the appropriate sized head was selected.    We then turned our attention to preparing the femoral canal. First, a box cut was performed utilizing the box osteotome. A canal finder was inserted by hand and sequential broaching was then performed. The calcar planer was inserted onto the broach and used to smooth the calcar appropriately.  A trial stem, neutral neck, and head were inserted into the acetabulum and placed through range of motion.  Intraoperative radiographs were obtained to assess component position and leg length. The hip was again dislocated and the femoral trial components were removed.  The actual stem was inserted into the  femoral canal and then driven onto the calcar. The trial head was then again inserted on the femoral component and found to be appropriate. The trial head was then removed and the permanent head was Morse tapered onto the femoral stem and then reduced into the acetabulum.    The hip stability and length were reassessed and found to be satisfactory.  The wound was then copiously irrigated with normal saline solution.  Attention was then turned towards the gluteus medius repair.  A rongeur was used to create a bleeding bed over the greater trochanter footprint.  An Iconix SPEED anchor doubled loaded with tape was placed at the superior tip anteriorly.  A second anchor was attempted to be placed posteriorly, but this pulled out of the bone.  Therefore the sutures from this anchor were loaded onto an Arthrex 4.75 mm SwiveLock anchor and placed in the same pilot hole.  These served as a medial row anchors.  All strands of suture were passed through the gluteus medius tendon.  One strand of each pair of sutures was then loaded onto a 4.3m SwiveLock anchor. The other strands were also loaded onto another 4.713mSwiveLock anchor. These two anchors were placed distal to the footprint on the the greater trochanter, and the served as lateral row fixation.  This achieved excellent compression and stability of the gluteus medius tendon onto the greater trochanter footprint.   Next, the tagged sutures of the capsule and piriformis were sewn to the gluteus medius tendon just proximal to the insertion on the greater trochanter. This adequately closed the hip capsule. The IT band and gluteus maximus fascia were then closed with 0-Vicryl in a running, locked fashion. A mixture of Exparil and bupivicaine was administered.  The subdermal layer was closed with 2-0 Vicryl in a buried interrupted fashion. Skin was approximated with staples.  Sterile dressing was applied.  An abduction pillow was placed. The patient was mobilized  from the lateral position back to supine on the operating room table and then awakened from anesthesia without complication.  POSTOPERATIVE PLAN: The patient will be WBAT on operative extremity x 6 weeks. No resisted abduction. Lovenox '40mg'$ /day x 4 weeks to start on POD#1. IV Abx x 24 hours. PT/OT on POD#1. Posterior hip precautions.

## 2020-12-20 NOTE — Transfer of Care (Signed)
Immediate Anesthesia Transfer of Care Note  Patient: Marie Villa  Procedure(s) Performed: ARTHROPLASTY BIPOLAR HIP (HEMIARTHROPLASTY) (Left Hip)  Patient Location: PACU  Anesthesia Type:General  Level of Consciousness: drowsy and patient cooperative  Airway & Oxygen Therapy: Patient Spontanous Breathing and Patient connected to face mask oxygen  Post-op Assessment: Report given to RN and Post -op Vital signs reviewed and stable  Post vital signs: Reviewed and stable  Last Vitals:  Vitals Value Taken Time  BP 99/75 12/20/20 1536  Temp    Pulse 85 12/20/20 1535  Resp 25 12/20/20 1543  SpO2 95 % 12/20/20 1535  Vitals shown include unvalidated device data.  Last Pain:  Vitals:   12/20/20 1041  TempSrc:   PainSc: 3          Complications: No complications documented.

## 2020-12-20 NOTE — Anesthesia Procedure Notes (Addendum)
Spinal  Patient location during procedure: OR Start time: 12/20/2020 1:00 PM End time: 12/20/2020 1:05 PM Reason for block: surgical anesthesia Staffing Performed: resident/CRNA  Resident/CRNA: Nelda Marseille, CRNA Preanesthetic Checklist Completed: patient identified, IV checked, site marked, risks and benefits discussed, surgical consent, monitors and equipment checked, pre-op evaluation and timeout performed Spinal Block Patient position: right lateral decubitus Prep: Betadine Patient monitoring: heart rate, continuous pulse ox, blood pressure and cardiac monitor Approach: midline Location: L3-4 Injection technique: single-shot Needle Needle type: Whitacre and Introducer  Needle gauge: 25 G Needle length: 9 cm Assessment Sensory level: T10 Events: CSF return Additional Notes Negative paresthesia. Negative blood return. Positive free-flowing CSF. Expiration date of kit checked and confirmed. Patient tolerated procedure well, without complications.

## 2020-12-20 NOTE — Plan of Care (Signed)
  Problem: Education: Goal: Knowledge of General Education information will improve Description: Including pain rating scale, medication(s)/side effects and non-pharmacologic comfort measures Outcome: Progressing Note: Patient profile completed. Patient only has pain with movement. Patient is NPO for surgery. Abrasions noted on knee and forehead.

## 2020-12-20 NOTE — Telephone Encounter (Signed)
Pt husband is calling to report that the pt is out of surgery CB- [phone]

## 2020-12-20 NOTE — Consult Note (Signed)
ORTHOPAEDIC CONSULTATION  REQUESTING PHYSICIAN: Nita Sells, MD  Chief Complaint:   L hip pain  History of Present Illness: Marie Villa is a 80 y.o. female who had a fall yesterday at home after tripping in her kitchen.  The patient noted immediate hip pain and inability to ambulate.  The patient ambulates unassisted at baseline.  The patient lives independently at home with her husband. Pain is described as sharp at its worst and a dull ache at its best.  Pain is rated a 10 out of 10 in severity.  Pain is improved with rest and immobilization.  Pain is worse with any sort of movement.  X-rays in the emergency department show a left displaced femoral neck fracture.  The patient has a medical history significant for COPD, CHF, CKD, hypertension, dyslipidemia.  She is not on any blood thinners.  Past Medical History:  Diagnosis Date  . Congestive heart disease ([place])   . COPD (chronic obstructive pulmonary disease) ([place])   . GERD (gastroesophageal reflux disease)   . Hyperlipidemia   . Hypertension    Past Surgical History:  Procedure Laterality Date  . CATARACT EXTRACTION, BILATERAL    . COLONOSCOPY  2012   normal- cleared for 10- Ift[facility]har  . VAGINAL HYSTERECTOMY     Social History   Socioeconomic History  . Marital status: Married    Spouse name: Torren Durnan  . Number of children: 2  . Years of education: Not on file  . Highest education level: Not on file  Occupational History  . Occupation: Retired  Tobacco Use  . Smoking status: Former Smoker    Types: Cigarettes  . Smokeless tobacco: Never Used  . Tobacco comment: quit over 50 years  Vaping Use  . Vaping Use: Never used  Substance and Sexual Activity  . Alcohol use: Yes    Alcohol/week: 1.0 standard drink    Types: 1 Cans of beer per week  . Drug use: Never  . Sexual activity: Yes  Other Topics Concern  . Not on file  Social History  Narrative  . Not on file   Social Determinants of Health   Financial Resource Strain: Low Risk   . Difficulty of Paying Living Expenses: Not hard at all  Food Insecurity: No Food Insecurity  . Worried About Charity fundraiser in the Last Year: Never true  . Ran Out of Food in the Last Year: Never true  Transportation Needs: No Transportation Needs  . Lack of Transportation (Medical): No  . Lack of Transportation (Non-Medical): No  Physical Activity: Insufficiently Active  . Days of Exercise per Week: 3 days  . Minutes of Exercise per Session: 30 min  Stress: No Stress Concern Present  . Feeling of Stress : Only a little  Social Connections: Moderately Integrated  . Frequency of Communication with Friends and Family: More than three times a week  . Frequency of Social Gatherings with Friends and Family: Once a week  . Attends Religious Services: More than 4 times per year  . Active Member of Clubs or Organizations: No  . Attends Archivist Meetings: Never  . Marital Status: Married   Family History  Problem Relation Age of Onset  . Breast cancer Neg Hx    Allergies  Allergen Reactions  . Sulfa Antibiotics Hives   Prior to Admission medications   Medication Sig Start Date End Date Taking? Authorizing Provider  acetaminophen (TYLENOL) 325 MG tablet Take 325 mg by mouth as needed.  Yes [provider]  amitriptyline (ELAVIL) 10 MG tablet Take 1 tablet (10 mg total) by mouth at bedtime. 10/22/20  Yes Juline Patch, MD  atorvastatin (LIPITOR) 20 MG tablet Take 1 tablet (20 mg total) by mouth daily. 10/22/20  Yes Juline Patch, MD  Calcium Carb-Cholecalciferol (CALCIUM-VITAMIN D) 600-400 MG-UNIT TABS Take 2 tablets by mouth daily.   Yes [provider]  carvedilol (COREG) 3.125 MG tablet Take 1 tablet (3.125 mg total) by mouth 2 (two) times daily. [place] 10/22/20  Yes Juline Patch, MD  cetirizine (ZYRTEC) 10 MG tablet TAKE 1 TABLET(10 MG) BY MOUTH  DAILY 11/13/20  Yes Juline Patch, MD  famotidine (PEPCID) 40 MG tablet Take 1 tablet (40 mg total) by mouth daily. 10/22/20  Yes Juline Patch, MD  ferrous sulfate 324 MG TBEC Take 324 mg by mouth daily.   Yes [provider]  fluticasone (FLONASE) 50 MCG/ACT nasal spray SHAKE LIQUID AND USE 1 SPRAY IN EACH NOSTRIL TWICE DAILY 10/22/20  Yes Juline Patch, MD  Fluticasone-Salmeterol (WIXELA INHUB) 250-50 MCG/DOSE AEPB Inhale 1 puff into the lungs 2 (two) times daily. 11/22/20  Yes Juline Patch, MD  Glucos-Chond-Hyal Ac-Ca Fructo (MOVE FREE JOINT HEALTH ADVANCE PO) Take 1 tablet by mouth daily.   Yes [provider]  MEGARED OMEGA-3 KRILL OIL PO Take 1 capsule by mouth daily.   Yes [provider]  meloxicam (MOBIC) 7.5 MG tablet Take 7.5 mg by mouth daily as needed. 05/24/20  Yes [provider]  omeprazole (PRILOSEC) 20 MG capsule Take 1 capsule (20 mg total) by mouth daily. 10/22/20  Yes Juline Patch, MD  ramipril (ALTACE) 2.5 MG capsule Take 1 capsule (2.5 mg total) by mouth daily. Dr Clayborn Bigness 11/26/18  Yes Juline Patch, MD  vitamin B-12 (CYANOCOBALAMIN) 1000 MCG tablet Take 1,000 mcg by mouth daily.   Yes [provider]   Recent Labs    12/19/20 2015 12/20/20 0534  WBC 9.4 9.0  HGB 12.2 11.5*  HCT 36.1 34.2*  PLT 284 233  K 4.1 3.9  CL 109 109  CO2 22 22  BUN 18 16  CREATININE 1.32* 1.09*  GLUCOSE 135* 125*  CALCIUM 9.5 8.6*  INR 1.0  --    DG Chest 1 View  Result Date: 12/19/2020 CLINICAL DATA:  Status post fall. EXAM: CHEST  1 VIEW COMPARISON:  September 14, 2016 FINDINGS: Mild linear scarring and/or atelectasis is seen along the medial aspect of the left lung base. There is no evidence of a pleural effusion or pneumothorax. The cardiac silhouette is markedly enlarged. There is moderate severity calcification of the aortic arch. There is a large hiatal hernia. The visualized skeletal structures are unremarkable. IMPRESSION: 1.  Cardiomegaly with mild left basilar linear scarring and/or atelectasis. 2. Large hiatal hernia. Electronically Signed   By: Virgina Norfolk M.D.   On: 12/19/2020 22:05   DG Hip Unilat W or Wo Pelvis 2-3 Views Left  Result Date: 12/19/2020 CLINICAL DATA:  Status post fall. EXAM: DG HIP (WITH OR WITHOUT PELVIS) 2-3V LEFT COMPARISON:  None. FINDINGS: Acute fracture deformity is seen extending through the neck of the proximal left femur. Approximately 1 shaft width superolateral displacement of the distal fracture site is noted. There is no evidence of dislocation. Mild soft tissue swelling is noted along the lateral aspect of the left hip. IMPRESSION: Acute fracture of the proximal left femur. Electronically Signed   By: Joyce Gross.D.  On: 12/19/2020 22:03     Positive ROS: All other systems have been reviewed and were otherwise negative with the exception of those mentioned in the HPI and as above.  Physical Exam: BP (!) 144/77 (BP Location: Right Arm)   Pulse 98   Temp 97.8 F (36.6 C)   Resp 16   Ht '5\' 5"'$  (1.651 m)   Wt 62.5 kg   SpO2 94%   BMI 22.93 kg/m  General:  Alert, no acute distress Psychiatric:  Patient is competent for consent with normal mood and affect   Cardiovascular:  No pedal edema, regular rate and rhythm Respiratory:  No wheezing, non-labored breathing GI:  Abdomen is soft and non-tender Skin:  No lesions in the area of chief complaint, no erythema Neurologic:  Sensation intact distally, CN grossly intact Lymphatic:  No axillary or cervical lymphadenopathy  Orthopedic Exam:  LLE: + DF/PF/EHL SILT grossly over foot Foot wwp +Log roll/axial load Leg shortened and externally rotated   X-rays:  As above: L displaced femoral neck fracture  Assessment/Plan: TINLEIGH LEIFER is a 80 y.o. female with a L displaced femoral neck fracture   1. I discussed the various treatment options including both surgical and non-surgical management of the fracture with  the patient and/or family. We discussed the high risk of perioperative complications due to patient's age and other co-morbidities. After discussion of risks, benefits, and alternatives to surgery, the family and/or patient were in agreement to proceed with surgery. The goals of surgery would be to provide adequate pain relief and allow for mobilization. Plan for surgery is L hip hemiarthroplasty today, 12/20/20 2. NPO until OR 3. Hold anticoagulation in advance of OR   Leim Fabry   12/20/2020 12:19 PM

## 2020-12-21 ENCOUNTER — Encounter: Payer: Self-pay | Admitting: Orthopedic Surgery

## 2020-12-21 DIAGNOSIS — S72002A Fracture of unspecified part of neck of left femur, initial encounter for closed fracture: Secondary | ICD-10-CM | POA: Diagnosis not present

## 2020-12-21 LAB — BASIC METABOLIC PANEL
Anion gap: 8 (ref 5–15)
BUN: 16 mg/dL (ref 8–23)
CO2: 20 mmol/L — ABNORMAL LOW (ref 22–32)
Calcium: 8.2 mg/dL — ABNORMAL LOW (ref 8.9–10.3)
Chloride: 110 mmol/L (ref 98–111)
Creatinine, Ser: 1.31 mg/dL — ABNORMAL HIGH (ref 0.44–1.00)
GFR, Estimated: 41 mL/min — ABNORMAL LOW (ref >60)
Glucose, Bld: 113 mg/dL — ABNORMAL HIGH (ref 70–99)
Potassium: 4.2 mmol/L (ref 3.5–5.1)
Sodium: 138 mmol/L (ref 135–145)

## 2020-12-21 LAB — CBC
HCT: 29.6 % — ABNORMAL LOW (ref 36.0–46.0)
Hemoglobin: 9.9 g/dL — ABNORMAL LOW (ref 12.0–15.0)
MCH: 32.8 pg (ref 26.0–34.0)
MCHC: 33.4 g/dL (ref 30.0–36.0)
MCV: 98 fL (ref 80.0–100.0)
Platelets: 187 10*3/uL (ref 150–400)
RBC: 3.02 MIL/uL — ABNORMAL LOW (ref 3.87–5.11)
RDW: 13.2 % (ref 11.5–15.5)
WBC: 9.1 10*3/uL (ref 4.0–10.5)
nRBC: 0 % (ref 0.0–0.2)

## 2020-12-21 MED ORDER — SODIUM CHLORIDE 0.9 % IV SOLN: INTRAVENOUS | Status: DC

## 2020-12-21 MED ORDER — AMITRIPTYLINE HCL 10 MG PO TABS
10.0000 mg | ORAL_TABLET | Freq: Every day | ORAL | Status: DC
  Administered 2020-12-21: 10 mg via ORAL
  Filled 2020-12-21 (×2): qty 1

## 2020-12-21 NOTE — Progress Notes (Addendum)
Met with the patient in the room at the bedside, she has fallen a couple of times in the last few weeks at home, She does not have any DME at home and will need a RW and a 3 in 1 Rhonda with [employer] Notified to deliver to the room She would like to go home if possible, PT still needs to work with her, [facility] is set up in the event that she goes home with PT Will continue to monitor for needs  PASSR number Obtained in case going to SNF [id number] A FL2 completed

## 2020-12-21 NOTE — NC FL2 (Signed)
[place] LEVEL OF CARE SCREENING TOOL     IDENTIFICATION  Patient Name: Marie Villa Birthdate: October 16, 1940 Sex: female Admission Date (Current Location): 12/19/2020  [place] and [place] Number:  Engineering geologist and Address:  [facility], [address], [place], [place] [address]      Provider Number: [id number]  Attending Physician Name and Address:  Nita Sells, MD  Relative Name and Phone Number:  Concepcion Elk [phone]    Current Level of Care: Hospital Recommended Level of Care: [place] Prior Approval Number:    Date Approved/Denied:   PASRR Number: [id number]  Discharge Plan: SNF    Current Diagnoses: Patient Active Problem List   Diagnosis Date Noted  . Closed left hip fracture ([place]) 12/19/2020  . Head trauma 12/17/2020  . Visit for suture removal 12/17/2020  . Primary insomnia 10/22/2020  . Left knee pain 11/25/2018  . Primary osteoarthritis of left knee 11/25/2018  . Essential hypertension 10/12/2017  . COPD (chronic obstructive pulmonary disease) ([place]) 04/10/2017  . Simple chronic bronchitis ([place]) 06/09/2016  . Other seasonal allergic rhinitis 06/09/2016  . Cardiomyopathy ([place]) 06/06/2015  . Chronic kidney disease, stage 3b ([place]) 06/06/2015  . CCF (congestive cardiac failure) ([place]) 06/06/2015  . 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[place]) 06/06/2015  . Gastroesophageal reflux disease 06/06/2015  . Hypercholesteremia 06/06/2015  . Cardiac murmur 12/16/2013  . SOB (shortness of breath) 12/16/2013    Orientation RESPIRATION BLADDER Height & Weight     Self,Time,Situation,Place  Normal Continent Weight: 62.5 kg Height:  '5\' 5"'$  (165.1 cm)  BEHAVIORAL SYMPTOMS/MOOD NEUROLOGICAL BOWEL NUTRITION STATUS      Continent Diet (Regular)  AMBULATORY STATUS COMMUNICATION OF NEEDS Skin   Extensive Assist Verbally Surgical wounds                       Personal Care Assistance Level of  Assistance  Dressing,Bathing Bathing Assistance: Limited assistance   Dressing Assistance: Limited assistance     Functional Limitations Info             SPECIAL CARE FACTORS FREQUENCY  PT (By licensed PT)     PT Frequency: 5 times per week              Contractures Contractures Info: Not present    Additional Factors Info  Code Status,Allergies Code Status Info: Full Allergies Info: SUlfa Antibiotics           Current Medications (12/21/2020):  This is the current hospital active medication list Current Facility-Administered Medications  Medication Dose Route Frequency Provider Last Rate Last Admin  . 0.9 %  sodium chloride infusion   Intravenous Continuous Nita Sells, MD      . acetaminophen (TYLENOL) tablet 1,000 mg  1,000 mg Oral Q8H Leim Fabry, MD   1,000 mg at 12/21/20 0538  . amitriptyline (ELAVIL) tablet 10 mg  10 mg Oral QHS Nita Sells, MD      . atorvastatin (LIPITOR) tablet 20 mg  20 mg Oral Daily Leim Fabry, MD   20 mg at 12/20/20 0941  . bisacodyl (DULCOLAX) suppository 10 mg  10 mg Rectal Daily PRN Leim Fabry, MD      . calcium-vitamin D (OSCAL WITH D) 500-200 MG-UNIT per tablet 2 tablet  2 tablet Oral Daily Leim Fabry, MD   2 tablet at 12/20/20 0940  . carvedilol (COREG) tablet 3.125 mg  3.125 mg Oral BID Leim Fabry, MD   3.125 mg at  12/20/20 2134  . docusate sodium (COLACE) capsule 100 mg  100 mg Oral BID Leim Fabry, MD   100 mg at 12/20/20 2134  . enoxaparin (LOVENOX) injection 40 mg  40 mg Subcutaneous Q24H Leim Fabry, MD   40 mg at 12/21/20 0841  . famotidine (PEPCID) tablet 40 mg  40 mg Oral Daily Leim Fabry, MD   40 mg at 12/20/20 0941  . ferrous sulfate tablet 325 mg  325 mg Oral Daily Leim Fabry, MD   325 mg at 12/20/20 0940  . HYDROmorphone (DILAUDID) injection 0.2-0.4 mg  0.2-0.4 mg Intravenous Q4H PRN Leim Fabry, MD      . ketorolac (TORADOL) 15 MG/ML injection 7.5 mg  7.5 mg Intravenous Q6H Leim Fabry, MD   7.5 mg at 12/21/20 0539  . loratadine (CLARITIN) tablet 10 mg  10 mg Oral Daily Leim Fabry, MD   10 mg at 12/20/20 0939  . magnesium hydroxide (MILK OF MAGNESIA) suspension 30 mL  30 mL Oral Daily PRN Leim Fabry, MD      . menthol-cetylpyridinium (CEPACOL) lozenge 3 mg  1 lozenge Oral PRN Leim Fabry, MD       Or  . phenol (CHLORASEPTIC) mouth spray 1 spray  1 spray Mouth/Throat PRN Leim Fabry, MD      . mometasone-formoterol [facility]) 200-5 MCG/ACT inhaler 2 puff  2 puff Inhalation BID Leim Fabry, MD   2 puff at 12/20/20 2135  . ondansetron (ZOFRAN) tablet 4 mg  4 mg Oral Q6H PRN Leim Fabry, MD       Or  . ondansetron [facility]) injection 4 mg  4 mg Intravenous Q6H PRN Leim Fabry, MD      . oxyCODONE (Oxy IR/ROXICODONE) immediate release tablet 2.5-5 mg  2.5-5 mg Oral Q4H PRN Leim Fabry, MD      . oxyCODONE (Oxy IR/ROXICODONE) immediate release tablet 5-10 mg  5-10 mg Oral Q4H PRN Leim Fabry, MD      . pantoprazole (PROTONIX) EC tablet 40 mg  40 mg Oral Daily Leim Fabry, MD   40 mg at 12/21/20 0841  . ramipril (ALTACE) capsule 2.5 mg  2.5 mg Oral Daily Leim Fabry, MD   2.5 mg at 12/20/20 0943  . senna-docusate (Senokot-S) tablet 1 tablet  1 tablet Oral QHS PRN Leim Fabry, MD      . sodium phosphate (FLEET) 7-19 GM/118ML enema 1 enema  1 enema Rectal Once PRN Leim Fabry, MD      . traMADol Veatrice [facility]bon) tablet 50 mg  50 mg Oral Q6H PRN Leim Fabry, MD      . traZODone (DESYREL) tablet 25 mg  25 mg Oral QHS PRN Leim Fabry, MD      . vitamin B-12 (CYANOCOBALAMIN) tablet 1,000 mcg  1,000 mcg Oral Daily Leim Fabry, MD   1,000 mcg at 12/20/20 0940     Discharge Medications: Please see discharge summary for a list of discharge medications.  Relevant Imaging Results:  Relevant Lab Results:   Additional Information SS# [id number]  Su Hilt, RN

## 2020-12-21 NOTE — Evaluation (Signed)
Physical Therapy Evaluation Patient Details Name: Marie Villa MRN: [id number] DOB: 11-17-1940 Today's Date: 12/21/2020   History of Present Illness  Marie Villa is a 80 y.o. female who sustained a displaced femoral neck fracture after a fall, along with full-thickness gluteus medius tear.  Pt is s/p posterior L THA and Glute Med repair, which prevents active L hip abduction.     Clinical Impression  Pt received supine in bed upon arrival to room and agreeable to therapy.  Ppt initially on bed pan upon arrival to room with OT, however was finished upon entering the room.  Pt was able to verbally express precautions after being taught and given packet.  Decreased carryover effect following a few minutes.  Pt vitals were taken for orthostatic hypotension due to recent falls.  Pt non-symptomatic, however BP dropping >26 in systolic pressure after standing for 3 minutes.  Pt requires verbal cuing for navigation of bed mobility safely and abiding by precautions in place.  Pt given exercises packet along with written precautions to adhere to.  Pt able to transfer with CGA to standing position with use of FWW.  Pt able to demonstrate equal weight distribution through B LE's, although noting slight increase in pain of the L LE.  Pt continued to ambulate to the door and back with CGA and verbal cuing for hand placement back to the recliner where she was left with all needs met.  Pt will benefit from skilled PT intervention to increase independence and safety with basic mobility in preparation for discharge to the venue listed below.    Pt will require stair training for safe d/c home at this time.          Follow Up Recommendations Home health PT;Supervision for mobility/OOB    Equipment Recommendations  Rolling walker with 5" wheels;3in1 (PT)    Recommendations for Other Services       Precautions / Restrictions Precautions Precautions: Posterior Hip Precaution Booklet Issued: Yes  (comment) Precaution Comments: No L Hip ABduction due to glute med repair Restrictions Weight Bearing Restrictions: Yes LLE Weight Bearing: Weight bearing as tolerated      Mobility  Bed Mobility Overal bed mobility: Needs Assistance Bed Mobility: Supine to Sit     Supine to sit: Min assist;Min guard     General bed mobility comments: Use of verbal and tactile cues for hand placement and adhering to precautions.    Transfers Overall transfer level: Needs assistance Equipment used: Rolling walker (2 wheeled) Transfers: Sit to/from Stand Sit to Stand: Min guard            Ambulation/Gait Ambulation/Gait assistance: Min guard Gait Distance (Feet): 18 Feet Assistive device: Rolling walker (2 wheeled) Gait Pattern/deviations: Step-through pattern;Decreased step length - right;Decreased stance time - left Gait velocity: decreased   General Gait Details: Pt able to ambulate navigate room with verbal cuing for turns to prevent IR of the L LE.  Stairs            Wheelchair Mobility    Modified Rankin (Stroke Patients Only)       Balance Overall balance assessment: Needs assistance Sitting-balance support: No upper extremity supported;Feet supported Sitting balance-Leahy Scale: Fair     Standing balance support: Bilateral upper extremity supported Standing balance-Leahy Scale: Fair                               Pertinent Vitals/Pain Pain Assessment: No/denies pain  Home Living Family/patient expects to be discharged to:: Private residence Living Arrangements: Spouse/significant other Available Help at Discharge: Family Type of Home: House Home Access: Stairs to enter Entrance Stairs-Rails: Psychiatric nurse of Steps: 2 Home Layout: One level Home Equipment: None      Prior Function Level of Independence: Independent               Hand Dominance   Dominant Hand: Right    Extremity/Trunk Assessment    Upper Extremity Assessment Upper Extremity Assessment: Overall WFL for tasks assessed    Lower Extremity Assessment Lower Extremity Assessment: Generalized weakness;LLE deficits/detail LLE Deficits / Details: s/p posterior TKA and Glute Med repair       Communication   Communication: No difficulties  Cognition Arousal/Alertness: Awake/alert Behavior During Therapy: WFL for tasks assessed/performed Overall Cognitive Status: Within Functional Limits for tasks assessed                                        General Comments      Exercises General Exercises - Lower Extremity Ankle Circles/Pumps: AROM;Strengthening;Both;10 reps;Supine Quad Sets: AROM;Strengthening;Both;10 reps;Supine Long Arc Quad: AROM;Strengthening;Both;10 reps;Seated Heel Slides: AROM;Strengthening;Right;10 reps;Supine Hip ABduction/ADduction: AROM;Strengthening;Right;10 reps;Supine Straight Leg Raises: AROM;Strengthening;Right;10 reps;Supine Other Exercises Other Exercises: Pt educated on roles of PT and services provided during hospital stay.  Pt also educated on benefits of exercises and the physiological response from performing exercises during stay.   Assessment/Plan    PT Assessment Patient needs continued PT services  PT Problem List Decreased strength;Decreased range of motion;Decreased activity tolerance;Decreased balance;Decreased mobility;Decreased knowledge of use of DME;Decreased safety awareness;Decreased knowledge of precautions;Pain       PT Treatment Interventions DME instruction;Gait training;Stair training;Functional mobility training;Therapeutic activities;Therapeutic exercise;Neuromuscular re-education;Patient/family education    PT Goals (Current goals can be found in the Care Plan section)  Acute Rehab PT Goals Patient Stated Goal: To go home PT Goal Formulation: With patient/family Time For Goal Achievement: 01/04/21 Potential to Achieve Goals: Fair    Frequency  BID   Barriers to discharge        Co-evaluation PT/OT/SLP Co-Evaluation/Treatment: Yes Reason for Co-Treatment: For patient/therapist safety;To address functional/ADL transfers PT goals addressed during session: Mobility/safety with mobility;Balance;Proper use of DME;Strengthening/ROM OT goals addressed during session: ADL's and self-care;Proper use of Adaptive equipment and DME;Strengthening/ROM       AM-PAC PT "6 Clicks" Mobility  Outcome Measure Help needed turning from your back to your side while in a flat bed without using bedrails?: A Little Help needed moving from lying on your back to sitting on the side of a flat bed without using bedrails?: A Little Help needed moving to and from a bed to a chair (including a wheelchair)?: A Little Help needed standing up from a chair using your arms (e.g., wheelchair or bedside chair)?: A Little Help needed to walk in hospital room?: A Little Help needed climbing 3-5 steps with a railing? : A Lot 6 Click Score: 17    End of Session Equipment Utilized During Treatment: Gait belt Activity Tolerance: Patient tolerated treatment well Patient left: in chair;with call bell/phone within reach;with chair alarm set;with family/visitor present Nurse Communication: Mobility status PT Visit Diagnosis: Unsteadiness on feet (R26.81);Other abnormalities of gait and mobility (R26.89);Repeated falls (R29.6);Muscle weakness (generalized) (M62.81);History of falling (Z91.81);Difficulty in walking, not elsewhere classified (R26.2);Pain Pain - Right/Left: Left Pain - part of body: Hip    Time:  1000-1051 PT Time Calculation (min) (ACUTE ONLY): 51 min   Charges:   PT Evaluation $PT Eval Low Complexity: 1 Low PT Treatments $Gait Training: 8-22 mins $Therapeutic Exercise: 8-22 mins        Gwenlyn Saran, PT, DPT 12/21/20, 1:47 PM

## 2020-12-21 NOTE — Progress Notes (Signed)
Physical Therapy Treatment Patient Details Name: Marie Villa MRN: [id number] DOB: [birth year] Today's Date: 12/21/2020    History of Present Illness Marie Villa is a 80 y.o. female who sustained a displaced femoral neck fracture after a fall, along with full-thickness gluteus medius tear.  Pt is s/p posterior L THA and Glute Med repair, which prevents active L hip abduction.    PT Comments    Pt received in Semi-Fowler's position and agreeable to therapy.  Pt reports she was able to get into bed with assistance from nursing.  Pt is ready to attempt walking again this PM session.  Pt is able to recall 4/4 of her precautions at this session.  Pt able to perform bed-level exercises without difficulty as noted below.  Pt attempted to stand without use of FWW and was unable to perform without LOB.  Pt returned to sitting at EOB and utilized FWW for safety and support.  Pt then able to ambulate 40 ft with CGA.  Pt takes small steps and encouraged to increase step length.  Pt transferred back to bed with precautions in mind and was left with all needs met.  Son and husband both in room upon leaving and call bell and phone within reach of pt.  Current discharge plans to home with HHPT remain appropriate at this time, as long as pt is able to perform stair training at next session.  Pt will continue to benefit from skilled therapy in order to address deficits listed below.        Follow Up Recommendations  Home health PT;Supervision for mobility/OOB     Equipment Recommendations  Rolling walker with 5" wheels;3in1 (PT)    Recommendations for Other Services       Precautions / Restrictions Precautions Precautions: Posterior Hip Precaution Booklet Issued: Yes (comment) Precaution Comments: No L Hip ABduction due to glute med repair Restrictions Weight Bearing Restrictions: Yes LLE Weight Bearing: Weight bearing as tolerated    Mobility  Bed Mobility Overal bed mobility: Needs  Assistance Bed Mobility: Supine to Sit     Supine to sit: Min assist;Min guard     General bed mobility comments: Use of verbal and tactile cues for hand placement and adhering to precautions.    Transfers Overall transfer level: Needs assistance Equipment used: Rolling walker (2 wheeled) Transfers: Sit to/from Stand Sit to Stand: Min guard         General transfer comment: Cues for safe hand placement  Ambulation/Gait Ambulation/Gait assistance: Min guard Gait Distance (Feet): 40 Feet Assistive device: Rolling walker (2 wheeled) Gait Pattern/deviations: Step-through pattern;Decreased step length - right;Decreased stance time - left Gait velocity: decreased   General Gait Details: Pt able to ambulate navigate room with verbal cuing for turns to prevent IR of the L LE.   Stairs             Wheelchair Mobility    Modified Rankin (Stroke Patients Only)       Balance Overall balance assessment: Needs assistance Sitting-balance support: No upper extremity supported;Feet supported Sitting balance-Leahy Scale: Fair     Standing balance support: Bilateral upper extremity supported Standing balance-Leahy Scale: Fair Standing balance comment: Able to maintain balance while performing bimanual grooming task                            Cognition Arousal/Alertness: Awake/alert Behavior During Therapy: WFL for tasks assessed/performed Overall Cognitive Status: Within Functional Limits for tasks  assessed                                 General Comments: Only able to recall 2/4 precautions at end of session. Requires cues to adhere to precaution during functional mobility      Exercises General Exercises - Lower Extremity Ankle Circles/Pumps: AROM;Strengthening;Both;10 reps;Supine Quad Sets: AROM;Strengthening;Both;10 reps;Supine Long Arc Quad: AROM;Strengthening;Both;10 reps;Seated Heel Slides: AROM;Strengthening;Right;10  reps;Supine Hip ABduction/ADduction: AROM;Strengthening;Right;10 reps;Supine Straight Leg Raises: AROM;Strengthening;Right;10 reps;Supine Other Exercises Other Exercises: Pt also instructed on how to implement precautions during ADLs and functional mobility and educated on DME/AE for LB bathing and dressing tasks and compression stocking mgt strategies; handout provided.    General Comments        Pertinent Vitals/Pain Pain Assessment: No/denies pain    Home Living Family/patient expects to be discharged to:: Private residence Living Arrangements: Spouse/significant other Available Help at Discharge: Family Type of Home: House Home Access: Stairs to enter Entrance Stairs-Rails: Right;Left Home Layout: One level Home Equipment: None      Prior Function Level of Independence: Independent          PT Goals (current goals can now be found in the care plan section) Acute Rehab PT Goals Patient Stated Goal: To go home PT Goal Formulation: With patient/family Time For Goal Achievement: 01/04/21 Potential to Achieve Goals: Fair Progress towards PT goals: Progressing toward goals    Frequency    BID      PT Plan      Co-evaluation PT/OT/SLP Co-Evaluation/Treatment: Yes Reason for Co-Treatment: Complexity of the patient's impairments (multi-system involvement);To address functional/ADL transfers PT goals addressed during session: Mobility/safety with mobility;Balance;Proper use of DME OT goals addressed during session: ADL's and self-care;Proper use of Adaptive equipment and DME;Strengthening/ROM      AM-PAC PT "6 Clicks" Mobility   Outcome Measure  Help needed turning from your back to your side while in a flat bed without using bedrails?: A Little Help needed moving from lying on your back to sitting on the side of a flat bed without using bedrails?: A Little Help needed moving to and from a bed to a chair (including a wheelchair)?: A Little Help needed standing up  from a chair using your arms (e.g., wheelchair or bedside chair)?: A Little Help needed to walk in hospital room?: A Little Help needed climbing 3-5 steps with a railing? : A Lot 6 Click Score: 17    End of Session Equipment Utilized During Treatment: Gait belt Activity Tolerance: Patient tolerated treatment well Patient left: in chair;with call bell/phone within reach;with chair alarm set;with family/visitor present Nurse Communication: Mobility status PT Visit Diagnosis: Unsteadiness on feet (R26.81);Other abnormalities of gait and mobility (R26.89);Repeated falls (R29.6);Muscle weakness (generalized) (M62.81);History of falling (Z91.81);Difficulty in walking, not elsewhere classified (R26.2);Pain Pain - Right/Left: Left Pain - part of body: Hip     Time: 5670-1410 PT Time Calculation (min) (ACUTE ONLY): 39 min  Charges:  $Gait Training: 23-37 mins $Therapeutic Exercise: 8-22 mins                     Gwenlyn Saran, PT, DPT 12/21/20, 5:59 PM    Christie Nottingham 12/21/2020, 5:54 PM

## 2020-12-21 NOTE — Evaluation (Signed)
Occupational Therapy Evaluation Patient Details Name: JORDYNN FOSHEE MRN: [id number] DOB: [birth year] Today's Date: 12/21/2020    History of Present Illness ELLARIE TAN is a 80 y.o. female who sustained a displaced femoral neck fracture after a fall, along with full-thickness gluteus medius tear.  Pt is s/p posterior L THA and Glute Med repair, which prevents active L hip abduction.   Clinical Impression   Pt seen for OT/PT co-evaluation this date, POD#1 from above surgery. Upon arrival to room, pt on bedpan with nurse tech present. Pt agreeable to OT/PT eval and assist from OT for toilet hygiene. Prior to admission, pt was independent in all ADLs and functional mobility and living in a 1-story home with her spouse. At beginning of session, pt educated on posterior hip precautions + additional precaution of avoiding L hip abduction d/t gluteus medius repair, with pt able to recall precautions immediately following education. Pt also instructed on how to implement precautions during ADLs and functional mobility and educated on DME/AE for LB bathing and dressing tasks and compression stocking mgt strategies; handout provided. Pt currently requires MAX A for LB dressing, MOD A for bed-level peri-care, SUPERVISION/SET-UP for standing grooming tasks, and MIN GUARD for functional mobility of short household distances with RW d/t OT Problems listed below. At end of session, pt able to recall 2/4 precautions, requiring MOD verbal cues to adhere to precautions during functional mobility. Pt would benefit from additional instruction in self care skills and techniques to help maintain precautions, with or without assistive devices, to support recall and carryover prior to discharge. Recommend HHOT and supervision/assistance from family upon discharge.      Follow Up Recommendations  Home health OT;Supervision - Intermittent    Equipment Recommendations  3 in 1 bedside commode;Tub/shower bench        Precautions / Restrictions Precautions Precautions: Posterior Hip Precaution Booklet Issued: Yes (comment) Precaution Comments: No L Hip ABduction due to glute med repair Restrictions Weight Bearing Restrictions: Yes LLE Weight Bearing: Weight bearing as tolerated      Mobility Bed Mobility Overal bed mobility: Needs Assistance Bed Mobility: Supine to Sit     Supine to sit: Min assist;Min guard     General bed mobility comments: Use of verbal and tactile cues for hand placement and adhering to precautions.    Transfers Overall transfer level: Needs assistance Equipment used: Rolling walker (2 wheeled) Transfers: Sit to/from Stand Sit to Stand: Min guard         General transfer comment: Cues for safe hand placement    Balance Overall balance assessment: Needs assistance Sitting-balance support: No upper extremity supported;Feet supported Sitting balance-Leahy Scale: Fair     Standing balance support: During functional activity;No upper extremity supported Standing balance-Leahy Scale: Fair Standing balance comment: Able to maintain balance while performing bimanual grooming task                           ADL either performed or assessed with clinical judgement   ADL Overall ADL's : Needs assistance/impaired     Grooming: Brushing hair;Supervision/safety;Set up;Standing               Lower Body Dressing: Maximal assistance;Bed level Lower Body Dressing Details (indicate cue type and reason): to don socks     Toileting- Clothing Manipulation and Hygiene: Moderate assistance;Bed level Toileting - Clothing Manipulation Details (indicate cue type and reason): Pt able to perform frontal peri-care following set-up, assist for posterior  peri-care while bed-level     Functional mobility during ADLs: Min guard;Rolling walker                    Pertinent Vitals/Pain Pain Assessment: No/denies pain     Hand Dominance Right    Extremity/Trunk Assessment Upper Extremity Assessment Upper Extremity Assessment: Overall WFL for tasks assessed   Lower Extremity Assessment Lower Extremity Assessment: Generalized weakness;LLE deficits/detail LLE Deficits / Details: s/p posterior TKA and Glute Med repair   Cervical / Trunk Assessment Cervical / Trunk Assessment: Normal   Communication Communication Communication: No difficulties   Cognition Arousal/Alertness: Awake/alert Behavior During Therapy: WFL for tasks assessed/performed Overall Cognitive Status: Within Functional Limits for tasks assessed                                 General Comments: Only able to recall 2/4 precautions at end of session. Requires cues to adhere to precaution during functional mobility      Exercises  Other Exercises: Pt instructed on how to implement precautions during ADLs and functional mobility and educated on DME/AE for LB bathing and dressing tasks and compression stocking mgt strategies; handout provided.        Home Living Family/patient expects to be discharged to:: Private residence Living Arrangements: Spouse/significant other Available Help at Discharge: Family Type of Home: House Home Access: Stairs to enter [employer] of Steps: 2 Entrance Stairs-Rails: Right;Left Home Layout: One level     Bathroom Shower/Tub: Tub/shower unit;Walk-in shower   Bathroom Toilet: Handicapped height Bathroom Accessibility: Yes   Home Equipment: None          Prior Functioning/Environment Level of Independence: Independent                 OT Problem List: Decreased strength;Decreased range of motion;Impaired balance (sitting and/or standing);Decreased knowledge of precautions;Decreased knowledge of use of DME or AE      OT Treatment/Interventions: Self-care/ADL training;Therapeutic exercise;DME and/or AE instruction;Therapeutic activities;Patient/family education;Balance training    OT  Goals(Current goals can be found in the care plan section) Acute Rehab OT Goals Patient Stated Goal: To go home OT Goal Formulation: With patient Time For Goal Achievement: 01/04/21 Potential to Achieve Goals: Good ADL Goals Pt Will Perform Lower Body Dressing: with modified independence;with adaptive equipment;sit to/from stand Pt Will Transfer to Toilet: with modified independence;ambulating;bedside commode Pt Will Perform Toileting - Clothing Manipulation and hygiene: with modified independence;sit to/from stand  OT Frequency: Min 1X/week           Co-evaluation PT/OT/SLP Co-Evaluation/Treatment: Yes Reason for Co-Treatment: Complexity of the patient's impairments (multi-system involvement);To address functional/ADL transfers PT goals addressed during session: Mobility/safety with mobility;Balance;Proper use of DME OT goals addressed during session: ADL's and self-care;Proper use of Adaptive equipment and DME;Strengthening/ROM      AM-PAC OT "6 Clicks" Daily Activity     Outcome Measure Help from another person eating meals?: None Help from another person taking care of personal grooming?: A Little Help from another person toileting, which includes using toliet, bedpan, or urinal?: A Lot Help from another person bathing (including washing, rinsing, drying)?: A Lot Help from another person to put on and taking off regular upper body clothing?: None Help from another person to put on and taking off regular lower body clothing?: A Lot 6 Click Score: 17   End of Session Equipment Utilized During Treatment: Gait belt;Rolling walker Nurse Communication: Mobility status  Activity Tolerance: Patient tolerated treatment well Patient left: in chair;with call bell/phone within reach;with chair alarm set;with family/visitor present  OT Visit Diagnosis: Unsteadiness on feet (R26.81)                Time: 1000-1048 OT Time Calculation (min): 48 min Charges:  OT General Charges $OT  Visit: 1 Visit OT Evaluation $OT Eval Moderate Complexity: 1 Mod OT Treatments $Self Care/Home Management : 8-22 mins  Fredirick Maudlin, OTR/L [place]

## 2020-12-21 NOTE — Care Management Important Message (Signed)
Important Message  Patient Details  Name: Marie Villa MRN: [id number] Date of Birth: [birth year]   Medicare Important Message Given:  N/A - LOS <3 / Initial given by admissions     Juliann Pulse A Frederick Marro 12/21/2020, 8:18 AM

## 2020-12-21 NOTE — Progress Notes (Signed)
PROGRESS NOTE   Marie Villa  [id number] DOB: [birth year] DOA: 12/19/2020 PCP: Juline Patch, MD  Brief Narrative:   80 year old white female Community dwelling Known history of cardiomyopathy HFpEF-last EF [id number] normal systolic function with mild LVH on 09/20/2020 Cardiac murmur HTN CKD stage IIIb Chronic dyspnea felt secondary to underlying COPD Patient had a recent fall 12/06/2020 and presented to [facility] medical clinic care w for sutures removal  Accidental fall ambulating in kitchen 4/13-given fentanyl Zofran by EMS Dr. Leim Fabry orthopedics consulted and patient admitted  On arrival respiratory rate 35 creatinine 1.3 EKG sinus rhythm LAFB QTC 513 CXR linear atelectasis X-ray hip = left femoral neck acute fracture   Hospital-Problem based course  Acute closed left hip femoral fracture Status post surgery Dr. Posey Pronto 4/14 weightbearing precautions, anticoagulation, pain management, outpatient follow-up as per orthopedic surgeon P.o. oxycodone as per orthopedics IV Toradol first choice, morphine second choice for severe pain Await therapy evaluations prior to decision regarding discharge-suspect given recent falls etc. she may require skilled care TOC has been consulted to assist with the same-she may be able to discharge as early as 4/16 if she is stable AKI superimposed on CKD stage IIIb Continue saline at 50 cc an hour for now and recheck a.m. labs Compensated HFpEF last EF [id number] normal diastolic parameters Monitor trends Continue Coreg 3.125 twice daily, ramipril 2.5 Stable COPD Continue inhaler Wixela,Flonase in addition Normocytic anemia Continue ferrous sulfate   DVT prophylaxis: Lovenox Code Status: Full code confirmed Family Communication: Discussed with the patient's son and husband at the bedside Disposition:  Status is: Inpatient  Remains inpatient appropriate because:Hemodynamically unstable, Ongoing active pain requiring inpatient pain management and  Ongoing diagnostic testing needed not appropriate for outpatient work up   Dispo: The patient is from: Home              Anticipated d/c is to: SNF              Patient currently is not medically stable to d/c.   Difficult to place patient No       Consultants:   Orthopedics  PROCEDURE 12/20/2020 Dr. Posey Pronto orthopedics:  1.  Left hip hemiarthroplasty 2.  Left open gluteus medius repair  Antimicrobials: None   Subjective:  Looks well feels well has no pain but still has both legs immobilized postoperatively Ready for therapy I have encouraged her to take Tylenol to premedicate as she may have pain She does not endorse any chest pain nausea vomiting   Objective: Vitals:   12/20/20 2100 12/21/20 0051 12/21/20 0427 12/21/20 0809  BP: (!) 113/53 (!) 105/49 (!) 110/55 (!) 108/48  Pulse: 99 97 93 86  Resp: '16 18 17 16  '$ Temp: 98.2 F (36.8 C) 98.4 F (36.9 C) 99 F (37.2 C) 99.2 F (37.3 C)  TempSrc: Oral Oral Oral   SpO2: 99% 97% 96% 93%  Weight:      Height:        Intake/Output Summary (Last 24 hours) at 12/21/2020 0837 Last data filed at 12/21/2020 0600 Gross per 24 hour  Intake 1070 ml  Output 1400 ml  Net -330 ml   Filed Weights   12/19/20 2013  Weight: 62.5 kg    Examination:  Coherent pleasant awake alert Upper partial dentures noted Quite asthenic S1-S2 no murmur Chest clear Abdomen soft no rebound no guarding Left sided hip wound examined without any overt extravasation or hematoma Pulses equal bilaterally and dorsalis pedis and can wiggle toes  Data Reviewed: personally reviewed   CBC    Component Value Date/Time   WBC 9.1 12/21/2020 0442   RBC 3.02 (L) 12/21/2020 0442   HGB 9.9 (L) 12/21/2020 0442   HGB 12.4 10/19/2020 0944   HCT 29.6 (L) 12/21/2020 0442   HCT 35.9 10/19/2020 0944   PLT 187 12/21/2020 0442   PLT 265 10/19/2020 0944   MCV 98.0 12/21/2020 0442   MCV 96 10/19/2020 0944   MCH 32.8 12/21/2020 0442   MCHC 33.4  12/21/2020 0442   RDW 13.2 12/21/2020 0442   RDW 12.4 10/19/2020 0944   LYMPHSABS 1.9 10/19/2020 0944   MONOABS 0.8 05/02/2020 1137   EOSABS 0.8 (H) 10/19/2020 0944   BASOSABS 0.1 10/19/2020 0944   CMP Latest Ref Rng & Units 12/21/2020 12/20/2020 12/19/2020  Glucose 70 - 99 mg/dL 113(H) 125(H) 135(H)  BUN 8 - 23 mg/dL '16 16 18  '$ Creatinine 0.44 - 1.00 mg/dL 1.31(H) 1.09(H) 1.32(H)  Sodium 135 - 145 mmol/L 138 141 141  Potassium 3.5 - 5.1 mmol/L 4.2 3.9 4.1  Chloride 98 - 111 mmol/L 110 109 109  CO2 22 - 32 mmol/L 20(L) 22 22  Calcium 8.9 - 10.3 mg/dL 8.2(L) 8.6(L) 9.5  Total Protein 6.0 - 8.5 g/dL - - -  Total Bilirubin 0.0 - 1.2 mg/dL - - -  Alkaline Phos 44 - 121 IU/L - - -  AST 0 - 40 IU/L - - -  ALT 0 - 32 IU/L - - -     Radiology Studies: DG Chest 1 View  Result Date: 12/19/2020 CLINICAL DATA:  Status post fall. EXAM: CHEST  1 VIEW COMPARISON:  September 14, 2016 FINDINGS: Mild linear scarring and/or atelectasis is seen along the medial aspect of the left lung base. There is no evidence of a pleural effusion or pneumothorax. The cardiac silhouette is markedly enlarged. There is moderate severity calcification of the aortic arch. There is a large hiatal hernia. The visualized skeletal structures are unremarkable. IMPRESSION: 1. Cardiomegaly with mild left basilar linear scarring and/or atelectasis. 2. Large hiatal hernia. Electronically Signed   By: Virgina Norfolk M.D.   On: 12/19/2020 22:05   DG Pelvis Portable  Result Date: 12/20/2020 CLINICAL DATA:  Status post left hip hemi arthroplasty EXAM: PORTABLE PELVIS 1-2 VIEWS COMPARISON:  None. FINDINGS: Pelvic ring is intact. Left hip hemiarthroplasty is noted in satisfactory position. No acute bony abnormality is seen. IMPRESSION: Status post left hip hemiarthroplasty. Electronically Signed   By: Inez Catalina M.D.   On: 12/20/2020 16:49   DG HIP PORT UNILAT WITH PELVIS 1V LEFT  Result Date: 12/20/2020 CLINICAL DATA:  Left hip  replacement. EXAM: DG HIP (WITH OR WITHOUT PELVIS) 1V PORT LEFT COMPARISON:  December 19, 2020. FINDINGS: The femoral prosthesis of the left hip arthroplasty appears to be well situated. Expected postoperative changes are seen in the surrounding soft tissues. IMPRESSION: Femoral prosthesis of left hip arthroplasty is well situated. Electronically Signed   By: Marijo Conception M.D.   On: 12/20/2020 15:06   DG Hip Unilat W or Wo Pelvis 2-3 Views Left  Result Date: 12/19/2020 CLINICAL DATA:  Status post fall. EXAM: DG HIP (WITH OR WITHOUT PELVIS) 2-3V LEFT COMPARISON:  None. FINDINGS: Acute fracture deformity is seen extending through the neck of the proximal left femur. Approximately 1 shaft width superolateral displacement of the distal fracture site is noted. There is no evidence of dislocation. Mild soft tissue swelling is noted along the lateral aspect of the  left hip. IMPRESSION: Acute fracture of the proximal left femur. Electronically Signed   By: Virgina Norfolk M.D.   On: 12/19/2020 22:03     Scheduled Meds: . acetaminophen  1,000 mg Oral Q8H  . amitriptyline  10 mg Oral QHS  . atorvastatin  20 mg Oral Daily  . calcium-vitamin D  2 tablet Oral Daily  . carvedilol  3.125 mg Oral BID  . docusate sodium  100 mg Oral BID  . enoxaparin (LOVENOX) injection  40 mg Subcutaneous Q24H  . famotidine  40 mg Oral Daily  . ferrous sulfate  325 mg Oral Daily  . ketorolac  7.5 mg Intravenous Q6H  . loratadine  10 mg Oral Daily  . mometasone-formoterol  2 puff Inhalation BID  . pantoprazole  40 mg Oral Daily  . ramipril  2.5 mg Oral Daily  . vitamin B-12  1,000 mcg Oral Daily   Continuous Infusions: . sodium chloride       LOS: 2 days   Time spent: 17 minutes  Nita Sells, MD [facility] To contact the attending provider between 7A-7P or the covering provider during after hours 7P-7A, please log into the web site [email or web address] and access using universal [place] password for  that web site. If you do not have the password, please call the hospital operator.  12/21/2020, 8:37 AM

## 2020-12-21 NOTE — Progress Notes (Signed)
  Subjective: 1 Day Post-Op Procedure(s) (LRB): ARTHROPLASTY BIPOLAR HIP (HEMIARTHROPLASTY) (Left) Husband and son at bedside. Patient reports pain as well-controlled.   Patient is well, and has had no acute complaints or problems Plan is to go Skilled nursing facility after hospital stay. Negative for chest pain and shortness of breath Fever: no Gastrointestinal: negative for nausea and vomiting.   Patient has not had a bowel movement.  Objective: Vital signs in last 24 hours: Temp:  [97.6 F (36.4 C)-99.2 F (37.3 C)] 97.6 F (36.4 C) (04/15 1118) Pulse Rate:  [83-99] 93 (04/15 1118) Resp:  [15-23] 16 (04/15 1118) BP: (95-132)/(48-84) 113/84 (04/15 1118) SpO2:  [93 %-100 %] 96 % (04/15 1118)  Intake/Output from previous day:  Intake/Output Summary (Last 24 hours) at 12/21/2020 1230 Last data filed at 12/21/2020 0950 Gross per 24 hour  Intake 1190 ml  Output 1400 ml  Net -210 ml    Intake/Output this shift: Total I/O In: 120 [P.O.:120] Out: -   Labs: Recent Labs    12/19/20 2015 12/20/20 0534 12/21/20 0442  HGB 12.2 11.5* 9.9*   Recent Labs    12/20/20 0534 12/21/20 0442  WBC 9.0 9.1  RBC 3.53* 3.02*  HCT 34.2* 29.6*  PLT 233 187   Recent Labs    12/20/20 0534 12/21/20 0442  NA 141 138  K 3.9 4.2  CL 109 110  CO2 22 20*  BUN 16 16  CREATININE 1.09* 1.31*  GLUCOSE 125* 113*  CALCIUM 8.6* 8.2*   Recent Labs    12/19/20 2015  INR 1.0     EXAM General - Patient is Alert, Appropriate and Oriented Extremity - Neurovascular intact Dorsiflexion/Plantar flexion intact Compartment soft Dressing/Incision -[place] in place with only mild sanguinous drainage  Motor Function - intact, moving foot and toes well on exam.  Cardiovascular- Regular rate and rhythm, no murmurs/rubs/gallops Respiratory- Lungs clear to auscultation bilaterally Gastrointestinal- soft, nontender and active bowel sounds   Assessment/Plan: 1 Day Post-Op Procedure(s)  (LRB): ARTHROPLASTY BIPOLAR HIP (HEMIARTHROPLASTY) (Left) Active Problems:   Closed left hip fracture (HCC)  Estimated body mass index is 22.93 kg/m as calculated from the following:   Height as of this encounter: '5\' 5"'$  (1.651 m).   Weight as of this encounter: 62.5 kg. Advance diet Up with therapy   Anticipate d/c to SNF.   Posterior hip precautions, avoid active abduction   DVT Prophylaxis - Lovenox, Ted hose and SCDs Weight-Bearing as tolerated to left leg  Cassell Smiles, PA-C [facility] 12/21/2020, 12:30 PM

## 2020-12-22 DIAGNOSIS — S72002A Fracture of unspecified part of neck of left femur, initial encounter for closed fracture: Secondary | ICD-10-CM | POA: Diagnosis not present

## 2020-12-22 LAB — CBC
HCT: 27.5 % — ABNORMAL LOW (ref 36.0–46.0)
Hemoglobin: 9.1 g/dL — ABNORMAL LOW (ref 12.0–15.0)
MCH: 32.7 pg (ref 26.0–34.0)
MCHC: 33.1 g/dL (ref 30.0–36.0)
MCV: 98.9 fL (ref 80.0–100.0)
Platelets: 158 10*3/uL (ref 150–400)
RBC: 2.78 MIL/uL — ABNORMAL LOW (ref 3.87–5.11)
RDW: 13 % (ref 11.5–15.5)
WBC: 9.3 10*3/uL (ref 4.0–10.5)
nRBC: 0 % (ref 0.0–0.2)

## 2020-12-22 LAB — BASIC METABOLIC PANEL
Anion gap: 6 (ref 5–15)
BUN: 17 mg/dL (ref 8–23)
CO2: 23 mmol/L (ref 22–32)
Calcium: 8.3 mg/dL — ABNORMAL LOW (ref 8.9–10.3)
Chloride: 109 mmol/L (ref 98–111)
Creatinine, Ser: 1.34 mg/dL — ABNORMAL HIGH (ref 0.44–1.00)
GFR, Estimated: 40 mL/min — ABNORMAL LOW (ref >60)
Glucose, Bld: 115 mg/dL — ABNORMAL HIGH (ref 70–99)
Potassium: 4.1 mmol/L (ref 3.5–5.1)
Sodium: 138 mmol/L (ref 135–145)

## 2020-12-22 MED ORDER — SENNA 8.6 MG PO TABS
1.0000 | ORAL_TABLET | Freq: Once | ORAL | Status: AC
  Administered 2020-12-22: 8.6 mg via ORAL
  Filled 2020-12-22: qty 1

## 2020-12-22 MED ORDER — OXYCODONE HCL 5 MG PO TABS: 2.5000 mg | ORAL_TABLET | ORAL | 0 refills | Status: DC | PRN

## 2020-12-22 MED ORDER — OXYCODONE HCL 5 MG PO TABS: 5.0000 mg | ORAL_TABLET | ORAL | 0 refills | Status: DC | PRN

## 2020-12-22 MED ORDER — POLYETHYLENE GLYCOL 3350 17 G PO PACK
17.0000 g | PACK | Freq: Every day | ORAL | Status: DC
  Administered 2020-12-22: 17 g via ORAL
  Filled 2020-12-22: qty 1

## 2020-12-22 MED ORDER — POLYETHYLENE GLYCOL 3350 17 G PO PACK: 17.0000 g | PACK | Freq: Every day | ORAL | 0 refills | Status: AC

## 2020-12-22 MED ORDER — SENNOSIDES-DOCUSATE SODIUM 8.6-50 MG PO TABS: 1.0000 | ORAL_TABLET | Freq: Every evening | ORAL | 0 refills | Status: AC | PRN

## 2020-12-22 MED ORDER — ENOXAPARIN SODIUM 40 MG/0.4ML ~~LOC~~ SOLN: 40.0000 mg | SUBCUTANEOUS | 30 refills | Status: DC

## 2020-12-22 NOTE — Progress Notes (Addendum)
Physical Therapy Treatment Patient Details Name: Marie Villa MRN: [id number] DOB: 05/20/1941 Today's Date: 12/22/2020    History of Present Illness Marie Villa is a 80 y.o. female who sustained a displaced femoral neck fracture after a fall, along with full-thickness gluteus medius tear.  Pt is s/p posterior L THA and Glute Med repair, which prevents active L hip abduction.    PT Comments    Pt received in Semi-Fowler's position and agreeable to therapy.  Pt is only able to recall 1/4 of her hip precautions upon arrival to the room.  Pt is is able to transfer out of bed to seated position at EOB with minimal verbal cuing for hand placement.  Pt then transfers to standing with minA today from therapist due to pain in the hip.  Pt notes a burning sensation but it resolves upon standing.  Pt is able to ambulate to therapy gym with slow, antalgic gait pattern with minimal stride length.  When educated on taking longer strides, pt is able to perform much better and is able to ambulate slightly quicker.  Pt transferred to recliner once in gym for rest break and for visual/verbal cuing for how to manage steps at home.  Pt perform 4 steps x2 with good technique and support from son for management of the [place].  Pt is fatigued from walking and stair training, so was transferred back to room via recliner.  Pt bedding/dressing changed per pt request and d/c discussion was performed with son and pt.  Current discharge plans to home with HHPt remain appropriate at this time.  Pt and family members are agreeable to this plan.  Pt will continue to benefit from skilled therapy in order to address deficits listed below.   Follow Up Recommendations  Home health PT;Supervision for mobility/OOB     Equipment Recommendations  Rolling walker with 5" wheels;3in1 (PT)    Recommendations for Other Services       Precautions / Restrictions Precautions Precautions: Posterior Hip Precaution Booklet Issued: Yes  (comment) Precaution Comments: No L Hip ABduction due to glute med repair Restrictions Weight Bearing Restrictions: Yes LLE Weight Bearing: Weight bearing as tolerated    Mobility  Bed Mobility Overal bed mobility: Needs Assistance Bed Mobility: Supine to Sit     Supine to sit: Min assist;Min guard     General bed mobility comments: Use of verbal and tactile cues for hand placement and adhering to precautions.    Transfers Overall transfer level: Needs assistance Equipment used: Rolling walker (2 wheeled) Transfers: Sit to/from Stand Sit to Stand: Min guard         General transfer comment: Cues for safe hand placement  Ambulation/Gait Ambulation/Gait assistance: Min guard Gait Distance (Feet): 120 Feet Assistive device: Rolling walker (2 wheeled) Gait Pattern/deviations: Step-through pattern;Decreased step length - right;Decreased stance time - left Gait velocity: decreased   General Gait Details: Pt with good technique in order to not perform movement against her precautions.  Pt with slow antalgic, but steady gait, and is able to ambulate to the therapy gym ~120 ft.   Stairs Stairs: Yes Stairs assistance: Min guard Stair Management: No rails;Step to pattern;Backwards;With walker Number of Stairs: 8 General stair comments: Pt able to navigate stairs effectively with son and therapist giving cues for hand placement.   Wheelchair Mobility    Modified Rankin (Stroke Patients Only)       Balance Overall balance assessment: Needs assistance Sitting-balance support: No upper extremity supported;Feet supported Sitting balance-Leahy  Scale: Fair     Standing balance support: Bilateral upper extremity supported Standing balance-Leahy Scale: Fair                              Cognition Arousal/Alertness: Awake/alert Behavior During Therapy: WFL for tasks assessed/performed Overall Cognitive Status: Within Functional Limits for tasks assessed                                  General Comments: Only able to recall 1/4 precautions at beginning of session. Requires cues to adhere to precaution during functional mobility      Exercises General Exercises - Lower Extremity Ankle Circles/Pumps: AROM;Strengthening;Both;10 reps;Supine Quad Sets: AROM;Strengthening;Both;10 reps;Supine Long Arc Quad: AROM;Strengthening;Both;10 reps;Seated Heel Slides: AROM;Strengthening;Right;10 reps;Supine Hip ABduction/ADduction: AROM;Strengthening;Right;10 reps;Supine Straight Leg Raises: AROM;Strengthening;Right;10 reps;Supine    General Comments        Pertinent Vitals/Pain Pain Assessment: Faces Faces Pain Scale: Hurts even more Pain Location: L hip Pain Descriptors / Indicators: Burning Pain Intervention(s): Limited activity within patient's tolerance;Monitored during session;Premedicated before session;Repositioned    Home Living                      Prior Function            PT Goals (current goals can now be found in the care plan section) Acute Rehab PT Goals Patient Stated Goal: To go home PT Goal Formulation: With patient/family Time For Goal Achievement: 01/04/21 Potential to Achieve Goals: Fair Progress towards PT goals: Progressing toward goals    Frequency    BID      PT Plan      Co-evaluation              AM-PAC PT "6 Clicks" Mobility   Outcome Measure  Help needed turning from your back to your side while in a flat bed without using bedrails?: A Little Help needed moving from lying on your back to sitting on the side of a flat bed without using bedrails?: A Little Help needed moving to and from a bed to a chair (including a wheelchair)?: A Little Help needed standing up from a chair using your arms (e.g., wheelchair or bedside chair)?: A Little Help needed to walk in hospital room?: A Little Help needed climbing 3-5 steps with a railing? : A Little 6 Click Score: 18    End of  Session Equipment Utilized During Treatment: Gait belt Activity Tolerance: Patient tolerated treatment well Patient left: in chair;with call bell/phone within reach;with chair alarm set;with family/visitor present Nurse Communication: Mobility status PT Visit Diagnosis: Unsteadiness on feet (R26.81);Other abnormalities of gait and mobility (R26.89);Repeated falls (R29.6);Muscle weakness (generalized) (M62.81);History of falling (Z91.81);Difficulty in walking, not elsewhere classified (R26.2);Pain Pain - Right/Left: Left Pain - part of body: Hip     Time: [id number] PT Time Calculation (min) (ACUTE ONLY): 70 min  Charges:  $Gait Training: 38-52 mins $Therapeutic Exercise: 8-22 mins $Self Care/Home Management: 8-22                     Gwenlyn Saran, PT, DPT 12/22/20, 1:10 PM    Christie Nottingham 12/22/2020, 1:10 PM

## 2020-12-22 NOTE — TOC Transition Note (Signed)
Transition of Care [facility]) - CM/SW Discharge Note   Patient Details  Name: Marie Villa MRN: [id number] Date of Birth: 09-Apr-1941  [facility]) CM/SW Contact:  Harriet Masson, RN Phone Number: [phone] 12/22/2020, 12:39 PM   Clinical Narrative:    PT re-evaluated pt and recommended [facility] PT. [facility] arranged and made aware of pt's discharge for today. Note DME arranged already for 3-1 and RW. Spoke with son [name]) with update on arrangements with [facility] for [facility] services. No further needs. Team has been updated.  TOC will remains available for any D/C needs.   Final next level of care: [place] Barriers to Discharge: No Barriers Identified   Patient Goals and CMS Choice Patient states their goals for this hospitalization and ongoing recovery are:: heal quick      Discharge Placement                  Name of family member notified: [name] (son) Patient and family notified of of transfer: 12/22/20  Discharge Plan and Services   Discharge Planning Services: CM Consult            DME Arranged: Gilford Rile rolling,3-N-1 DME Agency: [employer] Date DME Agency Contacted:  (followed up with [place] [facility] with [place] ([facility])) Time DME Agency Contacted:  (followed up with [place] [facility] with [place] ([facility])) Representative spoke with at DME Agency: [place] [facility] HH Arranged: PT [place]: [facility] [employer] [facility] [employer] Date [place]: 12/21/20 Time [place]: [phone] Representative spoke with at [place]: [place]  Social Determinants of Health ([place]) Interventions     Readmission Risk Interventions No flowsheet data found.

## 2020-12-22 NOTE — Progress Notes (Signed)
Physical Therapy Treatment Patient Details Name: Marie Villa MRN: [id number] DOB: [birth year] Today's Date: 12/22/2020    History of Present Illness Marie Villa is a 80 y.o. female who sustained a displaced femoral neck fracture after a fall, along with full-thickness gluteus medius tear.  Pt is s/p posterior L THA and Glute Med repair, which prevents active L hip abduction.    PT Comments    Pt resting in recliner upon arrival with son and husband present in room.  Pt transferred to PT gym to perform stair training and ambulation.  Pt able to perform stiars with good technique with husband and son assisting throughout training.  Pt then ambulated another 60 feet out of the gym before transferring back to recliner to reserve energy for d/c this PM.  Pt and family members educated on how to perform stair training and car transfers to be more comfortable with d/c home.  Pt left in room in recliner with all needs met and call bell placed within reach.  Current discharge plans to home with HHPT remain appropriate at this time.  Pt will continue to benefit from skilled therapy in order to address deficits listed below.     Follow Up Recommendations  Home health PT;Supervision for mobility/OOB     Equipment Recommendations  Rolling walker with 5" wheels;3in1 (PT)    Recommendations for Other Services       Precautions / Restrictions Precautions Precautions: Posterior Hip Precaution Booklet Issued: Yes (comment) Precaution Comments: No L Hip ABduction due to glute med repair Restrictions Weight Bearing Restrictions: Yes LLE Weight Bearing: Weight bearing as tolerated    Mobility  Bed Mobility Overal bed mobility: Needs Assistance Bed Mobility: Supine to Sit     Supine to sit: Min assist;Min guard     General bed mobility comments: Use of verbal and tactile cues for hand placement and adhering to precautions.    Transfers Overall transfer level: Needs assistance Equipment  used: Rolling walker (2 wheeled) Transfers: Sit to/from Stand Sit to Stand: Min guard         General transfer comment: Cues for safe hand placement  Ambulation/Gait Ambulation/Gait assistance: Min guard Gait Distance (Feet): 120 Feet Assistive device: Rolling walker (2 wheeled) Gait Pattern/deviations: Step-through pattern;Decreased step length - right;Decreased stance time - left Gait velocity: decreased   General Gait Details: Pt performed stair training again this PM session with good mobility.  Pt is in pain, but able to perform safely with son and husband observing.   Stairs Stairs: Yes Stairs assistance: Min guard Stair Management: No rails;Step to pattern;Backwards;With walker Number of Stairs: 8 General stair comments: Pt able to navigate stairs effectively with son and therapist giving cues for hand placement.   Wheelchair Mobility    Modified Rankin (Stroke Patients Only)       Balance Overall balance assessment: Needs assistance Sitting-balance support: No upper extremity supported;Feet supported Sitting balance-Leahy Scale: Fair     Standing balance support: Bilateral upper extremity supported Standing balance-Leahy Scale: Fair                              Cognition Arousal/Alertness: Awake/alert Behavior During Therapy: WFL for tasks assessed/performed Overall Cognitive Status: Within Functional Limits for tasks assessed                                 General Comments:  Able to recall 2/4 precautions at beginning of session.  Pt able to recall most aspects of stair training before attempt.      Exercises General Exercises - Lower Extremity Ankle Circles/Pumps: AROM;Strengthening;Both;10 reps;Supine Quad Sets: AROM;Strengthening;Both;10 reps;Supine Long Arc Quad: AROM;Strengthening;Both;10 reps;Seated Heel Slides: AROM;Strengthening;Right;10 reps;Supine Hip ABduction/ADduction: AROM;Strengthening;Right;10  reps;Supine Straight Leg Raises: AROM;Strengthening;Right;10 reps;Supine    General Comments        Pertinent Vitals/Pain Pain Assessment: Faces Faces Pain Scale: Hurts even more Pain Location: L hip Pain Descriptors / Indicators: Burning;Jabbing Pain Intervention(s): Limited activity within patient's tolerance;Monitored during session;Repositioned    Home Living                      Prior Function            PT Goals (current goals can now be found in the care plan section) Acute Rehab PT Goals Patient Stated Goal: To go home PT Goal Formulation: With patient/family Time For Goal Achievement: 01/04/21 Potential to Achieve Goals: Fair Progress towards PT goals: Progressing toward goals    Frequency    BID      PT Plan      Co-evaluation              AM-PAC PT "6 Clicks" Mobility   Outcome Measure  Help needed turning from your back to your side while in a flat bed without using bedrails?: A Little Help needed moving from lying on your back to sitting on the side of a flat bed without using bedrails?: A Little Help needed moving to and from a bed to a chair (including a wheelchair)?: A Little Help needed standing up from a chair using your arms (e.g., wheelchair or bedside chair)?: A Little Help needed to walk in hospital room?: A Little Help needed climbing 3-5 steps with a railing? : A Little 6 Click Score: 18    End of Session Equipment Utilized During Treatment: Gait belt Activity Tolerance: Patient tolerated treatment well Patient left: in chair;with call bell/phone within reach;with chair alarm set;with family/visitor present Nurse Communication: Mobility status PT Visit Diagnosis: Unsteadiness on feet (R26.81);Other abnormalities of gait and mobility (R26.89);Repeated falls (R29.6);Muscle weakness (generalized) (M62.81);History of falling (Z91.81);Difficulty in walking, not elsewhere classified (R26.2);Pain Pain - Right/Left: Left Pain -  part of body: Hip     Time: 1420-1503 PT Time Calculation (min) (ACUTE ONLY): 43 min  Charges:  $Gait Training: 23-37 mins $Therapeutic Exercise: 8-22 mins $Self Care/Home Management: 8-22                     Gwenlyn Saran, PT, DPT 12/22/20, 3:23 PM    Christie Nottingham 12/22/2020, 3:18 PM

## 2020-12-22 NOTE — TOC Transition Note (Signed)
Transition of Care [facility]) - CM/SW Discharge Note   Patient Details  Name: Marie Villa MRN: [id number] Date of Birth: 16-Jun-1941  [facility]) CM/SW Contact:  Harriet Masson, RN Phone [phone] 12/22/2020, 4:14 PM   Clinical Narrative:    Spoke with son [name]) and Herbie [place] (spouse) concerning possible prescription coverage for Lovenox using her [employer] card or other options for GoodRx for [facility] for approximately $78 for 12 syringes via website. Encouraged spouse and son to call the pt's local pharmacy on the possible price of the prescription for the Lovenox medication. No other request at this time.           Also spoke with the bedside nurse for injection teaching on the Lovenox if prescribed.   TOC will continue to follow for ongoing discharge needs.   Final next level of care: [place] Barriers to Discharge: No Barriers Identified   Patient Goals and CMS Choice Patient states their goals for this hospitalization and ongoing recovery are:: heal quick      Discharge Placement                  Name of family member notified: [name] (son) Patient and family notified of of transfer: 12/22/20  Discharge Plan and Services   Discharge Planning Services: CM Consult            DME Arranged: Gilford Rile rolling,3-N-1 DME Agency: [employer] Date DME Agency Contacted:  (followed up with [place] [facility] with [place] ([facility])) Time DME Agency Contacted:  (followed up with [place] [facility] with [place] ([facility])) Representative spoke with at DME Agency: [place] [facility] HH Arranged: PT [place]: [facility] [employer] [facility] [employer] Date [place]: 12/21/20 Time [place]: [phone] Representative spoke with at [address]: [place]  Social Determinants of Health ([place]) Interventions     Readmission Risk Interventions No flowsheet data found.

## 2020-12-22 NOTE — Progress Notes (Signed)
  Subjective: 2 Days Post-Op Procedure(s) (LRB): ARTHROPLASTY BIPOLAR HIP (HEMIARTHROPLASTY) (Left) Patient reports pain as well-controlled.   Patient is well, and has had no acute complaints or problems Plan is to go Home vs SNF after hospital stay. Negative for chest pain and shortness of breath Fever: no Gastrointestinal: negative for nausea and vomiting.   Patient has not had a bowel movement.  Objective: Vital signs in last 24 hours: Temp:  [97.6 F (36.4 C)-99.6 F (37.6 C)] 99.4 F (37.4 C) (04/16 0809) Pulse Rate:  [93-104] 104 (04/16 0809) Resp:  [16-18] 16 (04/16 0809) BP: (94-134)/(50-84) 134/69 (04/16 0809) SpO2:  [94 %-98 %] 94 % (04/16 0809)  Intake/Output from previous day:  Intake/Output Summary (Last 24 hours) at 12/22/2020 1017 Last data filed at 12/22/2020 0954 Gross per 24 hour  Intake 400 ml  Output 450 ml  Net -50 ml    Intake/Output this shift: No intake/output data recorded.  Labs: Recent Labs    12/19/20 2015 12/20/20 0534 12/21/20 0442 12/22/20 0437  HGB 12.2 11.5* 9.9* 9.1*   Recent Labs    12/21/20 0442 12/22/20 0437  WBC 9.1 9.3  RBC 3.02* 2.78*  HCT 29.6* 27.5*  PLT 187 158   Recent Labs    12/21/20 0442 12/22/20 0437  NA 138 138  K 4.2 4.1  CL 110 109  CO2 20* 23  BUN 16 17  CREATININE 1.31* 1.34*  GLUCOSE 113* 115*  CALCIUM 8.2* 8.3*   Recent Labs    12/19/20 2015  INR 1.0     EXAM General - Patient is Alert, Appropriate and Oriented Extremity - Neurovascular intact Dorsiflexion/Plantar flexion intact Compartment soft Dressing/Incision -mild sanguinous drainage noted Motor Function - intact, moving foot and toes well on exam.  Cardiovascular- Regular rate and rhythm, no murmurs/rubs/gallops Respiratory- Lungs clear to auscultation bilaterally Gastrointestinal- soft, nontender and active bowel sounds   Assessment/Plan: 2 Days Post-Op Procedure(s) (LRB): ARTHROPLASTY BIPOLAR HIP (HEMIARTHROPLASTY)  (Left) Active Problems:   Closed left hip fracture (HCC)  Estimated body mass index is 22.93 kg/m as calculated from the following:   Height as of this encounter: '5\' 5"'$  (1.651 m).   Weight as of this encounter: 62.5 kg. Advance diet Up with therapy  Discharge will be determined based on patient's progress with therapy.  DVT Prophylaxis - Lovenox, Ted hose and SCDs Weight-Bearing as tolerated to left leg, no active abduction  Cassell Smiles, PA-C [facility] 12/22/2020, 10:17 AM

## 2020-12-22 NOTE — Discharge Instructions (Signed)
Hip Fracture  A hip fracture is a break in the upper part of the thigh bone (femur). This is usually the result of an injury, commonly a fall. What are the causes? This condition may be caused by:  A direct hit or injury (trauma) to the side of the hip, such as from a fall or a car accident. What increases the risk? You are more likely to develop this condition if:  You have poor balance or an unsteady walking pattern (gait). Certain conditions contribute to poor balance, including Parkinson disease and dementia.  You have thinning or weakening of your bones, such as from osteopenia or osteoporosis.  You have cancer that spreads to the leg bones.  You have certain conditions that can weaken your bones, such as thyroid disorders, intestine disorders, or a lack (deficiency) of certain nutrients.  You smoke.  You take certain medicines, such as steroids.  You have a history of broken bones. What are the signs or symptoms? Symptoms of this condition include:  Pain over the injured hip. This is commonly felt on the side of the hip or in the front groin area.  Stiffness, bruising, and swelling over the hip.  Pain with movement of the leg, especially lifting it up. Pain often gets better with rest.  Difficulty or inability to stand, walk, or use the leg to support body weight (put weight on the leg).  The leg rolling outward when lying down.  The affected leg being shorter than the other leg. How is this diagnosed? This condition may be diagnosed based on:  Your symptoms.  A physical exam.  X-rays. These may be done: ? To confirm the diagnosis. ? To determine the type and location of the fracture. ? To check for other injuries.  MRI or CT scans. These may be done if the fracture is not visible on an X-ray. How is this treated? Treatment for this condition depends on the severity and location of your fracture. In most cases, surgery is necessary. Surgery may  involve:  Repairing the fracture with a screw, nail, or rod to hold the bone in place (open reduction and internal fixation, ORIF).  Replacing the damaged parts of the femur with metal implants (hemiarthroplasty or arthroplasty). If your fracture is less severe, or if you are not eligible for surgery, you may have non-surgical treatment. Non-surgical treatment may involve:  Using crutches, a walker, or a wheelchair until your health care provider says that you can support (bear) weight on your hip.  Medicines to help reduce pain and swelling.  Having regular X-rays to monitor your fracture and make sure that it is healing.  Physical therapy. You may need physical therapy after surgery, too. Follow these instructions at home: Activity  Do not use your injured leg to support your body weight until your health care provider says that you can. ? Follow standing and walking restrictions as told by your health care provider. ? Use crutches, a walker, or a wheelchair as directed.  Avoid any activities that cause pain or irritation in your hip. Ask your health care provider what activities are safe for you.  Do not drive or use heavy machinery until your health care provider approves.  If physical therapy was prescribed, do exercises as told by your health care provider. General instructions  Take over-the-counter and prescription medicines only as told by your health care provider.  If directed, put ice on the injured area: ? Put ice in a plastic bag. ?   Place a towel between your skin and the bag. ? Leave the ice on for 20 minutes, 2-3 times a day.  Do not use any products that contain nicotine or tobacco, such as cigarettes and e-cigarettes. These can delay bone healing. If you need help quitting, ask your health care provider.  Keep all follow-up visits as told by your health care provider. This is important.   How is this prevented?  To prevent falls at home: ? Use a cane,  walker, or wheelchair as directed. ? Make sure your rooms and hallways are free of clutter, obstacles, and cords. ? Install grab bars in your bedroom and bathrooms. ? Always use handrails when going up and down stairs. ? Use nightlights around the house.  Exercise regularly. Ask what forms of exercise are safe for you, such as walking and strength and balance exercises.  Visit an eye doctor regularly to have your eyesight checked. This can help prevent falls.  Make sure you get enough calcium and vitamin D.  Do not use any products that contain nicotine or tobacco, such as cigarettes and e-cigarettes. If you need help quitting, ask your health care provider.  Limit alcohol use.  If you have an underlying condition that caused your hip fracture, work with your health care provider to manage your condition. Contact a health care provider if:  Your pain gets worse or it does not get better with rest or medicine.  You develop any of the following in your leg or foot: ? Numbness. ? Tingling. ? A change in skin color (discoloration). ? Skin feeling cold to the touch. Get help right away if:  Your pain suddenly gets worse.  You cannot move your hip. Summary  A hip fracture is a break in the upper part of the thigh bone (femur).  Treatment typically requires surgical management to restore stability and function to the hip.  Pain medicine and icing of the affected leg can help manage pain and swelling. Follow directions as told by your health care provider. This information is not intended to replace advice given to you by your health care provider. Make sure you discuss any questions you have with your health care provider. Document Revised: 04/27/2020 Document Reviewed: 04/27/2020 Else[employer] Patient Education  [place].

## 2020-12-22 NOTE — Progress Notes (Signed)
IV removed. D/c education given to son and husband. Pt and family educated on how to give lovanox. Verbal understanding from them. Pt has all belongings, gave an extra honey comb dressing in case they need it. Pt to see how much lovanox costs at [facility], unable to get price from pharmacy over the phone at this time, pt and family are okay with that and will use [facility] if too expensive, informed to call MD if that is the case so the script can be sent over to [facility] instead of [facility]. NT to assist in dressing and transport to family car.

## 2020-12-22 NOTE — Discharge Summary (Signed)
Physician Discharge Summary  Marie Villa [id number] DOB: 1940-09-09 DOA: 12/19/2020  PCP: Juline Patch, MD  Admit date: 12/19/2020 Discharge date: 12/22/2020  Time spent: 42 minutes  Recommendations for Outpatient Follow-up:  1. Lovenox for 1 month ending 01/19/2021 to prevent DVT 2. Needs CBC, Chem-12 1 week 3. Stopped ramipril this admission given mild azotemia  Discharge Diagnoses:  MAIN problem for hospitalization   hip fracture  Please see below for itemized issues addressed in [place]- refer to other progress notes for clarity if needed  Discharge Condition: Improved  Diet recommendation: Heart healthy  Filed Weights   12/19/20 2013  Weight: 62.5 kg    History of present illness:  80 year old white female Community dwelling Known history of cardiomyopathy HFpEF-last EF [id number] normal systolic function with mild LVH on 09/20/2020 Cardiac murmur HTN CKD stage IIIb Chronic dyspnea felt secondary to underlying COPD Patient had a recent fall 12/06/2020 [facility]  Accidental fall ambulating in kitchen 4/13-given fentanyl Zofran by EMS Dr. Leim Fabry orthopedics consulted and patient admitted  On arrival respiratory rate 35 creatinine 1.3 EKG sinus rhythm LAFB QTC 513 CXR linear atelectasis X-ray hip = left femoral neck acute fracture  Subj on day of d/c   Acute closed left hip femoral fracture Status post surgery Dr. Posey Pronto 4/14 weightbearing precautions, anticoagulation, pain management, outpatient follow-up as per orthopedic surgeon P.o. oxycodone prescription written on discharge Will need Lovenox on discharge for 4 weeks Outpatient labs and follow-up as per Dr. Posey Pronto AKI superimposed on CKD stage IIIb Close to normal baseline Ramipril discontinued this admission can follow as an outpatient and reimplement if felt necessary Compensated HFpEF last EF [id number] normal diastolic parameters Monitor  trends Continue Coreg 3.125 twice daily Stable COPD Continue inhaler Wixela,Flonase in addition Normocytic anemia Continue ferrous sulfate   Discharge Exam: Vitals:   12/22/20 0450 12/22/20 0809  BP: (!) 118/58 134/69  Pulse: 95 (!) 104  Resp: 16 16  Temp: 99.6 F (37.6 C) 99.4 F (37.4 C)  SpO2: 95% 94%    General Exam on discharge  Awake alert coherent no distress EOMI NCAT poor dentition upper Abdomen soft no rebound no guarding Neurologically moving all 4 limbs without deficit-patient later assessed ambulating in the hallway with walker with slightly antalgic gait but apparently did really well according to therapy services notes S1-S2 no murmur no rub no gallop  Discharge Instructions   Discharge Instructions    Diet - low sodium heart healthy   Complete by: As directed    Discharge instructions   Complete by: As directed    You will be discharged with pain medication-you can use the opiate medication regularly for at least 3 days and in between you may use over-the-counter ibuprofen 400 mg (2 small tablets) every 6 hours as needed for breakthrough pain-if you are going to take the ibuprofen make sure that you take it on a full stomach  When you have a hip fracture, there is a high risk for getting a DVT in the first 3 to 6 weeks-as such we will prescribe for you Lovenox on discharge which is an injectable medication to prevent blood clots-please use this until you follow-up with Dr. Posey Pronto in the outpatient setting the surgeon who can then advise you otherwise  Report any bleeding chest pain nausea vomiting or fever to your regular physician and see them in 1 week to obtain routine lab work   We have asked therapy services  to come out to your home on Monday and ensure that you continue to make gains and return to your prior level of function   Increase activity slowly   Complete by: As directed    No wound care   Complete by: As directed      Allergies as of  12/22/2020      Reactions   Sulfa Antibiotics Hives      Medication List    STOP taking these medications   meloxicam 7.5 MG tablet Commonly known as: MOBIC   ramipril 2.5 MG capsule Commonly known as: ALTACE     TAKE these medications   acetaminophen 325 MG tablet Commonly known as: TYLENOL Take 325 mg by mouth as needed.   amitriptyline 10 MG tablet Commonly known as: ELAVIL Take 1 tablet (10 mg total) by mouth at bedtime.   atorvastatin 20 MG tablet Commonly known as: LIPITOR Take 1 tablet (20 mg total) by mouth daily.   Calcium-Vitamin D 600-400 MG-UNIT Tabs Take 2 tablets by mouth daily.   carvedilol 3.125 MG tablet Commonly known as: COREG Take 1 tablet (3.125 mg total) by mouth 2 (two) times daily. [place]   cetirizine 10 MG tablet Commonly known as: ZYRTEC TAKE 1 TABLET(10 MG) BY MOUTH DAILY   famotidine 40 MG tablet Commonly known as: PEPCID Take 1 tablet (40 mg total) by mouth daily.   ferrous sulfate 324 MG Tbec Take 324 mg by mouth daily.   fluticasone 50 MCG/ACT nasal spray Commonly known as: FLONASE SHAKE LIQUID AND USE 1 SPRAY IN EACH NOSTRIL TWICE DAILY   Fluticasone-Salmeterol 250-50 MCG/DOSE Aepb Commonly known as: Wixela Inhub Inhale 1 puff into the lungs 2 (two) times daily.   MEGARED OMEGA-3 KRILL OIL PO Take 1 capsule by mouth daily.   MOVE FREE JOINT HEALTH ADVANCE PO Take 1 tablet by mouth daily.   omeprazole 20 MG capsule Commonly known as: PRILOSEC Take 1 capsule (20 mg total) by mouth daily.   oxyCODONE 5 MG immediate release tablet Commonly known as: Oxy IR/ROXICODONE Take 0.5-1 tablets (2.5-5 mg total) by mouth every 4 (four) hours as needed for moderate pain (pain score 4-6).   oxyCODONE 5 MG immediate release tablet Commonly known as: Oxy IR/ROXICODONE Take 1-2 tablets (5-10 mg total) by mouth every 4 (four) hours as needed for severe pain (pain score 7-10).   polyethylene glycol 17 g packet Commonly known as:  MIRALAX / GLYCOLAX Take 17 g by mouth daily. Start taking on: December 23, 2020   senna-docusate 8.6-50 MG tablet Commonly known as: Senokot-S Take 1 tablet by mouth at bedtime as needed for mild constipation.   vitamin B-12 1000 MCG tablet Commonly known as: CYANOCOBALAMIN Take 1,000 mcg by mouth daily.            Durable Medical Equipment  (From admission, onward)         Start     Ordered   12/21/20 0844  For home use only DME Walker rolling  Once       Question Answer Comment  Walker: With 5 Inch Wheels   Patient needs a walker to treat with the following condition Weakness      12/21/20 0844         Allergies  Allergen Reactions  . Sulfa Antibiotics Hives      The results of significant diagnostics from this hospitalization (including imaging, microbiology, ancillary and laboratory) are listed below for reference.    Significant Diagnostic Studies: DG Chest  1 View  Result Date: 12/19/2020 CLINICAL DATA:  Status post fall. EXAM: CHEST  1 VIEW COMPARISON:  September 14, 2016 FINDINGS: Mild linear scarring and/or atelectasis is seen along the medial aspect of the left lung base. There is no evidence of a pleural effusion or pneumothorax. The cardiac silhouette is markedly enlarged. There is moderate severity calcification of the aortic arch. There is a large hiatal hernia. The visualized skeletal structures are unremarkable. IMPRESSION: 1. Cardiomegaly with mild left basilar linear scarring and/or atelectasis. 2. Large hiatal hernia. Electronically Signed   By: Virgina Norfolk M.D.   On: 12/19/2020 22:05   CT Head Wo Contrast  Result Date: 12/06/2020 CLINICAL DATA:  Moderate to severe head trauma. Fall. Laceration of forehead. EXAM: CT HEAD WITHOUT CONTRAST TECHNIQUE: Contiguous axial images were obtained from the base of the skull through the vertex without intravenous contrast. COMPARISON:  None. FINDINGS: Brain: No evidence of acute infarction, hemorrhage,  hydrocephalus, extra-axial collection or mass lesion/mass effect. Periventricular white matter hypodensity is a nonspecific finding, but most commonly relates to chronic ischemic small vessel disease. Vascular: No hyperdense vessel or unexpected calcification. Skull: Normal. Negative for fracture or focal lesion. Sinuses/Orbits: Opacification of the left frontal sinus. Partial opacification of the maxillary and sphenoid sinuses, and ethmoid air cells. Other: Left anterior scalp laceration is noted. Mild supraorbital soft tissue swelling. Small metallic density foreign body noted on the surface of the right face soft tissues (image 5, series 2). This may be foreign body. Please correlate with exam. IMPRESSION: No acute intracranial abnormality. Possible foreign body of the right face. Please correlate with physical exam. Electronically Signed   By: Miachel Roux M.D.   On: 12/06/2020 10:20   CT Cervical Spine Wo Contrast  Result Date: 12/06/2020 CLINICAL DATA:  Neck pain after a fall.  Initial encounter. EXAM: CT CERVICAL SPINE WITHOUT CONTRAST TECHNIQUE: Multidetector CT imaging of the cervical spine was performed without intravenous contrast. Multiplanar CT image reconstructions were also generated. COMPARISON:  None. FINDINGS: Alignment: Facet degenerative disease results in 0.2 cm anterolisthesis C4 on C5 and 0.3 cm anterolisthesis C5 on C6. Skull base and vertebrae: No acute fracture. No primary bone lesion or focal pathologic process. Soft tissues and spinal canal: No prevertebral fluid or swelling. No visible canal hematoma. Disc levels: Loss of disc space height is worst at C6-7 and C7-T1. Advanced multilevel facet degenerative change is seen. Upper chest: Negative. Other: None. IMPRESSION: No acute abnormality. Multilevel spondylosis. Electronically Signed   By: Inge Rise M.D.   On: 12/06/2020 10:21   DG Pelvis Portable  Result Date: 12/20/2020 CLINICAL DATA:  Status post left hip hemi  arthroplasty EXAM: PORTABLE PELVIS 1-2 VIEWS COMPARISON:  None. FINDINGS: Pelvic ring is intact. Left hip hemiarthroplasty is noted in satisfactory position. No acute bony abnormality is seen. IMPRESSION: Status post left hip hemiarthroplasty. Electronically Signed   By: Inez Catalina M.D.   On: 12/20/2020 16:49   DG Knee Complete 4 Views Right  Result Date: 12/06/2020 CLINICAL DATA:  Knee pain after fall. EXAM: RIGHT KNEE - COMPLETE 4+ VIEW COMPARISON:  None. FINDINGS: No evidence of fracture, dislocation, or joint effusion. Chondrocalcinosis. Joint spaces are maintained. Small radiodensities in the superficial soft tissues along the patella. IMPRESSION: 1. No evidence of acute fracture or dislocation. 2. Small radiodensities in the superficial soft tissues along the patella, which could represent vascular calcifications versus foreign bodies. Recommend correlation with direct inspection. 3. Chondrocalcinosis. Electronically Signed   By: Margaretha Sheffield MD  On: 12/06/2020 10:54   DG HIP PORT UNILAT WITH PELVIS 1V LEFT  Result Date: 12/20/2020 CLINICAL DATA:  Left hip replacement. EXAM: DG HIP (WITH OR WITHOUT PELVIS) 1V PORT LEFT COMPARISON:  December 19, 2020. FINDINGS: The femoral prosthesis of the left hip arthroplasty appears to be well situated. Expected postoperative changes are seen in the surrounding soft tissues. IMPRESSION: Femoral prosthesis of left hip arthroplasty is well situated. Electronically Signed   By: Marijo Conception M.D.   On: 12/20/2020 15:06   DG Hip Unilat W or Wo Pelvis 2-3 Views Left  Result Date: 12/19/2020 CLINICAL DATA:  Status post fall. EXAM: DG HIP (WITH OR WITHOUT PELVIS) 2-3V LEFT COMPARISON:  None. FINDINGS: Acute fracture deformity is seen extending through the neck of the proximal left femur. Approximately 1 shaft width superolateral displacement of the distal fracture site is noted. There is no evidence of dislocation. Mild soft tissue swelling is noted along the  lateral aspect of the left hip. IMPRESSION: Acute fracture of the proximal left femur. Electronically Signed   By: Virgina Norfolk M.D.   On: 12/19/2020 22:03   CT Maxillofacial Wo Contrast  Result Date: 12/06/2020 CLINICAL DATA:  Pain following fall EXAM: CT MAXILLOFACIAL WITHOUT CONTRAST TECHNIQUE: Multidetector CT imaging of the maxillofacial structures was performed. Multiplanar CT image reconstructions were also generated. COMPARISON:  None. FINDINGS: Osseous: There is no appreciable fracture or dislocation. No blastic or lytic bone lesions. Orbits: There is preseptal soft tissue swelling on the left. No intraorbital lesion evident. No intraorbital asymmetry. Sinuses: There is opacification of a hypoplastic left frontal sinus. There is opacification of multiple ethmoid air cells bilaterally. There is opacification throughout much of the right maxillary antrum with a much lesser degree of opacification in the left maxillary antrum. Moderate mucosal thickening is noted in the posterior sphenoid sinus region as well. No air-fluid levels. No bony destruction or expansion. There is obstruction of each ostiomeatal unit complex. There is mild rightward deviation of the nasal septum. Obstruction of the superior areas bilaterally noted due to soft tissue opacification. Soft tissues: There is a left frontal scalp hematoma with air in this area. Underlying bone intact. The salivary glands appear symmetric bilaterally. No adenopathy evident. There is calcification in each carotid artery. Tongue and tongue base regions appear normal. Visualized pharynx appears normal. Limited intracranial: There is mild diffuse atrophy. No focal intracranial lesion evident. Degenerative change noted in the visualized cervical spine with mild spondylolisthesis at C4-5 and C5-6 which may be due to spondylosis. IMPRESSION: No fracture or dislocation. There is preseptal soft tissue edema over the left orbit with nearby left frontal scalp  hematoma containing air. No fracture in this region. No intraorbital lesions evident. 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 Mild rightward deviation of nasal septum. Bilateral carotid artery calcification noted. Electronically Signed   By: Lowella Grip III M.D.   On: 12/06/2020 10:20    Microbiology: Recent Results (from the past 240 hour(s))  Resp Panel by RT-PCR (Flu A&B, Covid) Nasopharyngeal Swab     Status: None   Collection Time: 12/19/20  8:15 PM   Specimen: Nasopharyngeal Swab; Nasopharyngeal(NP) swabs in vial transport medium  Result Value Ref Range Status   SARS Coronavirus 2 by RT PCR NEGATIVE NEGATIVE Final    Comment: (NOTE) SARS-CoV-2 target nucleic acids are NOT DETECTED.  The SARS-CoV-2 RNA is generally detectable in upper respiratory specimens during the acute phase of infection. The lowest concentration of SARS-CoV-2 viral  copies this assay can detect is 138 copies/mL. A negative result does not preclude SARS-Cov-2 infection and should not be used as the sole basis for treatment or other patient management decisions. A negative result may occur with  improper specimen collection/handling, submission of specimen other than nasopharyngeal swab, presence of viral mutation(s) within the areas targeted by this assay, and inadequate number of viral copies(<138 copies/mL). A negative result must be combined with clinical observations, patient history, and epidemiological information. The expected result is Negative.  Fact Sheet for Patients:  EntrepreneurPulse.com.au  Fact Sheet for Healthcare Providers:  IncredibleEmployment.be  This test is no t yet approved or cleared by the [place] FDA and  has been authorized for detection and/or diagnosis of SARS-CoV-2 by FDA under an Emergency Use Authorization (EUA). This EUA will remain  in effect (meaning this test can be used)  for the duration of the COVID-19 declaration under Section 564(b)(1) of the Act, 21 U.S.C.section 360bbb-3(b)(1), unless the authorization is terminated  or revoked sooner.       Influenza A by PCR NEGATIVE NEGATIVE Final   Influenza B by PCR NEGATIVE NEGATIVE Final    Comment: (NOTE) The Xpert Xpress SARS-CoV-2/FLU/RSV plus assay is intended as an aid in the diagnosis of influenza from Nasopharyngeal swab specimens and should not be used as a sole basis for treatment. Nasal washings and aspirates are unacceptable for Xpert Xpress SARS-CoV-2/FLU/RSV testing.  Fact Sheet for Patients: EntrepreneurPulse.com.au  Fact Sheet for Healthcare Providers: IncredibleEmployment.be  This test is not yet approved or cleared by the [place] FDA and has been authorized for detection and/or diagnosis of SARS-CoV-2 by FDA under an Emergency Use Authorization (EUA). This EUA will remain in effect (meaning this test can be used) for the duration of the COVID-19 declaration under Section 564(b)(1) of the Act, 21 U.S.C. section 360bbb-3(b)(1), unless the authorization is terminated or revoked.  Performed at [facility], [address] [place], [place] [address]   MRSA PCR Screening     Status: None   Collection Time: 12/20/20  2:12 AM   Specimen: Nasal Mucosa; Nasopharyngeal  Result Value Ref Range Status   MRSA by PCR NEGATIVE NEGATIVE Final    Comment:        The GeneXpert MRSA Assay (FDA approved for NASAL specimens only), is one component of a comprehensive MRSA colonization surveillance program. It is not intended to diagnose MRSA infection nor to guide or monitor treatment for MRSA infections. Performed at [facility], [address] [place],  [address]      Labs: Basic Metabolic Panel: Recent Labs  Lab 12/19/20 2015 12/19/20 2238 12/20/20 0534 12/21/20 0442 12/22/20 0437  NA 141  --  141 138 138  K  4.1  --  3.9 4.2 4.1  CL 109  --  109 110 109  CO2 22  --  22 20* 23  GLUCOSE 135*  --  125* 113* 115*  BUN 18  --  '16 16 17  '$ CREATININE 1.32*  --  1.09* 1.31* 1.34*  CALCIUM 9.5  --  8.6* 8.2* 8.3*  MG  --  1.8  --   --   --    Liver Function Tests: No results for input(s): AST, ALT, ALKPHOS, BILITOT, PROT, ALBUMIN in the last 168 hours. No results for input(s): LIPASE, AMYLASE in the last 168 hours. No results for input(s): AMMONIA in the last 168 hours. CBC: Recent Labs  Lab 12/19/20 2015 12/20/20 0534 12/21/20 0442 12/22/20  0437  WBC 9.4 9.0 9.1 9.3  HGB 12.2 11.5* 9.9* 9.1*  HCT 36.1 34.2* 29.6* 27.5*  MCV 97.0 96.9 98.0 98.9  PLT 284 233 187 158   Cardiac Enzymes: No results for input(s): CKTOTAL, CKMB, CKMBINDEX, TROPONINI in the last 168 hours. BNP: BNP (last 3 results) No results for input(s): BNP in the last 8760 hours.  ProBNP (last 3 results) No results for input(s): PROBNP in the last 8760 hours.  CBG: No results for input(s): GLUCAP in the last 168 hours.     Signed:  Nita Sells MD   [facility] 12/22/2020, 12:44 PM

## 2020-12-23 ENCOUNTER — Encounter: Payer: Self-pay | Admitting: Orthopedic Surgery

## 2020-12-23 NOTE — Anesthesia Postprocedure Evaluation (Signed)
Anesthesia Post Note  Patient: Marie Villa  Procedure(s) Performed: ARTHROPLASTY BIPOLAR HIP (HEMIARTHROPLASTY) (Left Hip)  Patient location during evaluation: Nursing Unit Anesthesia Type: Spinal Level of consciousness: oriented and awake and alert Pain management: pain level controlled Vital Signs Assessment: post-procedure vital signs reviewed and stable Respiratory status: spontaneous breathing, respiratory function stable and patient connected to nasal cannula oxygen Cardiovascular status: blood pressure returned to baseline and stable Postop Assessment: no headache, no backache and no apparent nausea or vomiting Anesthetic complications: no   No complications documented.   Last Vitals:  Vitals:   12/22/20 0809 12/22/20 1616  BP: 134/69 (!) 111/53  Pulse: (!) 104 95  Resp: 16 16  Temp: 37.4 C 37.6 C  SpO2: 94% 94%    Last Pain:  Vitals:   12/22/20 1005  TempSrc:   PainSc: 2                  Arita Miss

## 2020-12-24 ENCOUNTER — Telehealth: Payer: Self-pay

## 2020-12-24 LAB — SURGICAL PATHOLOGY

## 2020-12-24 NOTE — Telephone Encounter (Signed)
Transition Care Management Follow-up Telephone Call  Date of discharge and from where: 12/22/20 [facility]  How have you been since you were released from the hospital? Pt states she is doing a little better, still having soreness with pain of 5/10.  Any questions or concerns? No  Items Reviewed:  Did the pt receive and understand the discharge instructions provided? Yes   Medications obtained and verified? Yes   Other? No   Any new allergies since your discharge? No   Dietary orders reviewed? Yes  Do you have support at home? Yes   Home Care and Equipment/Supplies: Were home health services ordered? yes If so, what is the name of the agency? [facility] up a time to come to the patient's home? no -- pt advised to contact our office tomorrow if they have not heard from home health by end of today  Were any new equipment or medical supplies ordered?  Yes: walker with wheels What is the name of the medical supply agency? [employer] Were you able to get the supplies/equipment? yes Do you have any questions related to the use of the equipment or supplies? No  Functional Questionnaire: (I = Independent and D = Dependent) ADLs: I with assistance  Bathing/Dressing- D  Meal Prep- D  Eating- I  Maintaining continence- I  Transferring/Ambulation- I with assistance  Managing Meds- I with assistance  Follow up appointments reviewed:   PCP Hospital f/u appt confirmed? No  pt recommend to follow up in 1 week for labs with PCP; pt's husband states it is difficulty to get her transported due to torn muscle and limited mobility; please advise for hospital follow up appt and contact patient to schedule. [facility] f/u appt confirmed? No  pt to schedule follow up appt for removal of stitches.  Are transportation arrangements needed? No   If their condition worsens, is the pt aware to call PCP or go to the Emergency Dept.? Yes  Was the patient provided with  contact information for the PCP's office or ED? Yes  Was to pt encouraged to call back with questions or concerns? Yes

## 2020-12-25 ENCOUNTER — Telehealth: Payer: Self-pay

## 2020-12-25 ENCOUNTER — Telehealth: Payer: Self-pay | Admitting: Family Medicine

## 2020-12-25 NOTE — Telephone Encounter (Signed)
Telephone message put in concerning the contact info

## 2020-12-25 NOTE — Telephone Encounter (Signed)
I spoke to [place] at [facility]- pt is to call her back tomorrow at [phone] if they still have not heard from someone about coming to the home. Spoke to [place] and gave him the name and number of contact with [facility].

## 2020-12-25 NOTE — Telephone Encounter (Signed)
Husband states pt was to have PT in the home and they have not heard from anyone. He states he called yesterday and was told to call back this morning if they had not gotten a call. Please advise

## 2020-12-27 DIAGNOSIS — I429 Cardiomyopathy, unspecified: Secondary | ICD-10-CM | POA: Diagnosis not present

## 2020-12-27 DIAGNOSIS — D631 Anemia in chronic kidney disease: Secondary | ICD-10-CM | POA: Diagnosis not present

## 2020-12-27 DIAGNOSIS — K219 Gastro-esophageal reflux disease without esophagitis: Secondary | ICD-10-CM | POA: Diagnosis not present

## 2020-12-27 DIAGNOSIS — N186 End stage renal disease: Secondary | ICD-10-CM | POA: Diagnosis not present

## 2020-12-27 DIAGNOSIS — E785 Hyperlipidemia, unspecified: Secondary | ICD-10-CM | POA: Diagnosis not present

## 2020-12-27 DIAGNOSIS — I132 Hypertensive heart and chronic kidney disease with heart failure and with stage 5 chronic kidney disease, or end stage renal disease: Secondary | ICD-10-CM | POA: Diagnosis not present

## 2020-12-27 DIAGNOSIS — S72002D Fracture of unspecified part of neck of left femur, subsequent encounter for closed fracture with routine healing: Secondary | ICD-10-CM | POA: Diagnosis not present

## 2020-12-27 DIAGNOSIS — I5032 Chronic diastolic (congestive) heart failure: Secondary | ICD-10-CM | POA: Diagnosis not present

## 2020-12-27 DIAGNOSIS — J449 Chronic obstructive pulmonary disease, unspecified: Secondary | ICD-10-CM | POA: Diagnosis not present

## 2020-12-31 DIAGNOSIS — I5032 Chronic diastolic (congestive) heart failure: Secondary | ICD-10-CM | POA: Diagnosis not present

## 2020-12-31 DIAGNOSIS — K219 Gastro-esophageal reflux disease without esophagitis: Secondary | ICD-10-CM | POA: Diagnosis not present

## 2020-12-31 DIAGNOSIS — I132 Hypertensive heart and chronic kidney disease with heart failure and with stage 5 chronic kidney disease, or end stage renal disease: Secondary | ICD-10-CM | POA: Diagnosis not present

## 2020-12-31 DIAGNOSIS — N186 End stage renal disease: Secondary | ICD-10-CM | POA: Diagnosis not present

## 2020-12-31 DIAGNOSIS — I429 Cardiomyopathy, unspecified: Secondary | ICD-10-CM | POA: Diagnosis not present

## 2020-12-31 DIAGNOSIS — S72002D Fracture of unspecified part of neck of left femur, subsequent encounter for closed fracture with routine healing: Secondary | ICD-10-CM | POA: Diagnosis not present

## 2020-12-31 DIAGNOSIS — E785 Hyperlipidemia, unspecified: Secondary | ICD-10-CM | POA: Diagnosis not present

## 2020-12-31 DIAGNOSIS — J449 Chronic obstructive pulmonary disease, unspecified: Secondary | ICD-10-CM | POA: Diagnosis not present

## 2020-12-31 DIAGNOSIS — D631 Anemia in chronic kidney disease: Secondary | ICD-10-CM | POA: Diagnosis not present

## 2021-01-01 DIAGNOSIS — N186 End stage renal disease: Secondary | ICD-10-CM | POA: Diagnosis not present

## 2021-01-01 DIAGNOSIS — S72002D Fracture of unspecified part of neck of left femur, subsequent encounter for closed fracture with routine healing: Secondary | ICD-10-CM | POA: Diagnosis not present

## 2021-01-01 DIAGNOSIS — D631 Anemia in chronic kidney disease: Secondary | ICD-10-CM | POA: Diagnosis not present

## 2021-01-01 DIAGNOSIS — I132 Hypertensive heart and chronic kidney disease with heart failure and with stage 5 chronic kidney disease, or end stage renal disease: Secondary | ICD-10-CM | POA: Diagnosis not present

## 2021-01-01 DIAGNOSIS — E785 Hyperlipidemia, unspecified: Secondary | ICD-10-CM | POA: Diagnosis not present

## 2021-01-01 DIAGNOSIS — K219 Gastro-esophageal reflux disease without esophagitis: Secondary | ICD-10-CM | POA: Diagnosis not present

## 2021-01-01 DIAGNOSIS — I5032 Chronic diastolic (congestive) heart failure: Secondary | ICD-10-CM | POA: Diagnosis not present

## 2021-01-01 DIAGNOSIS — I429 Cardiomyopathy, unspecified: Secondary | ICD-10-CM | POA: Diagnosis not present

## 2021-01-01 DIAGNOSIS — J449 Chronic obstructive pulmonary disease, unspecified: Secondary | ICD-10-CM | POA: Diagnosis not present

## 2021-01-02 DIAGNOSIS — D631 Anemia in chronic kidney disease: Secondary | ICD-10-CM | POA: Diagnosis not present

## 2021-01-02 DIAGNOSIS — K219 Gastro-esophageal reflux disease without esophagitis: Secondary | ICD-10-CM | POA: Diagnosis not present

## 2021-01-02 DIAGNOSIS — I429 Cardiomyopathy, unspecified: Secondary | ICD-10-CM | POA: Diagnosis not present

## 2021-01-02 DIAGNOSIS — J449 Chronic obstructive pulmonary disease, unspecified: Secondary | ICD-10-CM | POA: Diagnosis not present

## 2021-01-02 DIAGNOSIS — S72002D Fracture of unspecified part of neck of left femur, subsequent encounter for closed fracture with routine healing: Secondary | ICD-10-CM | POA: Diagnosis not present

## 2021-01-02 DIAGNOSIS — N186 End stage renal disease: Secondary | ICD-10-CM | POA: Diagnosis not present

## 2021-01-02 DIAGNOSIS — I5032 Chronic diastolic (congestive) heart failure: Secondary | ICD-10-CM | POA: Diagnosis not present

## 2021-01-02 DIAGNOSIS — I132 Hypertensive heart and chronic kidney disease with heart failure and with stage 5 chronic kidney disease, or end stage renal disease: Secondary | ICD-10-CM | POA: Diagnosis not present

## 2021-01-02 DIAGNOSIS — E785 Hyperlipidemia, unspecified: Secondary | ICD-10-CM | POA: Diagnosis not present

## 2021-01-03 DIAGNOSIS — M25552 Pain in left hip: Secondary | ICD-10-CM | POA: Diagnosis not present

## 2021-01-04 DIAGNOSIS — I132 Hypertensive heart and chronic kidney disease with heart failure and with stage 5 chronic kidney disease, or end stage renal disease: Secondary | ICD-10-CM | POA: Diagnosis not present

## 2021-01-04 DIAGNOSIS — E785 Hyperlipidemia, unspecified: Secondary | ICD-10-CM | POA: Diagnosis not present

## 2021-01-04 DIAGNOSIS — J449 Chronic obstructive pulmonary disease, unspecified: Secondary | ICD-10-CM | POA: Diagnosis not present

## 2021-01-04 DIAGNOSIS — I5032 Chronic diastolic (congestive) heart failure: Secondary | ICD-10-CM | POA: Diagnosis not present

## 2021-01-04 DIAGNOSIS — S72002D Fracture of unspecified part of neck of left femur, subsequent encounter for closed fracture with routine healing: Secondary | ICD-10-CM | POA: Diagnosis not present

## 2021-01-04 DIAGNOSIS — I429 Cardiomyopathy, unspecified: Secondary | ICD-10-CM | POA: Diagnosis not present

## 2021-01-04 DIAGNOSIS — K219 Gastro-esophageal reflux disease without esophagitis: Secondary | ICD-10-CM | POA: Diagnosis not present

## 2021-01-04 DIAGNOSIS — N186 End stage renal disease: Secondary | ICD-10-CM | POA: Diagnosis not present

## 2021-01-04 DIAGNOSIS — D631 Anemia in chronic kidney disease: Secondary | ICD-10-CM | POA: Diagnosis not present

## 2021-01-07 DIAGNOSIS — I5032 Chronic diastolic (congestive) heart failure: Secondary | ICD-10-CM | POA: Diagnosis not present

## 2021-01-07 DIAGNOSIS — D631 Anemia in chronic kidney disease: Secondary | ICD-10-CM | POA: Diagnosis not present

## 2021-01-07 DIAGNOSIS — I429 Cardiomyopathy, unspecified: Secondary | ICD-10-CM | POA: Diagnosis not present

## 2021-01-07 DIAGNOSIS — S72002D Fracture of unspecified part of neck of left femur, subsequent encounter for closed fracture with routine healing: Secondary | ICD-10-CM | POA: Diagnosis not present

## 2021-01-07 DIAGNOSIS — K219 Gastro-esophageal reflux disease without esophagitis: Secondary | ICD-10-CM | POA: Diagnosis not present

## 2021-01-07 DIAGNOSIS — J449 Chronic obstructive pulmonary disease, unspecified: Secondary | ICD-10-CM | POA: Diagnosis not present

## 2021-01-07 DIAGNOSIS — I132 Hypertensive heart and chronic kidney disease with heart failure and with stage 5 chronic kidney disease, or end stage renal disease: Secondary | ICD-10-CM | POA: Diagnosis not present

## 2021-01-07 DIAGNOSIS — E785 Hyperlipidemia, unspecified: Secondary | ICD-10-CM | POA: Diagnosis not present

## 2021-01-07 DIAGNOSIS — N186 End stage renal disease: Secondary | ICD-10-CM | POA: Diagnosis not present

## 2021-01-09 DIAGNOSIS — I5032 Chronic diastolic (congestive) heart failure: Secondary | ICD-10-CM | POA: Diagnosis not present

## 2021-01-09 DIAGNOSIS — S72002D Fracture of unspecified part of neck of left femur, subsequent encounter for closed fracture with routine healing: Secondary | ICD-10-CM | POA: Diagnosis not present

## 2021-01-09 DIAGNOSIS — I429 Cardiomyopathy, unspecified: Secondary | ICD-10-CM | POA: Diagnosis not present

## 2021-01-09 DIAGNOSIS — N186 End stage renal disease: Secondary | ICD-10-CM | POA: Diagnosis not present

## 2021-01-09 DIAGNOSIS — D631 Anemia in chronic kidney disease: Secondary | ICD-10-CM | POA: Diagnosis not present

## 2021-01-09 DIAGNOSIS — J449 Chronic obstructive pulmonary disease, unspecified: Secondary | ICD-10-CM | POA: Diagnosis not present

## 2021-01-09 DIAGNOSIS — E785 Hyperlipidemia, unspecified: Secondary | ICD-10-CM | POA: Diagnosis not present

## 2021-01-09 DIAGNOSIS — K219 Gastro-esophageal reflux disease without esophagitis: Secondary | ICD-10-CM | POA: Diagnosis not present

## 2021-01-09 DIAGNOSIS — I132 Hypertensive heart and chronic kidney disease with heart failure and with stage 5 chronic kidney disease, or end stage renal disease: Secondary | ICD-10-CM | POA: Diagnosis not present

## 2021-01-10 DIAGNOSIS — I5032 Chronic diastolic (congestive) heart failure: Secondary | ICD-10-CM | POA: Diagnosis not present

## 2021-01-10 DIAGNOSIS — E785 Hyperlipidemia, unspecified: Secondary | ICD-10-CM | POA: Diagnosis not present

## 2021-01-10 DIAGNOSIS — D631 Anemia in chronic kidney disease: Secondary | ICD-10-CM | POA: Diagnosis not present

## 2021-01-10 DIAGNOSIS — I429 Cardiomyopathy, unspecified: Secondary | ICD-10-CM | POA: Diagnosis not present

## 2021-01-10 DIAGNOSIS — K219 Gastro-esophageal reflux disease without esophagitis: Secondary | ICD-10-CM | POA: Diagnosis not present

## 2021-01-10 DIAGNOSIS — J449 Chronic obstructive pulmonary disease, unspecified: Secondary | ICD-10-CM | POA: Diagnosis not present

## 2021-01-10 DIAGNOSIS — N186 End stage renal disease: Secondary | ICD-10-CM | POA: Diagnosis not present

## 2021-01-10 DIAGNOSIS — S72002D Fracture of unspecified part of neck of left femur, subsequent encounter for closed fracture with routine healing: Secondary | ICD-10-CM | POA: Diagnosis not present

## 2021-01-10 DIAGNOSIS — I132 Hypertensive heart and chronic kidney disease with heart failure and with stage 5 chronic kidney disease, or end stage renal disease: Secondary | ICD-10-CM | POA: Diagnosis not present

## 2021-01-21 DIAGNOSIS — I5032 Chronic diastolic (congestive) heart failure: Secondary | ICD-10-CM | POA: Diagnosis not present

## 2021-01-21 DIAGNOSIS — S72002D Fracture of unspecified part of neck of left femur, subsequent encounter for closed fracture with routine healing: Secondary | ICD-10-CM | POA: Diagnosis not present

## 2021-01-21 DIAGNOSIS — N186 End stage renal disease: Secondary | ICD-10-CM | POA: Diagnosis not present

## 2021-01-21 DIAGNOSIS — I429 Cardiomyopathy, unspecified: Secondary | ICD-10-CM | POA: Diagnosis not present

## 2021-01-21 DIAGNOSIS — J449 Chronic obstructive pulmonary disease, unspecified: Secondary | ICD-10-CM | POA: Diagnosis not present

## 2021-01-21 DIAGNOSIS — I132 Hypertensive heart and chronic kidney disease with heart failure and with stage 5 chronic kidney disease, or end stage renal disease: Secondary | ICD-10-CM | POA: Diagnosis not present

## 2021-01-21 DIAGNOSIS — K219 Gastro-esophageal reflux disease without esophagitis: Secondary | ICD-10-CM | POA: Diagnosis not present

## 2021-01-21 DIAGNOSIS — D631 Anemia in chronic kidney disease: Secondary | ICD-10-CM | POA: Diagnosis not present

## 2021-01-21 DIAGNOSIS — E785 Hyperlipidemia, unspecified: Secondary | ICD-10-CM | POA: Diagnosis not present

## 2021-01-23 DIAGNOSIS — I5032 Chronic diastolic (congestive) heart failure: Secondary | ICD-10-CM | POA: Diagnosis not present

## 2021-01-23 DIAGNOSIS — N186 End stage renal disease: Secondary | ICD-10-CM | POA: Diagnosis not present

## 2021-01-23 DIAGNOSIS — J449 Chronic obstructive pulmonary disease, unspecified: Secondary | ICD-10-CM | POA: Diagnosis not present

## 2021-01-23 DIAGNOSIS — I132 Hypertensive heart and chronic kidney disease with heart failure and with stage 5 chronic kidney disease, or end stage renal disease: Secondary | ICD-10-CM | POA: Diagnosis not present

## 2021-01-23 DIAGNOSIS — K219 Gastro-esophageal reflux disease without esophagitis: Secondary | ICD-10-CM | POA: Diagnosis not present

## 2021-01-23 DIAGNOSIS — I429 Cardiomyopathy, unspecified: Secondary | ICD-10-CM | POA: Diagnosis not present

## 2021-01-23 DIAGNOSIS — E785 Hyperlipidemia, unspecified: Secondary | ICD-10-CM | POA: Diagnosis not present

## 2021-01-23 DIAGNOSIS — D631 Anemia in chronic kidney disease: Secondary | ICD-10-CM | POA: Diagnosis not present

## 2021-01-23 DIAGNOSIS — S72002D Fracture of unspecified part of neck of left femur, subsequent encounter for closed fracture with routine healing: Secondary | ICD-10-CM | POA: Diagnosis not present

## 2021-01-24 DIAGNOSIS — I5032 Chronic diastolic (congestive) heart failure: Secondary | ICD-10-CM | POA: Diagnosis not present

## 2021-01-24 DIAGNOSIS — D631 Anemia in chronic kidney disease: Secondary | ICD-10-CM | POA: Diagnosis not present

## 2021-01-24 DIAGNOSIS — N186 End stage renal disease: Secondary | ICD-10-CM | POA: Diagnosis not present

## 2021-01-24 DIAGNOSIS — E785 Hyperlipidemia, unspecified: Secondary | ICD-10-CM | POA: Diagnosis not present

## 2021-01-24 DIAGNOSIS — I429 Cardiomyopathy, unspecified: Secondary | ICD-10-CM | POA: Diagnosis not present

## 2021-01-24 DIAGNOSIS — S72002D Fracture of unspecified part of neck of left femur, subsequent encounter for closed fracture with routine healing: Secondary | ICD-10-CM | POA: Diagnosis not present

## 2021-01-24 DIAGNOSIS — I132 Hypertensive heart and chronic kidney disease with heart failure and with stage 5 chronic kidney disease, or end stage renal disease: Secondary | ICD-10-CM | POA: Diagnosis not present

## 2021-01-24 DIAGNOSIS — J449 Chronic obstructive pulmonary disease, unspecified: Secondary | ICD-10-CM | POA: Diagnosis not present

## 2021-01-24 DIAGNOSIS — K219 Gastro-esophageal reflux disease without esophagitis: Secondary | ICD-10-CM | POA: Diagnosis not present

## 2021-01-26 DIAGNOSIS — N186 End stage renal disease: Secondary | ICD-10-CM | POA: Diagnosis not present

## 2021-01-26 DIAGNOSIS — I429 Cardiomyopathy, unspecified: Secondary | ICD-10-CM | POA: Diagnosis not present

## 2021-01-26 DIAGNOSIS — I132 Hypertensive heart and chronic kidney disease with heart failure and with stage 5 chronic kidney disease, or end stage renal disease: Secondary | ICD-10-CM | POA: Diagnosis not present

## 2021-01-26 DIAGNOSIS — D631 Anemia in chronic kidney disease: Secondary | ICD-10-CM | POA: Diagnosis not present

## 2021-01-26 DIAGNOSIS — K219 Gastro-esophageal reflux disease without esophagitis: Secondary | ICD-10-CM | POA: Diagnosis not present

## 2021-01-26 DIAGNOSIS — S72002D Fracture of unspecified part of neck of left femur, subsequent encounter for closed fracture with routine healing: Secondary | ICD-10-CM | POA: Diagnosis not present

## 2021-01-26 DIAGNOSIS — J449 Chronic obstructive pulmonary disease, unspecified: Secondary | ICD-10-CM | POA: Diagnosis not present

## 2021-01-26 DIAGNOSIS — E785 Hyperlipidemia, unspecified: Secondary | ICD-10-CM | POA: Diagnosis not present

## 2021-01-26 DIAGNOSIS — I5032 Chronic diastolic (congestive) heart failure: Secondary | ICD-10-CM | POA: Diagnosis not present

## 2021-01-28 DIAGNOSIS — I132 Hypertensive heart and chronic kidney disease with heart failure and with stage 5 chronic kidney disease, or end stage renal disease: Secondary | ICD-10-CM | POA: Diagnosis not present

## 2021-01-28 DIAGNOSIS — K219 Gastro-esophageal reflux disease without esophagitis: Secondary | ICD-10-CM | POA: Diagnosis not present

## 2021-01-28 DIAGNOSIS — I429 Cardiomyopathy, unspecified: Secondary | ICD-10-CM | POA: Diagnosis not present

## 2021-01-28 DIAGNOSIS — J449 Chronic obstructive pulmonary disease, unspecified: Secondary | ICD-10-CM | POA: Diagnosis not present

## 2021-01-28 DIAGNOSIS — E785 Hyperlipidemia, unspecified: Secondary | ICD-10-CM | POA: Diagnosis not present

## 2021-01-28 DIAGNOSIS — I5032 Chronic diastolic (congestive) heart failure: Secondary | ICD-10-CM | POA: Diagnosis not present

## 2021-01-28 DIAGNOSIS — D631 Anemia in chronic kidney disease: Secondary | ICD-10-CM | POA: Diagnosis not present

## 2021-01-28 DIAGNOSIS — N186 End stage renal disease: Secondary | ICD-10-CM | POA: Diagnosis not present

## 2021-01-28 DIAGNOSIS — S72002D Fracture of unspecified part of neck of left femur, subsequent encounter for closed fracture with routine healing: Secondary | ICD-10-CM | POA: Diagnosis not present

## 2021-02-05 DIAGNOSIS — Z96642 Presence of left artificial hip joint: Secondary | ICD-10-CM | POA: Diagnosis not present

## 2021-02-07 DIAGNOSIS — I132 Hypertensive heart and chronic kidney disease with heart failure and with stage 5 chronic kidney disease, or end stage renal disease: Secondary | ICD-10-CM | POA: Diagnosis not present

## 2021-02-07 DIAGNOSIS — J449 Chronic obstructive pulmonary disease, unspecified: Secondary | ICD-10-CM | POA: Diagnosis not present

## 2021-02-07 DIAGNOSIS — E785 Hyperlipidemia, unspecified: Secondary | ICD-10-CM | POA: Diagnosis not present

## 2021-02-07 DIAGNOSIS — I429 Cardiomyopathy, unspecified: Secondary | ICD-10-CM | POA: Diagnosis not present

## 2021-02-07 DIAGNOSIS — N186 End stage renal disease: Secondary | ICD-10-CM | POA: Diagnosis not present

## 2021-02-07 DIAGNOSIS — K219 Gastro-esophageal reflux disease without esophagitis: Secondary | ICD-10-CM | POA: Diagnosis not present

## 2021-02-07 DIAGNOSIS — S72002D Fracture of unspecified part of neck of left femur, subsequent encounter for closed fracture with routine healing: Secondary | ICD-10-CM | POA: Diagnosis not present

## 2021-02-07 DIAGNOSIS — I5032 Chronic diastolic (congestive) heart failure: Secondary | ICD-10-CM | POA: Diagnosis not present

## 2021-02-07 DIAGNOSIS — D631 Anemia in chronic kidney disease: Secondary | ICD-10-CM | POA: Diagnosis not present

## 2021-02-08 DIAGNOSIS — J449 Chronic obstructive pulmonary disease, unspecified: Secondary | ICD-10-CM | POA: Diagnosis not present

## 2021-02-08 DIAGNOSIS — S72002D Fracture of unspecified part of neck of left femur, subsequent encounter for closed fracture with routine healing: Secondary | ICD-10-CM | POA: Diagnosis not present

## 2021-02-08 DIAGNOSIS — N186 End stage renal disease: Secondary | ICD-10-CM | POA: Diagnosis not present

## 2021-02-08 DIAGNOSIS — K219 Gastro-esophageal reflux disease without esophagitis: Secondary | ICD-10-CM | POA: Diagnosis not present

## 2021-02-08 DIAGNOSIS — I5032 Chronic diastolic (congestive) heart failure: Secondary | ICD-10-CM | POA: Diagnosis not present

## 2021-02-08 DIAGNOSIS — D631 Anemia in chronic kidney disease: Secondary | ICD-10-CM | POA: Diagnosis not present

## 2021-02-08 DIAGNOSIS — E785 Hyperlipidemia, unspecified: Secondary | ICD-10-CM | POA: Diagnosis not present

## 2021-02-08 DIAGNOSIS — I132 Hypertensive heart and chronic kidney disease with heart failure and with stage 5 chronic kidney disease, or end stage renal disease: Secondary | ICD-10-CM | POA: Diagnosis not present

## 2021-02-08 DIAGNOSIS — I429 Cardiomyopathy, unspecified: Secondary | ICD-10-CM | POA: Diagnosis not present

## 2021-02-11 DIAGNOSIS — K219 Gastro-esophageal reflux disease without esophagitis: Secondary | ICD-10-CM | POA: Diagnosis not present

## 2021-02-11 DIAGNOSIS — J449 Chronic obstructive pulmonary disease, unspecified: Secondary | ICD-10-CM | POA: Diagnosis not present

## 2021-02-11 DIAGNOSIS — D631 Anemia in chronic kidney disease: Secondary | ICD-10-CM | POA: Diagnosis not present

## 2021-02-11 DIAGNOSIS — N186 End stage renal disease: Secondary | ICD-10-CM | POA: Diagnosis not present

## 2021-02-11 DIAGNOSIS — S72002D Fracture of unspecified part of neck of left femur, subsequent encounter for closed fracture with routine healing: Secondary | ICD-10-CM | POA: Diagnosis not present

## 2021-02-11 DIAGNOSIS — E785 Hyperlipidemia, unspecified: Secondary | ICD-10-CM | POA: Diagnosis not present

## 2021-02-11 DIAGNOSIS — I429 Cardiomyopathy, unspecified: Secondary | ICD-10-CM | POA: Diagnosis not present

## 2021-02-11 DIAGNOSIS — I5032 Chronic diastolic (congestive) heart failure: Secondary | ICD-10-CM | POA: Diagnosis not present

## 2021-02-11 DIAGNOSIS — I132 Hypertensive heart and chronic kidney disease with heart failure and with stage 5 chronic kidney disease, or end stage renal disease: Secondary | ICD-10-CM | POA: Diagnosis not present

## 2021-02-12 ENCOUNTER — Other Ambulatory Visit: Payer: Self-pay

## 2021-02-12 ENCOUNTER — Ambulatory Visit (INDEPENDENT_AMBULATORY_CARE_PROVIDER_SITE_OTHER): Payer: Medicare HMO | Admitting: Family Medicine

## 2021-02-12 ENCOUNTER — Encounter: Payer: Self-pay | Admitting: Family Medicine

## 2021-02-12 VITALS — BP 116/70 | HR 72 | Ht 65.0 in | Wt 132.0 lb

## 2021-02-12 DIAGNOSIS — J0100 Acute maxillary sinusitis, unspecified: Secondary | ICD-10-CM

## 2021-02-12 DIAGNOSIS — J42 Unspecified chronic bronchitis: Secondary | ICD-10-CM | POA: Diagnosis not present

## 2021-02-12 DIAGNOSIS — R059 Cough, unspecified: Secondary | ICD-10-CM | POA: Diagnosis not present

## 2021-02-12 MED ORDER — AZITHROMYCIN 250 MG PO TABS: ORAL_TABLET | ORAL | 0 refills | Status: AC

## 2021-02-12 MED ORDER — GUAIFENESIN-CODEINE 100-10 MG/5ML PO SYRP: 5.0000 mL | ORAL_SOLUTION | Freq: Four times a day (QID) | ORAL | 0 refills | Status: DC | PRN

## 2021-02-12 MED ORDER — PREDNISONE 10 MG PO TABS: 10.0000 mg | ORAL_TABLET | Freq: Every day | ORAL | 0 refills | Status: DC

## 2021-02-12 NOTE — Progress Notes (Signed)
Date:  02/12/2021   Name:  Marie Villa   DOB:  August 13, 1941   MRN:  [id number]   Chief Complaint: Sinusitis (Yellow production, cough at night)  Sinusitis This is a new problem. The current episode started in the past 7 days ([place]). The problem has been gradually worsening since onset. There has been no fever. The pain is mild. Associated symptoms include congestion, coughing, sinus pressure and sneezing. Pertinent negatives include no chills, diaphoresis, ear pain, headaches, hoarse voice, neck pain, shortness of breath, sore throat or swollen glands. Treatments tried: steroid nasal. The treatment provided mild relief.    Lab Results  Component Value Date   CREATININE 1.34 (H) 12/22/2020   BUN 17 12/22/2020   NA 138 12/22/2020   K 4.1 12/22/2020   CL 109 12/22/2020   CO2 23 12/22/2020   Lab Results  Component Value Date   CHOL 184 10/19/2020   HDL 67 10/19/2020   LDLCALC 93 10/19/2020   TRIG 137 10/19/2020   CHOLHDL 3.1 10/12/2017   No results found for: TSH No results found for: HGBA1C Lab Results  Component Value Date   WBC 9.3 12/22/2020   HGB 9.1 (L) 12/22/2020   HCT 27.5 (L) 12/22/2020   MCV 98.9 12/22/2020   PLT 158 12/22/2020   Lab Results  Component Value Date   ALT 20 10/19/2020   AST 22 10/19/2020   ALKPHOS 152 (H) 10/19/2020   BILITOT 0.5 10/19/2020     Review of Systems  Constitutional: Negative for chills and diaphoresis.  HENT: Positive for congestion, sinus pressure and sneezing. Negative for ear pain, hoarse voice and sore throat.   Respiratory: Positive for cough. Negative for shortness of breath.   Musculoskeletal: Negative for neck pain.  Neurological: Negative for headaches.    Patient Active Problem List   Diagnosis Date Noted  . Closed left hip fracture ([place]) 12/19/2020  . Head trauma 12/17/2020  . Visit for suture removal 12/17/2020  . Primary insomnia 10/22/2020  . Left knee pain 11/25/2018  . Primary osteoarthritis of left  knee 11/25/2018  . Essential hypertension 10/12/2017  . COPD (chronic obstructive pulmonary disease) ([place]) 04/10/2017  . Simple chronic bronchitis ([place]) 06/09/2016  . Other seasonal allergic rhinitis 06/09/2016  . Cardiomyopathy ([place]) 06/06/2015  . Chronic kidney disease, stage 3b ([place]) 06/06/2015  . CCF (congestive cardiac failure) ([place]) 06/06/2015  . 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[place]) 06/06/2015  . Gastroesophageal reflux disease 06/06/2015  . Hypercholesteremia 06/06/2015  . Cardiac murmur 12/16/2013  . SOB (shortness of breath) 12/16/2013    Allergies  Allergen Reactions  . Sulfa Antibiotics Hives    Past Surgical History:  Procedure Laterality Date  . CATARACT EXTRACTION, BILATERAL    . COLONOSCOPY  2012   normal- cleared for 10- Iftikhar  . HIP ARTHROPLASTY Left 12/20/2020   Procedure: ARTHROPLASTY BIPOLAR HIP (HEMIARTHROPLASTY);  Surgeon: Leim Fabry, MD;  Location: [facility] ORS;  Service: Orthopedics;  Laterality: Left;  Marland Kitchen VAGINAL HYSTERECTOMY      Social History   Tobacco Use  . Smoking status: Former Smoker    Types: Cigarettes  . Smokeless tobacco: Never Used  . Tobacco comment: quit over 50 years  Vaping Use  . Vaping Use: Never used  Substance Use Topics  . Alcohol use: Yes    Alcohol/week: 1.0 standard drink    Types: 1 Cans of beer per week  . Drug use: Never     Medication list has been reviewed and updated.  Current Meds  Medication Sig  . acetaminophen (TYLENOL) 325 MG tablet Take 325 mg by mouth as needed.  Marland Kitchen amitriptyline (ELAVIL) 10 MG tablet Take 1 tablet (10 mg total) by mouth at bedtime.  Marland Kitchen atorvastatin (LIPITOR) 20 MG tablet Take 1 tablet (20 mg total) by mouth daily.  . Calcium Carb-Cholecalciferol (CALCIUM-VITAMIN D) 600-400 MG-UNIT TABS Take 2 tablets by mouth daily.  . carvedilol (COREG) 3.125 MG tablet Take 1 tablet (3.125 mg total) by mouth 2 (two) times daily. [place]  . cetirizine (ZYRTEC) 10 MG tablet TAKE 1 TABLET(10 MG) BY  MOUTH DAILY  . famotidine (PEPCID) 40 MG tablet Take 1 tablet (40 mg total) by mouth daily.  . ferrous sulfate 324 MG TBEC Take 324 mg by mouth daily.  . fluticasone (FLONASE) 50 MCG/ACT nasal spray SHAKE LIQUID AND USE 1 SPRAY IN EACH NOSTRIL TWICE DAILY  . Fluticasone-Salmeterol (WIXELA INHUB) 250-50 MCG/DOSE AEPB Inhale 1 puff into the lungs 2 (two) times daily.  . Glucos-Chond-Hyal Ac-Ca Fructo (MOVE FREE JOINT HEALTH ADVANCE PO) Take 1 tablet by mouth daily.  Marland Kitchen MEGARED OMEGA-3 KRILL OIL PO Take 1 capsule by mouth daily.  Marland Kitchen omeprazole (PRILOSEC) 20 MG capsule Take 1 capsule (20 mg total) by mouth daily.  . polyethylene glycol (MIRALAX / GLYCOLAX) 17 g packet Take 17 g by mouth daily.  Marland Kitchen senna-docusate (SENOKOT-S) 8.6-50 MG tablet Take 1 tablet by mouth at bedtime as needed for mild constipation.  . vitamin B-12 (CYANOCOBALAMIN) 1000 MCG tablet Take 1,000 mcg by mouth daily.  . [DISCONTINUED] enoxaparin (LOVENOX) 40 MG/0.4ML injection Inject 0.4 mLs (40 mg total) into the skin daily.    PHQ 2/9 Scores 02/12/2021 12/17/2020 11/19/2020 08/07/2020  PHQ - 2 Score 1 0 0 0  PHQ- 9 Score 3 0 - 0    GAD 7 : Generalized Anxiety Score 02/12/2021 12/17/2020 08/07/2020 05/02/2020  Nervous, Anxious, on Edge 1 0 0 0  Control/stop worrying 2 0 0 0  Worry too much - different things 2 0 0 0  Trouble relaxing 0 0 0 0  Restless 0 0 0 0  Easily annoyed or irritable 0 0 0 0  Afraid - awful might happen 1 0 0 0  Total GAD 7 Score 6 0 0 0  Anxiety Difficulty Somewhat difficult - - -    BP Readings from Last 3 Encounters:  02/12/21 116/70  12/22/20 (!) 111/53  12/17/20 108/72    Physical Exam Vitals and nursing note reviewed.  Constitutional:      Appearance: She is well-developed.  HENT:     Head: Normocephalic.     Right Ear: Hearing, tympanic membrane, ear canal and external ear normal.     Left Ear: Hearing, tympanic membrane, ear canal and external ear normal.     Nose: Nose normal.     Right  Turbinates: Not enlarged or swollen.     Left Turbinates: Not enlarged or swollen.     Mouth/Throat:     Lips: Pink.     Mouth: Mucous membranes are moist.     Palate: No mass.     Pharynx: Oropharynx is clear. Uvula midline.  Eyes:     General: Lids are everted, no foreign bodies appreciated. No scleral icterus.       Left eye: No foreign body or hordeolum.     Extraocular Movements: Extraocular movements intact.     Conjunctiva/sclera: Conjunctivae normal.     Right eye: Right conjunctiva is not injected.  Left eye: Left conjunctiva is not injected.     Pupils: Pupils are equal, round, and reactive to light.  Neck:     Thyroid: No thyromegaly.     Vascular: No JVD.     Trachea: No tracheal deviation.  Cardiovascular:     Rate and Rhythm: Normal rate and regular rhythm.     Heart sounds: Normal heart sounds, S1 normal and S2 normal. No murmur heard. No friction rub. No gallop.   Pulmonary:     Effort: Pulmonary effort is normal. No respiratory distress.     Breath sounds: Normal breath sounds. No decreased breath sounds, wheezing, rhonchi or rales.  Abdominal:     General: Bowel sounds are normal.     Palpations: Abdomen is soft. There is no mass.     Tenderness: There is no abdominal tenderness. There is no guarding or rebound.  Musculoskeletal:        General: No tenderness. Normal range of motion.     Cervical back: Normal range of motion and neck supple.  Lymphadenopathy:     Cervical: No cervical adenopathy.  Skin:    General: Skin is warm.     Findings: No rash.  Neurological:     Mental Status: She is alert and oriented to person, place, and time.     Cranial Nerves: No cranial nerve deficit.     Deep Tendon Reflexes: Reflexes normal.  Psychiatric:        Mood and Affect: Mood is not anxious or depressed.     Wt Readings from Last 3 Encounters:  02/12/21 132 lb (59.9 kg)  12/19/20 137 lb 12.6 oz (62.5 kg)  12/17/20 138 lb (62.6 kg)    BP 116/70   Pulse  72   Ht '5\' 5"'$  (1.651 m)   Wt 132 lb (59.9 kg)   BMI 21.97 kg/m   Assessment and Plan:  1. Acute maxillary sinusitis, recurrence not specified New onset.  Persistent.  Stable.  Symptomatology has been going on for about a week with nasal congestion discomfort and exam consistent with tenderness over the maxillary sinuses bilateral.  We will treat with azithromycin to 50 mg 2 today followed by 1 a day for 4 days. - azithromycin (ZITHROMAX) 250 MG tablet; Take 2 tablets on day 1, then 1 tablet daily on days 2 through 5  Dispense: 6 tablet; Refill: 0  2. Cough New onset persistent cough patient responds well to Robitussin-AC 1/2 to 1 teaspoon as needed - guaiFENesin-codeine (ROBITUSSIN AC) 100-10 MG/5ML syrup; Take 5 mLs by mouth 4 (four) times daily as needed for cough.  Dispense: 118 mL; Refill: 0  3. Chronic bronchitis, unspecified chronic bronchitis type ([place]) Baseline COPD which is relatively stable but at this point there is some mild wheezing on exam.  We will use some prednisone once a day for 2 weeks with some on hold. - predniSONE (DELTASONE) 10 MG tablet; Take 1 tablet (10 mg total) by mouth daily with breakfast.  Dispense: 30 tablet; Refill: 0

## 2021-02-13 ENCOUNTER — Encounter: Payer: Self-pay | Admitting: Family Medicine

## 2021-02-18 DIAGNOSIS — I132 Hypertensive heart and chronic kidney disease with heart failure and with stage 5 chronic kidney disease, or end stage renal disease: Secondary | ICD-10-CM | POA: Diagnosis not present

## 2021-02-18 DIAGNOSIS — E785 Hyperlipidemia, unspecified: Secondary | ICD-10-CM | POA: Diagnosis not present

## 2021-02-18 DIAGNOSIS — N186 End stage renal disease: Secondary | ICD-10-CM | POA: Diagnosis not present

## 2021-02-18 DIAGNOSIS — D631 Anemia in chronic kidney disease: Secondary | ICD-10-CM | POA: Diagnosis not present

## 2021-02-18 DIAGNOSIS — S72002D Fracture of unspecified part of neck of left femur, subsequent encounter for closed fracture with routine healing: Secondary | ICD-10-CM | POA: Diagnosis not present

## 2021-02-18 DIAGNOSIS — I5032 Chronic diastolic (congestive) heart failure: Secondary | ICD-10-CM | POA: Diagnosis not present

## 2021-02-18 DIAGNOSIS — I429 Cardiomyopathy, unspecified: Secondary | ICD-10-CM | POA: Diagnosis not present

## 2021-02-18 DIAGNOSIS — K219 Gastro-esophageal reflux disease without esophagitis: Secondary | ICD-10-CM | POA: Diagnosis not present

## 2021-02-18 DIAGNOSIS — J449 Chronic obstructive pulmonary disease, unspecified: Secondary | ICD-10-CM | POA: Diagnosis not present

## 2021-02-20 DIAGNOSIS — M8588 Other specified disorders of bone density and structure, other site: Secondary | ICD-10-CM | POA: Diagnosis not present

## 2021-02-21 DIAGNOSIS — D631 Anemia in chronic kidney disease: Secondary | ICD-10-CM | POA: Diagnosis not present

## 2021-02-21 DIAGNOSIS — I132 Hypertensive heart and chronic kidney disease with heart failure and with stage 5 chronic kidney disease, or end stage renal disease: Secondary | ICD-10-CM | POA: Diagnosis not present

## 2021-02-21 DIAGNOSIS — I429 Cardiomyopathy, unspecified: Secondary | ICD-10-CM | POA: Diagnosis not present

## 2021-02-21 DIAGNOSIS — J449 Chronic obstructive pulmonary disease, unspecified: Secondary | ICD-10-CM | POA: Diagnosis not present

## 2021-02-21 DIAGNOSIS — S72002D Fracture of unspecified part of neck of left femur, subsequent encounter for closed fracture with routine healing: Secondary | ICD-10-CM | POA: Diagnosis not present

## 2021-02-21 DIAGNOSIS — K219 Gastro-esophageal reflux disease without esophagitis: Secondary | ICD-10-CM | POA: Diagnosis not present

## 2021-02-21 DIAGNOSIS — I5032 Chronic diastolic (congestive) heart failure: Secondary | ICD-10-CM | POA: Diagnosis not present

## 2021-02-21 DIAGNOSIS — N186 End stage renal disease: Secondary | ICD-10-CM | POA: Diagnosis not present

## 2021-02-21 DIAGNOSIS — E785 Hyperlipidemia, unspecified: Secondary | ICD-10-CM | POA: Diagnosis not present

## 2021-03-28 DIAGNOSIS — M1711 Unilateral primary osteoarthritis, right knee: Secondary | ICD-10-CM | POA: Diagnosis not present

## 2021-04-08 ENCOUNTER — Other Ambulatory Visit: Payer: Self-pay

## 2021-04-08 ENCOUNTER — Ambulatory Visit (INDEPENDENT_AMBULATORY_CARE_PROVIDER_SITE_OTHER): Payer: Medicare HMO | Admitting: Family Medicine

## 2021-04-08 ENCOUNTER — Encounter: Payer: Self-pay | Admitting: Family Medicine

## 2021-04-08 VITALS — BP 120/78 | HR 59 | Ht 65.0 in | Wt 123.0 lb

## 2021-04-08 DIAGNOSIS — E7800 Pure hypercholesterolemia, unspecified: Secondary | ICD-10-CM

## 2021-04-08 DIAGNOSIS — K219 Gastro-esophageal reflux disease without esophagitis: Secondary | ICD-10-CM | POA: Diagnosis not present

## 2021-04-08 DIAGNOSIS — I10 Essential (primary) hypertension: Secondary | ICD-10-CM | POA: Diagnosis not present

## 2021-04-08 DIAGNOSIS — F5101 Primary insomnia: Secondary | ICD-10-CM

## 2021-04-08 MED ORDER — ATORVASTATIN CALCIUM 20 MG PO TABS: 20.0000 mg | ORAL_TABLET | Freq: Every day | ORAL | 1 refills | Status: DC

## 2021-04-08 MED ORDER — OMEPRAZOLE 20 MG PO CPDR: 20.0000 mg | DELAYED_RELEASE_CAPSULE | Freq: Every day | ORAL | 1 refills | Status: DC

## 2021-04-08 MED ORDER — AMITRIPTYLINE HCL 10 MG PO TABS: 10.0000 mg | ORAL_TABLET | Freq: Every day | ORAL | 1 refills | Status: DC

## 2021-04-08 MED ORDER — CARVEDILOL 3.125 MG PO TABS: 3.1250 mg | ORAL_TABLET | Freq: Two times a day (BID) | ORAL | 1 refills | Status: DC

## 2021-04-08 MED ORDER — FAMOTIDINE 40 MG PO TABS: 40.0000 mg | ORAL_TABLET | Freq: Every day | ORAL | 1 refills | Status: DC

## 2021-04-08 NOTE — Progress Notes (Signed)
Date:  04/08/2021   Name:  Marie Villa   DOB:  August 29, 1941   MRN:  [id number]   Chief Complaint: No chief complaint on file.  Hypertension This is a chronic problem. The current episode started more than 1 year ago. The problem has been gradually improving since onset. The problem is controlled. Pertinent negatives include no anxiety, blurred vision, chest pain, headaches, malaise/fatigue, neck pain, orthopnea, palpitations, peripheral edema, PND, shortness of breath or sweats. There are no associated agents to hypertension. Risk factors for coronary artery disease include dyslipidemia. Past treatments include alpha 1 blockers and beta blockers. The current treatment provides moderate improvement. There are no compliance problems.  There is no history of angina, kidney disease, CAD/MI, CVA, heart failure, left ventricular hypertrophy, PVD or retinopathy. There is no history of chronic renal disease, a hypertension causing med or renovascular disease.  Hyperlipidemia This is a chronic problem. The current episode started more than 1 year ago. The problem is controlled. Recent lipid tests were reviewed and are normal. She has no history of chronic renal disease, diabetes, hypothyroidism, liver disease, obesity or nephrotic syndrome. Pertinent negatives include no chest pain, focal sensory loss, focal weakness, leg pain, myalgias or shortness of breath. Current antihyperlipidemic treatment includes statins. The current treatment provides moderate improvement of lipids. There are no compliance problems.   Gastroesophageal Reflux She reports no abdominal pain, no belching, no chest pain, no choking, no coughing, no dysphagia, no early satiety, no globus sensation, no heartburn, no hoarse voice, no nausea, no sore throat, no stridor, no tooth decay, no water brash or no wheezing. This is a chronic problem. The current episode started more than 1 year ago. The problem occurs rarely. The problem has been  gradually improving. Nothing aggravates the symptoms. Pertinent negatives include no fatigue or melena. She has tried a PPI and a histamine-2 antagonist for the symptoms. The treatment provided moderate relief.  Insomnia Primary symptoms: no fragmented sleep, no sleep disturbance, no difficulty falling asleep, no somnolence, no frequent awakening, no premature morning awakening, no malaise/fatigue, no napping.   The current episode started more than one year. The problem occurs intermittently. The problem has been waxing and waning since onset.   Lab Results  Component Value Date   CREATININE 1.34 (H) 12/22/2020   BUN 17 12/22/2020   NA 138 12/22/2020   K 4.1 12/22/2020   CL 109 12/22/2020   CO2 23 12/22/2020   Lab Results  Component Value Date   CHOL 184 10/19/2020   HDL 67 10/19/2020   LDLCALC 93 10/19/2020   TRIG 137 10/19/2020   CHOLHDL 3.1 10/12/2017   No results found for: TSH No results found for: HGBA1C Lab Results  Component Value Date   WBC 9.3 12/22/2020   HGB 9.1 (L) 12/22/2020   HCT 27.5 (L) 12/22/2020   MCV 98.9 12/22/2020   PLT 158 12/22/2020   Lab Results  Component Value Date   ALT 20 10/19/2020   AST 22 10/19/2020   ALKPHOS 152 (H) 10/19/2020   BILITOT 0.5 10/19/2020     Review of Systems  Constitutional:  Negative for chills, fatigue, fever and malaise/fatigue.  HENT:  Negative for drooling, ear discharge, ear pain, hoarse voice and sore throat.   Eyes:  Negative for blurred vision.  Respiratory:  Negative for cough, choking, shortness of breath and wheezing.   Cardiovascular:  Negative for chest pain, palpitations, orthopnea, leg swelling and PND.  Gastrointestinal:  Negative for abdominal pain,  blood in stool, constipation, diarrhea, dysphagia, heartburn, melena and nausea.  Endocrine: Negative for polydipsia.  Genitourinary:  Negative for dysuria, frequency, hematuria and urgency.  Musculoskeletal:  Negative for back pain, myalgias and neck  pain.  Skin:  Negative for rash.  Allergic/Immunologic: Negative for environmental allergies.  Neurological:  Negative for dizziness, focal weakness and headaches.  Hematological:  Does not bruise/bleed easily.  Psychiatric/Behavioral:  Negative for sleep disturbance and suicidal ideas. The patient has insomnia. The patient is not nervous/anxious.    Patient Active Problem List   Diagnosis Date Noted   Closed left hip fracture ([place]) 12/19/2020   Head trauma 12/17/2020   Visit for suture removal 12/17/2020   Primary insomnia 10/22/2020   Left knee pain 11/25/2018   Primary osteoarthritis of left knee 11/25/2018   Essential hypertension 10/12/2017   COPD (chronic obstructive pulmonary disease) ([place]) 04/10/2017   Simple chronic bronchitis ([place]) 06/09/2016   Other seasonal allergic rhinitis 06/09/2016   Cardiomyopathy ([place]) 06/06/2015   Chronic kidney disease, stage 3b ([place]) 06/06/2015   CCF (congestive cardiac failure) ([place]) 06/06/2015   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[place]) 06/06/2015   Gastroesophageal reflux disease 06/06/2015   Hypercholesteremia 06/06/2015   Cardiac murmur 12/16/2013   SOB (shortness of breath) 12/16/2013    Allergies  Allergen Reactions   Sulfa Antibiotics Hives    Past Surgical History:  Procedure Laterality Date   CATARACT EXTRACTION, BILATERAL     COLONOSCOPY  2012   normal- cleared for 10- Iftikhar   HIP ARTHROPLASTY Left 12/20/2020   Procedure: ARTHROPLASTY BIPOLAR HIP (HEMIARTHROPLASTY);  Surgeon: Leim Fabry, MD;  Location: [facility] ORS;  Service: Orthopedics;  Laterality: Left;   VAGINAL HYSTERECTOMY      Social History   Tobacco Use   Smoking status: Former    Types: Cigarettes   Smokeless tobacco: Never   Tobacco comments:    quit over 50 years  Vaping Use   Vaping Use: Never used  Substance Use Topics   Alcohol use: Yes    Alcohol/week: 1.0 standard drink    Types: 1 Cans of beer per week   Drug use: Never     Medication list  has been reviewed and updated.  No outpatient medications have been marked as taking for the 04/08/21 encounter (Appointment) with Juline Patch, MD.    [facility] 2/9 Scores 02/12/2021 12/17/2020 11/19/2020 08/07/2020  PHQ - 2 Score 1 0 0 0  PHQ- 9 Score 3 0 - 0    GAD 7 : Generalized Anxiety Score 02/12/2021 12/17/2020 08/07/2020 05/02/2020  Nervous, Anxious, on Edge 1 0 0 0  Control/stop worrying 2 0 0 0  Worry too much - different things 2 0 0 0  Trouble relaxing 0 0 0 0  Restless 0 0 0 0  Easily annoyed or irritable 0 0 0 0  Afraid - awful might happen 1 0 0 0  Total GAD 7 Score 6 0 0 0  Anxiety Difficulty Somewhat difficult - - -    BP Readings from Last 3 Encounters:  02/12/21 116/70  12/22/20 (!) 111/53  12/17/20 108/72    Physical Exam Vitals and nursing note reviewed.  Constitutional:      Appearance: She is well-developed.  HENT:     Head: Normocephalic.     Right Ear: Tympanic membrane, ear canal and external ear normal.     Left Ear: Tympanic membrane, ear canal and external ear normal.     Nose: Nose normal.  Eyes:  General: Lids are everted, no foreign bodies appreciated. No scleral icterus.       Left eye: No foreign body or hordeolum.     Conjunctiva/sclera: Conjunctivae normal.     Right eye: Right conjunctiva is not injected.     Left eye: Left conjunctiva is not injected.     Pupils: Pupils are equal, round, and reactive to light.  Neck:     Thyroid: No thyromegaly.     Vascular: No JVD.     Trachea: No tracheal deviation.  Cardiovascular:     Rate and Rhythm: Normal rate and regular rhythm.     Heart sounds: Normal heart sounds. No murmur heard.   No friction rub. No gallop.  Pulmonary:     Effort: Pulmonary effort is normal. No respiratory distress.     Breath sounds: Normal breath sounds. No wheezing or rales.  Abdominal:     General: Bowel sounds are normal.     Palpations: Abdomen is soft. There is no mass.     Tenderness: There is no abdominal  tenderness. There is no guarding or rebound.  Musculoskeletal:        General: No tenderness. Normal range of motion.     Cervical back: Normal range of motion and neck supple.  Lymphadenopathy:     Cervical: No cervical adenopathy.  Skin:    General: Skin is warm.     Findings: No rash.  Neurological:     Mental Status: She is alert and oriented to person, place, and time.     Cranial Nerves: No cranial nerve deficit.     Deep Tendon Reflexes: Reflexes normal.  Psychiatric:        Mood and Affect: Mood is not anxious or depressed.    Wt Readings from Last 3 Encounters:  02/12/21 132 lb (59.9 kg)  12/19/20 137 lb 12.6 oz (62.5 kg)  12/17/20 138 lb (62.6 kg)    There were no vitals taken for this visit.  Assessment and Plan:  1. Essential hypertension Chronic.  Controlled.  Stable.  Blood pressure today 120/78.  Continue carvedilol 3.125 twice a day. - carvedilol (COREG) 3.125 MG tablet; Take 1 tablet (3.125 mg total) by mouth 2 (two) times daily. [place]  Dispense: 180 tablet; Refill: 1  2. Gastroesophageal reflux disease .  Controlled.  Stable.  Patient will be able to control this with her PIP/Prilosec 20 mg in the morning and famotidine 40 mg at night.  Patient is tolerating this well and this is controlling her symptoms currently. - omeprazole (PRILOSEC) 20 MG capsule; Take 1 capsule (20 mg total) by mouth daily.  Dispense: 90 capsule; Refill: 1 - famotidine (PEPCID) 40 MG tablet; Take 1 tablet (40 mg total) by mouth daily.  Dispense: 90 tablet; Refill: 1  3. Hypercholesteremia .  Controlled.  Stable.  Continue atorvastatin 20 mg once a day.  Will check lipid panel for current level of LDL control. - atorvastatin (LIPITOR) 20 MG tablet; Take 1 tablet (20 mg total) by mouth daily.  Dispense: 90 tablet; Refill: 1 - Lipid Panel With LDL/HDL Ratio  4. Primary insomnia Chronic.  Controlled.  Stable.  Continue amitriptyline 10 mg once a day. - amitriptyline (ELAVIL) 10 MG  tablet; Take 1 tablet (10 mg total) by mouth at bedtime.  Dispense: 90 tablet; Refill: 1

## 2021-04-09 LAB — LIPID PANEL WITH LDL/HDL RATIO
Cholesterol, Total: 192 mg/dL (ref 100–199)
HDL: 59 mg/dL (ref >39)
LDL Chol Calc (NIH): 97 mg/dL (ref 0–99)
LDL/HDL Ratio: 1.6 ratio (ref 0.0–3.2)
Triglycerides: 211 mg/dL — ABNORMAL HIGH (ref 0–149)
VLDL Cholesterol Cal: 36 mg/dL (ref 5–40)

## 2021-04-10 DIAGNOSIS — I509 Heart failure, unspecified: Secondary | ICD-10-CM | POA: Diagnosis not present

## 2021-04-10 DIAGNOSIS — K219 Gastro-esophageal reflux disease without esophagitis: Secondary | ICD-10-CM | POA: Diagnosis not present

## 2021-04-10 DIAGNOSIS — N1832 Chronic kidney disease, stage 3b: Secondary | ICD-10-CM | POA: Diagnosis not present

## 2021-04-10 DIAGNOSIS — R011 Cardiac murmur, unspecified: Secondary | ICD-10-CM | POA: Diagnosis not present

## 2021-04-10 DIAGNOSIS — J42 Unspecified chronic bronchitis: Secondary | ICD-10-CM | POA: Diagnosis not present

## 2021-04-10 DIAGNOSIS — R0609 Other forms of dyspnea: Secondary | ICD-10-CM | POA: Diagnosis not present

## 2021-04-10 DIAGNOSIS — I6523 Occlusion and stenosis of bilateral carotid arteries: Secondary | ICD-10-CM | POA: Diagnosis not present

## 2021-04-10 DIAGNOSIS — E7800 Pure hypercholesterolemia, unspecified: Secondary | ICD-10-CM | POA: Diagnosis not present

## 2021-04-10 DIAGNOSIS — I10 Essential (primary) hypertension: Secondary | ICD-10-CM | POA: Diagnosis not present

## 2021-04-16 DIAGNOSIS — Z96642 Presence of left artificial hip joint: Secondary | ICD-10-CM | POA: Diagnosis not present

## 2021-05-17 ENCOUNTER — Other Ambulatory Visit: Payer: Self-pay | Admitting: Family Medicine

## 2021-05-17 DIAGNOSIS — Z1231 Encounter for screening mammogram for malignant neoplasm of breast: Secondary | ICD-10-CM

## 2021-06-07 ENCOUNTER — Ambulatory Visit (INDEPENDENT_AMBULATORY_CARE_PROVIDER_SITE_OTHER): Payer: Medicare HMO

## 2021-06-07 ENCOUNTER — Other Ambulatory Visit: Payer: Self-pay

## 2021-06-07 DIAGNOSIS — Z23 Encounter for immunization: Secondary | ICD-10-CM

## 2021-06-07 NOTE — Addendum Note (Signed)
Addended by: Forbes Cellar on: 06/07/2021 11:28 AM   Modules accepted: Orders

## 2021-06-12 ENCOUNTER — Ambulatory Visit
Admission: RE | Admit: 2021-06-12 | Discharge: 2021-06-12 | Disposition: A | Discharge status: Home / Self Care | Payer: Medicare HMO | Source: Ambulatory Visit | Attending: Family Medicine | Admitting: Family Medicine

## 2021-06-12 ENCOUNTER — Other Ambulatory Visit: Payer: Self-pay

## 2021-06-12 DIAGNOSIS — Z1231 Encounter for screening mammogram for malignant neoplasm of breast: Secondary | ICD-10-CM | POA: Insufficient documentation

## 2021-06-19 ENCOUNTER — Ambulatory Visit
Admission: RE | Admit: 2021-06-19 | Discharge: 2021-06-19 | Disposition: A | Discharge status: Home / Self Care | Payer: Medicare HMO | Source: Ambulatory Visit | Attending: Family Medicine | Admitting: Family Medicine

## 2021-06-19 ENCOUNTER — Other Ambulatory Visit: Payer: Self-pay

## 2021-06-19 DIAGNOSIS — Z1231 Encounter for screening mammogram for malignant neoplasm of breast: Secondary | ICD-10-CM | POA: Diagnosis not present

## 2021-06-20 ENCOUNTER — Other Ambulatory Visit: Payer: Self-pay | Admitting: Family Medicine

## 2021-06-20 DIAGNOSIS — J441 Chronic obstructive pulmonary disease with (acute) exacerbation: Secondary | ICD-10-CM

## 2021-10-09 ENCOUNTER — Ambulatory Visit (INDEPENDENT_AMBULATORY_CARE_PROVIDER_SITE_OTHER): Payer: Medicare HMO | Admitting: Family Medicine

## 2021-10-09 ENCOUNTER — Encounter: Payer: Self-pay | Admitting: Family Medicine

## 2021-10-09 ENCOUNTER — Other Ambulatory Visit: Payer: Self-pay

## 2021-10-09 VITALS — BP 128/60 | HR 80 | Ht 65.0 in | Wt 132.0 lb

## 2021-10-09 DIAGNOSIS — K219 Gastro-esophageal reflux disease without esophagitis: Secondary | ICD-10-CM | POA: Diagnosis not present

## 2021-10-09 DIAGNOSIS — F5101 Primary insomnia: Secondary | ICD-10-CM | POA: Diagnosis not present

## 2021-10-09 DIAGNOSIS — E7800 Pure hypercholesterolemia, unspecified: Secondary | ICD-10-CM | POA: Diagnosis not present

## 2021-10-09 DIAGNOSIS — J302 Other seasonal allergic rhinitis: Secondary | ICD-10-CM | POA: Diagnosis not present

## 2021-10-09 DIAGNOSIS — Z862 Personal history of diseases of the blood and blood-forming organs and certain disorders involving the immune mechanism: Secondary | ICD-10-CM | POA: Diagnosis not present

## 2021-10-09 DIAGNOSIS — I10 Essential (primary) hypertension: Secondary | ICD-10-CM

## 2021-10-09 MED ORDER — CARVEDILOL 3.125 MG PO TABS: 3.1250 mg | ORAL_TABLET | Freq: Two times a day (BID) | ORAL | 1 refills | Status: DC

## 2021-10-09 MED ORDER — OMEPRAZOLE 20 MG PO CPDR: 20.0000 mg | DELAYED_RELEASE_CAPSULE | Freq: Every day | ORAL | 1 refills | Status: DC

## 2021-10-09 MED ORDER — FAMOTIDINE 40 MG PO TABS: 40.0000 mg | ORAL_TABLET | Freq: Every day | ORAL | 1 refills | Status: DC

## 2021-10-09 MED ORDER — ATORVASTATIN CALCIUM 20 MG PO TABS: 20.0000 mg | ORAL_TABLET | Freq: Every day | ORAL | 1 refills | Status: DC

## 2021-10-09 MED ORDER — FLUTICASONE PROPIONATE 50 MCG/ACT NA SUSP: NASAL | 1 refills | Status: DC

## 2021-10-09 MED ORDER — AMITRIPTYLINE HCL 10 MG PO TABS: 10.0000 mg | ORAL_TABLET | Freq: Every day | ORAL | 1 refills | Status: DC

## 2021-10-09 NOTE — Progress Notes (Signed)
Date:  10/09/2021   Name:  Marie Villa   DOB:  1940-12-17   MRN:  [id number]   Chief Complaint: Gastroesophageal Reflux, Allergic Rhinitis , Hypertension, Hyperlipidemia, Anemia, and COPD  Gastroesophageal Reflux She reports no abdominal pain, no belching, no chest pain, no choking, no coughing, no dysphagia, no early satiety, no globus sensation, no heartburn, no hoarse voice, no nausea, no sore throat, no stridor or no wheezing. This is a chronic problem. The current episode started more than 1 year ago. The problem has been gradually improving. The symptoms are aggravated by certain foods. Pertinent negatives include no anemia, fatigue, melena, muscle weakness, orthopnea or weight loss. There are no known risk factors. She has tried a histamine-2 antagonist and a PPI for the symptoms. The treatment provided significant relief.  Hypertension This is a chronic problem. The current episode started more than 1 year ago. The problem has been gradually improving since onset. The problem is controlled. Pertinent negatives include no anxiety, blurred vision, chest pain, headaches, malaise/fatigue, neck pain, orthopnea, palpitations, peripheral edema, PND, shortness of breath or sweats. There are no associated agents to hypertension. Past treatments include alpha 1 blockers and beta blockers. The current treatment provides moderate improvement. There are no compliance problems.   Hyperlipidemia This is a chronic problem. The current episode started more than 1 year ago. The problem is controlled. Recent lipid tests were reviewed and are normal. Pertinent negatives include no chest pain, myalgias or shortness of breath. Current antihyperlipidemic treatment includes statins.  Anemia Presents for follow-up visit. There has been no abdominal pain, bruising/bleeding easily, fever, malaise/fatigue, palpitations or weight loss. Signs of blood loss that are not present include hematemesis, hematochezia, melena,  menorrhagia and vaginal bleeding.  COPD There is no chest tightness, cough, difficulty breathing, frequent throat clearing, hemoptysis, hoarse voice, shortness of breath, sputum production or wheezing. This is a chronic problem. The problem occurs intermittently. Pertinent negatives include no chest pain, ear pain, fever, headaches, heartburn, malaise/fatigue, myalgias, nasal congestion, orthopnea, PND, postnasal drip, sore throat, sweats or weight loss. Her past medical history is significant for COPD.   Lab Results  Component Value Date   NA 138 12/22/2020   K 4.1 12/22/2020   CO2 23 12/22/2020   GLUCOSE 115 (H) 12/22/2020   BUN 17 12/22/2020   CREATININE 1.34 (H) 12/22/2020   CALCIUM 8.3 (L) 12/22/2020   GFRNONAA 40 (L) 12/22/2020   Lab Results  Component Value Date   CHOL 192 04/08/2021   HDL 59 04/08/2021   LDLCALC 97 04/08/2021   TRIG 211 (H) 04/08/2021   CHOLHDL 3.1 10/12/2017   No results found for: TSH No results found for: HGBA1C Lab Results  Component Value Date   WBC 9.3 12/22/2020   HGB 9.1 (L) 12/22/2020   HCT 27.5 (L) 12/22/2020   MCV 98.9 12/22/2020   PLT 158 12/22/2020   Lab Results  Component Value Date   ALT 20 10/19/2020   AST 22 10/19/2020   ALKPHOS 152 (H) 10/19/2020   BILITOT 0.5 10/19/2020   No results found for: 25OHVITD2, 25OHVITD3, VD25OH   Review of Systems  Constitutional:  Negative for chills, fatigue, fever, malaise/fatigue and weight loss.  HENT:  Negative for drooling, ear discharge, ear pain, hoarse voice, postnasal drip and sore throat.   Eyes:  Negative for blurred vision.  Respiratory:  Negative for cough, hemoptysis, sputum production, choking, shortness of breath and wheezing.   Cardiovascular:  Negative for chest pain, palpitations, orthopnea,  leg swelling and PND.  Gastrointestinal:  Negative for abdominal pain, blood in stool, constipation, diarrhea, dysphagia, heartburn, hematemesis, hematochezia, melena and nausea.   Endocrine: Negative for polydipsia.  Genitourinary:  Negative for dysuria, frequency, hematuria, menorrhagia, urgency and vaginal bleeding.  Musculoskeletal:  Negative for back pain, myalgias, muscle weakness and neck pain.  Skin:  Negative for rash.  Allergic/Immunologic: Negative for environmental allergies.  Neurological:  Negative for dizziness and headaches.  Hematological:  Does not bruise/bleed easily.  Psychiatric/Behavioral:  Negative for suicidal ideas. The patient is not nervous/anxious.    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) 06/06/2015   Gastroesophageal reflux disease 06/06/2015   Hypercholesteremia 06/06/2015   Cardiac murmur 12/16/2013   SOB (shortness of breath) 12/16/2013    Allergies  Allergen Reactions   Sulfa Antibiotics Hives    Past Surgical History:  Procedure Laterality Date   CATARACT EXTRACTION, BILATERAL     COLONOSCOPY  2012   normal- cleared for 10- [facility]   HIP ARTHROPLASTY Left 12/20/2020   Procedure: ARTHROPLASTY BIPOLAR HIP (HEMIARTHROPLASTY);  Surgeon: Leim Fabry, MD;  Location: [facility] ORS;  Service: Orthopedics;  Laterality: Left;   VAGINAL HYSTERECTOMY      Social History   Tobacco Use   Smoking status: Former    Types: Cigarettes   Smokeless tobacco: Never   Tobacco comments:    quit over 50 years  Vaping Use   Vaping Use: Never used   Substance Use Topics   Alcohol use: Yes    Alcohol/week: 1.0 standard drink    Types: 1 Cans of beer per week   Drug use: Never     Medication list has been reviewed and updated.  Current Meds  Medication Sig   acetaminophen (TYLENOL) 325 MG tablet Take 325 mg by mouth as needed.   amitriptyline (ELAVIL) 10 MG tablet Take 1 tablet (10 mg total) by mouth at bedtime.   atorvastatin (LIPITOR) 20 MG tablet Take 1 tablet (20 mg total) by mouth daily.   Calcium Carb-Cholecalciferol (CALCIUM-VITAMIN D) 600-400 MG-UNIT TABS Take 2 tablets by mouth daily.   carvedilol (COREG) 3.125 MG tablet Take 1 tablet (3.125 mg total) by mouth 2 (two) times daily. [place]   cetirizine (ZYRTEC) 10 MG tablet TAKE 1 TABLET(10 MG) BY MOUTH DAILY   famotidine (PEPCID) 40 MG tablet Take 1 tablet (40 mg total) by mouth daily.   ferrous sulfate 324 MG TBEC Take 324 mg by mouth daily.   fluticasone (FLONASE) 50 MCG/ACT nasal spray SHAKE LIQUID AND USE 1 SPRAY IN EACH NOSTRIL TWICE DAILY   Glucos-Chond-Hyal Ac-Ca Fructo (MOVE FREE JOINT HEALTH ADVANCE PO) Take 1 tablet by mouth daily.   MEGARED OMEGA-3 KRILL OIL PO Take 1 capsule by mouth daily.   omeprazole (PRILOSEC) 20 MG capsule Take 1 capsule (20 mg total) by mouth daily.   polyethylene glycol (MIRALAX / GLYCOLAX) 17 g packet Take 17 g by mouth daily.  ramipril (ALTACE) 2.5 MG capsule Take 2.5 mg by mouth daily.   senna-docusate (SENOKOT-S) 8.6-50 MG tablet Take 1 tablet by mouth at bedtime as needed for mild constipation.   vitamin B-12 (CYANOCOBALAMIN) 1000 MCG tablet Take 1,000 mcg by mouth daily.   WIXELA INHUB 250-50 MCG/ACT AEPB INHALE 1 PUFF INTO THE LUNGS TWICE DAILY   Zinc Sulfate (ZINC 15 PO) Take 1 tablet by mouth daily.   [DISCONTINUED] oxyCODONE (OXY IR/ROXICODONE) 5 MG immediate release tablet Take 0.5-1 tablets (2.5-5 mg total) by mouth every 4 (four) hours as needed for moderate pain (pain score 4-6).    PHQ 2/9 Scores 10/09/2021 04/08/2021  02/12/2021 12/17/2020  PHQ - 2 Score 0 2 1 0  PHQ- 9 Score 0 4 3 0    GAD 7 : Generalized Anxiety Score 10/09/2021 04/08/2021 02/12/2021 12/17/2020  Nervous, Anxious, on Edge 0 0 1 0  Control/stop worrying 0 0 2 0  Worry too much - different things 0 0 2 0  Trouble relaxing 0 0 0 0  Restless 0 0 0 0  Easily annoyed or irritable 0 0 0 0  Afraid - awful might happen 0 0 1 0  Total GAD 7 Score 0 0 6 0  Anxiety Difficulty Not difficult at all Not difficult at all Somewhat difficult -    BP Readings from Last 3 Encounters:  10/09/21 128/60  04/08/21 120/78  02/12/21 116/70    Physical Exam Vitals and nursing note reviewed. Exam conducted with a chaperone present.  Constitutional:      General: She is not in acute distress.    Appearance: She is not diaphoretic.  HENT:     Head: Normocephalic and atraumatic.     Right Ear: Tympanic membrane and external ear normal. There is no impacted cerumen.     Left Ear: Tympanic membrane and external ear normal. There is no impacted cerumen.     Nose: Nose normal. No congestion or rhinorrhea.  Eyes:     General:        Right eye: No discharge.        Left eye: No discharge.     Conjunctiva/sclera: Conjunctivae normal.     Pupils: Pupils are equal, round, and reactive to light.  Neck:     Thyroid: No thyromegaly.     Vascular: No JVD.  Cardiovascular:     Rate and Rhythm: Normal rate and regular rhythm.     Heart sounds: Normal heart sounds, S1 normal and S2 normal. No murmur heard. No systolic murmur is present.  No diastolic murmur is present.    No friction rub. No gallop. No S3 or S4 sounds.  Pulmonary:     Effort: Pulmonary effort is normal.     Breath sounds: Normal breath sounds. No wheezing or rhonchi.  Abdominal:     General: Bowel sounds are normal.     Palpations: Abdomen is soft. There is no mass.     Tenderness: There is no abdominal tenderness. There is no guarding.  Musculoskeletal:        General: Normal range of motion.      Cervical back: Normal range of motion and neck supple.     Right lower leg: No edema.     Left lower leg: No edema.  Lymphadenopathy:     Cervical: No cervical adenopathy.  Skin:    General: Skin is warm and dry.  Neurological:     Mental Status: She is alert.    Wt  Readings from Last 3 Encounters:  10/09/21 132 lb (59.9 kg)  04/08/21 123 lb (55.8 kg)  02/12/21 132 lb (59.9 kg)    BP 128/60    Pulse 80    Ht 5\' 5"  (1.651 m)    Wt 132 lb (59.9 kg)    BMI 21.97 kg/m   Assessment and Plan:  1. Primary insomnia Chronic.  Controlled.  Stable.  Patient has results for her insomnia with amitriptyline 10 mg nightly. - amitriptyline (ELAVIL) 10 MG tablet; Take 1 tablet (10 mg total) by mouth at bedtime.  Dispense: 90 tablet; Refill: 1  2. Essential hypertension Chronic.  Controlled.  Stable.  Blood pressure 128/60.  Continue carvedilol 3.1251 tablet twice a day.  Will check CMP for current electrolytes and GFR levels. - carvedilol (COREG) 3.125 MG tablet; Take 1 tablet (3.125 mg total) by mouth 2 (two) times daily. [place]  Dispense: 180 tablet; Refill: 1 - Comprehensive Metabolic Panel (CMET)  3. Hypercholesteremia Chronic.  Controlled.  Stable.  Continue atorvastatin 20 mg once a day. - atorvastatin (LIPITOR) 20 MG tablet; Take 1 tablet (20 mg total) by mouth daily.  Dispense: 90 tablet; Refill: 1  4. Gastroesophageal reflux disease Complicated in that patient is requiring Prilosec 20 mg q. a.m. with Pepcid to be taken at night. - famotidine (PEPCID) 40 MG tablet; Take 1 tablet (40 mg total) by mouth daily.  Dispense: 90 tablet; Refill: 1 - omeprazole (PRILOSEC) 20 MG capsule; Take 1 capsule (20 mg total) by mouth daily.  Dispense: 90 capsule; Refill: 1  5. Other seasonal allergic rhinitis Chronic.  Controlled.  Stable.  Continue Flonase nasal spray 1 spray each nostril twice a day - fluticasone (FLONASE) 50 MCG/ACT nasal spray; SHAKE LIQUID AND USE 1 SPRAY IN EACH NOSTRIL TWICE  DAILY  Dispense: 16 g; Refill: 1  6. History of anemia Patient has history of anemia which is iron deficiency in nature we will continue on current iron supplementation we will check CBC to see the results thereof. - CBC w/Diff/Platelet

## 2021-10-10 LAB — CBC WITH DIFFERENTIAL/PLATELET
Basophils Absolute: 0.1 10*3/uL (ref 0.0–0.2)
Basos: 1 %
EOS (ABSOLUTE): 1 10*3/uL — ABNORMAL HIGH (ref 0.0–0.4)
Eos: 12 %
Hematocrit: 37.1 % (ref 34.0–46.6)
Hemoglobin: 12.7 g/dL (ref 11.1–15.9)
Immature Grans (Abs): 0 10*3/uL (ref 0.0–0.1)
Immature Granulocytes: 0 %
Lymphocytes Absolute: 1.8 10*3/uL (ref 0.7–3.1)
Lymphs: 23 %
MCH: 32.6 pg (ref 26.6–33.0)
MCHC: 34.2 g/dL (ref 31.5–35.7)
MCV: 95 fL (ref 79–97)
Monocytes Absolute: 0.7 10*3/uL (ref 0.1–0.9)
Monocytes: 9 %
Neutrophils Absolute: 4.2 10*3/uL (ref 1.4–7.0)
Neutrophils: 55 %
Platelets: 281 10*3/uL (ref 150–450)
RBC: 3.89 x10E6/uL (ref 3.77–5.28)
RDW: 12.1 % (ref 11.7–15.4)
WBC: 7.7 10*3/uL (ref 3.4–10.8)

## 2021-10-10 LAB — COMPREHENSIVE METABOLIC PANEL WITH GFR
ALT: 17 IU/L (ref 0–32)
AST: 21 IU/L (ref 0–40)
Albumin/Globulin Ratio: 1.7 (ref 1.2–2.2)
Albumin: 4.5 g/dL (ref 3.6–4.6)
Alkaline Phosphatase: 147 IU/L — ABNORMAL HIGH (ref 44–121)
BUN/Creatinine Ratio: 10 — ABNORMAL LOW (ref 12–28)
BUN: 14 mg/dL (ref 8–27)
Bilirubin Total: 0.3 mg/dL (ref 0.0–1.2)
CO2: 25 mmol/L (ref 20–29)
Calcium: 9.5 mg/dL (ref 8.7–10.3)
Chloride: 100 mmol/L (ref 96–106)
Creatinine, Ser: 1.47 mg/dL — ABNORMAL HIGH (ref 0.57–1.00)
Globulin, Total: 2.6 g/dL (ref 1.5–4.5)
Glucose: 97 mg/dL (ref 70–99)
Potassium: 4.6 mmol/L (ref 3.5–5.2)
Sodium: 140 mmol/L (ref 134–144)
Total Protein: 7.1 g/dL (ref 6.0–8.5)
eGFR: 35 mL/min/1.73 — ABNORMAL LOW

## 2021-11-20 ENCOUNTER — Ambulatory Visit: Payer: Medicare HMO

## 2021-12-09 ENCOUNTER — Ambulatory Visit (INDEPENDENT_AMBULATORY_CARE_PROVIDER_SITE_OTHER): Payer: Medicare HMO

## 2021-12-09 VITALS — BP 128/84 | HR 84 | Resp 16 | Ht 65.0 in | Wt 134.8 lb

## 2021-12-09 DIAGNOSIS — Z0000 Encounter for general adult medical examination without abnormal findings: Secondary | ICD-10-CM | POA: Diagnosis not present

## 2021-12-09 NOTE — Patient Instructions (Signed)
Marie Villa , ?Thank you for taking time to come for your Medicare Wellness Visit. I appreciate your ongoing commitment to your health goals. Please review the following plan we discussed and let me know if I can assist you in the future.  ? ?Screening recommendations/referrals: ?Colonoscopy: no longer required ?Mammogram: done 06/19/21 ?Bone Density: no longer required ?Recommended yearly ophthalmology/optometry visit for glaucoma screening and checkup ?Recommended yearly dental visit for hygiene and checkup ? ?Vaccinations: ?Influenza vaccine: done 06/07/21 ?Pneumococcal vaccine: done 06/07/21 ?Tdap vaccine: done 12/06/20 ?Shingles vaccine: done 06/02/18 & 08/15/18   ?Covid-19:done 09/20/19, 10/19/19 & 04/08/20 ? ?Advanced directives: Please bring a copy of your health care power of attorney and living will to the office at your convenience.  ? ?Conditions/risks identified: Recommend drinking 6-8 glasses of water per day  ? ?Next appointment: Follow up in one year for your annual wellness visit  ? ? ?Preventive Care 32 Years and Older, Female ?Preventive care refers to lifestyle choices and visits with your health care provider that can promote health and wellness. ?What does preventive care include? ?A yearly physical exam. This is also called an annual well check. ?Dental exams once or twice a year. ?Routine eye exams. Ask your health care provider how often you should have your eyes checked. ?Personal lifestyle choices, including: ?Daily care of your teeth and gums. ?Regular physical activity. ?Eating a healthy diet. ?Avoiding tobacco and drug use. ?Limiting alcohol use. ?Practicing safe sex. ?Taking low-dose aspirin every day. ?Taking vitamin and mineral supplements as recommended by your health care provider. ?What happens during an annual well check? ?The services and screenings done by your health care provider during your annual well check will depend on your age, overall health, lifestyle risk factors, and family  history of disease. ?Counseling  ?Your health care provider may ask you questions about your: ?Alcohol use. ?Tobacco use. ?Drug use. ?Emotional well-being. ?Home and relationship well-being. ?Sexual activity. ?Eating habits. ?History of falls. ?Memory and ability to understand (cognition). ?Work and work Statistician. ?Reproductive health. ?Screening  ?You may have the following tests or measurements: ?Height, weight, and BMI. ?Blood pressure. ?Lipid and cholesterol levels. These may be checked every 5 years, or more frequently if you are over 13 years old. ?Skin check. ?Lung cancer screening. You may have this screening every year starting at age 6 if you have a 30-pack-year history of smoking and currently smoke or have quit within the past 15 years. ?Fecal occult blood test (FOBT) of the stool. You may have this test every year starting at age 58. ?Flexible sigmoidoscopy or colonoscopy. You may have a sigmoidoscopy every 5 years or a colonoscopy every 10 years starting at age 36. ?Hepatitis C blood test. ?Hepatitis B blood test. ?Sexually transmitted disease (STD) testing. ?Diabetes screening. This is done by checking your blood sugar (glucose) after you have not eaten for a while (fasting). You may have this done every 1-3 years. ?Bone density scan. This is done to screen for osteoporosis. You may have this done starting at age 55. ?Mammogram. This may be done every 1-2 years. Talk to your health care provider about how often you should have regular mammograms. ?Talk with your health care provider about your test results, treatment options, and if necessary, the need for more tests. ?Vaccines  ?Your health care provider may recommend certain vaccines, such as: ?Influenza vaccine. This is recommended every year. ?Tetanus, diphtheria, and acellular pertussis (Tdap, Td) vaccine. You may need a Td booster every 10 years. ?Zoster  vaccine. You may need this after age 59. ?Pneumococcal 13-valent conjugate (PCV13)  vaccine. One dose is recommended after age 20. ?Pneumococcal polysaccharide (PPSV23) vaccine. One dose is recommended after age 90. ?Talk to your health care provider about which screenings and vaccines you need and how often you need them. ?This information is not intended to replace advice given to you by your health care provider. Make sure you discuss any questions you have with your health care provider. ?Document Released: 09/21/2015 Document Revised: 05/14/2016 Document Reviewed: 06/26/2015 ?Elsevier Interactive Patient Education ? 2017 [employer]. ? ?Fall Prevention in the Home ?Falls can cause injuries. They can happen to people of all ages. There are many things you can do to make your home safe and to help prevent falls. ?What can I do on the outside of my home? ?Regularly fix the edges of walkways and driveways and fix any cracks. ?Remove anything that might make you trip as you walk through a door, such as a raised step or threshold. ?Trim any bushes or trees on the path to your home. ?Use bright outdoor lighting. ?Clear any walking paths of anything that might make someone trip, such as rocks or tools. ?Regularly check to see if handrails are loose or broken. Make sure that both sides of any steps have handrails. ?Any raised decks and porches should have guardrails on the edges. ?Have any leaves, snow, or ice cleared regularly. ?Use sand or salt on walking paths during winter. ?Clean up any spills in your garage right away. This includes oil or grease spills. ?What can I do in the bathroom? ?Use night lights. ?Install grab bars by the toilet and in the tub and shower. Do not use towel bars as grab bars. ?Use non-skid mats or decals in the tub or shower. ?If you need to sit down in the shower, use a plastic, non-slip stool. ?Keep the floor dry. Clean up any water that spills on the floor as soon as it happens. ?Remove soap buildup in the tub or shower regularly. ?Attach bath mats securely with  double-sided non-slip rug tape. ?Do not have throw rugs and other things on the floor that can make you trip. ?What can I do in the bedroom? ?Use night lights. ?Make sure that you have a light by your bed that is easy to reach. ?Do not use any sheets or blankets that are too big for your bed. They should not hang down onto the floor. ?Have a firm chair that has side arms. You can use this for support while you get dressed. ?Do not have throw rugs and other things on the floor that can make you trip. ?What can I do in the kitchen? ?Clean up any spills right away. ?Avoid walking on wet floors. ?Keep items that you use a lot in easy-to-reach places. ?If you need to reach something above you, use a strong step stool that has a grab bar. ?Keep electrical cords out of the way. ?Do not use floor polish or wax that makes floors slippery. If you must use wax, use non-skid floor wax. ?Do not have throw rugs and other things on the floor that can make you trip. ?What can I do with my stairs? ?Do not leave any items on the stairs. ?Make sure that there are handrails on both sides of the stairs and use them. Fix handrails that are broken or loose. Make sure that handrails are as long as the stairways. ?Check any carpeting to make sure that it  is firmly attached to the stairs. Fix any carpet that is loose or worn. ?Avoid having throw rugs at the top or bottom of the stairs. If you do have throw rugs, attach them to the floor with carpet tape. ?Make sure that you have a light switch at the top of the stairs and the bottom of the stairs. If you do not have them, ask someone to add them for you. ?What else can I do to help prevent falls? ?Wear shoes that: ?Do not have high heels. ?Have rubber bottoms. ?Are comfortable and fit you well. ?Are closed at the toe. Do not wear sandals. ?If you use a stepladder: ?Make sure that it is fully opened. Do not climb a closed stepladder. ?Make sure that both sides of the stepladder are locked  into place. ?Ask someone to hold it for you, if possible. ?Clearly mark and make sure that you can see: ?Any grab bars or handrails. ?First and last steps. ?Where the edge of each step is. ?Use tools that help you mo

## 2021-12-09 NOTE — Progress Notes (Signed)
? ?Subjective:  ? Marie Villa is a 81 y.o. female who presents for Medicare Annual (Subsequent) preventive examination. ? ?Review of Systems    ? ?Cardiac Risk Factors include: advanced age (>57men, >9 women);dyslipidemia;hypertension ? ?   ?Objective:  ?  ?Today's Vitals  ? 12/09/21 1103  ?BP: 128/84  ?Pulse: 84  ?Resp: 16  ?SpO2: 97%  ?Weight: 134 lb 12.8 oz (61.1 kg)  ?Height: 5\' 5"  (1.651 m)  ? ?Body mass index is 22.43 kg/m?. ? ? ?  12/09/2021  ? 11:15 AM 12/20/2020  ?  2:00 AM 12/06/2020  ?  9:30 AM 11/19/2020  ?  9:36 AM 11/16/2019  ?  9:29 AM 06/06/2015  ?  8:37 AM  ?Advanced Directives  ?Does Patient Have a Medical Advance Directive? Yes Yes No Yes Yes Yes  ?Type of Paramedic of [place];Living will Living will  [place];Living will [place];Living will Living will;Healthcare Power of Attorney  ?Does patient want to make changes to medical advance directive?  No - Guardian declined      ?Copy of [place] in Chart? No - copy requested   No - copy requested No - copy requested No - copy requested  ?Would patient like information on creating a medical advance directive?  No - Patient declined No - Patient declined     ? ? ?Current Medications (verified) ?Outpatient Encounter Medications as of 12/09/2021  ?Medication Sig  ? acetaminophen (TYLENOL) 325 MG tablet Take 325 mg by mouth as needed.  ? amitriptyline (ELAVIL) 10 MG tablet Take 1 tablet (10 mg total) by mouth at bedtime.  ? atorvastatin (LIPITOR) 20 MG tablet Take 1 tablet (20 mg total) by mouth daily.  ? Calcium Carb-Cholecalciferol (CALCIUM-VITAMIN D) 600-400 MG-UNIT TABS Take 2 tablets by mouth daily.  ? carvedilol (COREG) 3.125 MG tablet Take 1 tablet (3.125 mg total) by mouth 2 (two) times daily. [place]  ? famotidine (PEPCID) 40 MG tablet Take 1 tablet (40 mg total) by mouth daily.  ? ferrous sulfate 324 MG TBEC Take 324 mg by mouth daily.  ? fexofenadine (ALLEGRA)  180 MG tablet Take 180 mg by mouth daily.  ? fluticasone (FLONASE) 50 MCG/ACT nasal spray SHAKE LIQUID AND USE 1 SPRAY IN EACH NOSTRIL TWICE DAILY  ? Glucos-Chond-Hyal Ac-Ca Fructo (MOVE FREE JOINT HEALTH ADVANCE PO) Take 1 tablet by mouth daily.  ? omeprazole (PRILOSEC) 20 MG capsule Take 1 capsule (20 mg total) by mouth daily.  ? polyethylene glycol (MIRALAX / GLYCOLAX) 17 g packet Take 17 g by mouth daily.  ? ramipril (ALTACE) 2.5 MG capsule Take 2.5 mg by mouth daily.  ? senna-docusate (SENOKOT-S) 8.6-50 MG tablet Take 1 tablet by mouth at bedtime as needed for mild constipation.  ? traZODone (DESYREL) 50 MG tablet Take 50 mg by mouth at bedtime as needed for sleep. Dr. Clayborn Bigness  ? vitamin B-12 (CYANOCOBALAMIN) 1000 MCG tablet Take 1,000 mcg by mouth daily.  ? WIXELA INHUB 250-50 MCG/ACT AEPB INHALE 1 PUFF INTO THE LUNGS TWICE DAILY  ? Zinc Sulfate (ZINC 15 PO) Take 1 tablet by mouth daily.  ? cetirizine (ZYRTEC) 10 MG tablet TAKE 1 TABLET(10 MG) BY MOUTH DAILY (Patient not taking: Reported on 12/09/2021)  ? [DISCONTINUED] MEGARED OMEGA-3 KRILL OIL PO Take 1 capsule by mouth daily.  ? [DISCONTINUED] traZODone (DESYREL) 50 MG tablet Take 1 tablet by mouth at bedtime as needed for sleep.  ? ?No facility-administered encounter medications  on file as of 12/09/2021.  ? ? ?Allergies (verified) ?Sulfa antibiotics  ? ?History: ?Past Medical History:  ?Diagnosis Date  ? Congestive heart disease ([place])   ? COPD (chronic obstructive pulmonary disease) ([place])   ? GERD (gastroesophageal reflux disease)   ? Hyperlipidemia   ? Hypertension   ? ?Past Surgical History:  ?Procedure Laterality Date  ? CATARACT EXTRACTION, BILATERAL    ? COLONOSCOPY  2012  ? normal- cleared for 10- Iftikhar  ? HIP ARTHROPLASTY Left 12/20/2020  ? Procedure: ARTHROPLASTY BIPOLAR HIP (HEMIARTHROPLASTY);  Surgeon: Leim Fabry, MD;  Location: [facility] ORS;  Service: Orthopedics;  Laterality: Left;  ? VAGINAL HYSTERECTOMY    ? ?Family History  ?Problem Relation  Age of Onset  ? Breast cancer Neg Hx   ? ?Social History  ? ?Socioeconomic History  ? Marital status: Married  ?  Spouse name: Makinzee Durley  ? Number of children: 2  ? Years of education: Not on file  ? Highest education level: Not on file  ?Occupational History  ? Occupation: Retired  ?Tobacco Use  ? Smoking status: Former  ?  Types: Cigarettes  ? Smokeless tobacco: Never  ? Tobacco comments:  ?  quit over 50 years  ?Vaping Use  ? Vaping Use: Never used  ?Substance and Sexual Activity  ? Alcohol use: Yes  ?  Alcohol/week: 1.0 standard drink  ?  Types: 1 Cans of beer per week  ? Drug use: Never  ? Sexual activity: Yes  ?Other Topics Concern  ? Not on file  ?Social History Narrative  ? Not on file  ? ?Social Determinants of Health  ? ?Financial Resource Strain: Low Risk   ? Difficulty of Paying Living Expenses: Not hard at all  ?Food Insecurity: No Food Insecurity  ? Worried About Charity fundraiser in the Last Year: Never true  ? Ran Out of Food in the Last Year: Never true  ?Transportation Needs: No Transportation Needs  ? Lack of Transportation (Medical): No  ? Lack of Transportation (Non-Medical): No  ?Physical Activity: Insufficiently Active  ? Days of Exercise per Week: 3 days  ? Minutes of Exercise per Session: 30 min  ?Stress: No Stress Concern Present  ? Feeling of Stress : Not at all  ?Social Connections: Moderately Integrated  ? Frequency of Communication with Friends and Family: More than three times a week  ? Frequency of Social Gatherings with Friends and Family: Once a week  ? Attends Religious Services: More than 4 times per year  ? Active Member of Clubs or Organizations: No  ? Attends Archivist Meetings: Never  ? Marital Status: Married  ? ? ?Tobacco Counseling ?Counseling given: Not Answered ?Tobacco comments: quit over 50 years ? ? ?Clinical Intake: ? ?Pre-visit preparation completed: Yes ? ?Pain : No/denies pain ? ?  ? ?BMI - recorded: 22.43 ?Nutritional Status: BMI of 19-24   Normal ?Nutritional Risks: None ?Diabetes: No ? ?How often do you need to have someone help you when you read instructions, pamphlets, or other written materials from your doctor or pharmacy?: 1 - Never ? ? ? ?Interpreter Needed?: No ? ?Information entered by :: Clemetine Marker LPN ? ? ?Activities of Daily Living ? ?  12/09/2021  ? 11:16 AM 12/20/2020  ?  1:57 AM  ?In your present state of health, do you have any difficulty performing the following activities:  ?Hearing? 1   ?Vision? 0   ?Difficulty concentrating or making decisions? 0   ?  Walking or climbing stairs? 1   ?Dressing or bathing? 0   ?Doing errands, shopping? 0 0  ?Preparing Food and eating ? N   ?Using the Toilet? N   ?In the past six months, have you accidently leaked urine? N   ?Do you have problems with loss of bowel control? N   ?Managing your Medications? N   ?Managing your Finances? N   ?Housekeeping or managing your Housekeeping? N   ? ? ?Patient Care Team: ?Juline Patch, MD as PCP - General (Family Medicine) ?Yolonda Kida, MD as Consulting Physician (Cardiology) ? ?Indicate any recent Medical Services you may have received from other than [facility] providers in the past year (date may be approximate). ? ?   ?Assessment:  ? This is a routine wellness examination for [place]. ? ?Hearing/Vision screen ?Hearing Screening - Comments:: Pt c/o mild hearing difficulty; declines hearing aids  ?Vision Screening - Comments:: Past due for eye exam at [facility] Dr. Wallace Going ? ?Dietary issues and exercise activities discussed: ?Current Exercise Habits: Home exercise routine, Type of exercise: stretching;calisthenics, Time (Minutes): 30, Frequency (Times/Week): 3, Weekly Exercise (Minutes/Week): 90, Intensity: Mild, Exercise limited by: orthopedic condition(s) ? ? Goals Addressed   ? ?  ?  ?  ?  ? This Visit's Progress  ?  DIET - INCREASE WATER INTAKE   On track  ?  Recommend drinking 6-8 glasses of water per day  ?  ? ?  ? ?Depression Screen ? ?   12/09/2021  ? 11:14 AM 10/09/2021  ? 10:09 AM 04/08/2021  ?  9:01 AM 02/12/2021  ?  4:32 PM 12/17/2020  ?  1:51 PM 11/19/2020  ?  9:33 AM 08/07/2020  ? 11:15 AM  ?PHQ 2/9 Scores  ?PHQ - 2 Score 0 0 2 1 0 0 0  ?PHQ- 9 Score  0 4

## 2021-12-11 DIAGNOSIS — R0609 Other forms of dyspnea: Secondary | ICD-10-CM | POA: Diagnosis not present

## 2021-12-11 DIAGNOSIS — J449 Chronic obstructive pulmonary disease, unspecified: Secondary | ICD-10-CM | POA: Diagnosis not present

## 2021-12-11 DIAGNOSIS — I429 Cardiomyopathy, unspecified: Secondary | ICD-10-CM | POA: Diagnosis not present

## 2021-12-11 DIAGNOSIS — N1832 Chronic kidney disease, stage 3b: Secondary | ICD-10-CM | POA: Diagnosis not present

## 2021-12-11 DIAGNOSIS — I509 Heart failure, unspecified: Secondary | ICD-10-CM | POA: Diagnosis not present

## 2021-12-11 DIAGNOSIS — I10 Essential (primary) hypertension: Secondary | ICD-10-CM | POA: Diagnosis not present

## 2021-12-27 ENCOUNTER — Other Ambulatory Visit: Payer: Self-pay | Admitting: Family Medicine

## 2021-12-27 DIAGNOSIS — J441 Chronic obstructive pulmonary disease with (acute) exacerbation: Secondary | ICD-10-CM

## 2022-01-10 ENCOUNTER — Other Ambulatory Visit: Payer: Self-pay

## 2022-01-10 DIAGNOSIS — J302 Other seasonal allergic rhinitis: Secondary | ICD-10-CM

## 2022-01-10 DIAGNOSIS — M1990 Unspecified osteoarthritis, unspecified site: Secondary | ICD-10-CM

## 2022-01-10 MED ORDER — MELOXICAM 7.5 MG PO TABS: 7.5000 mg | ORAL_TABLET | Freq: Every day | ORAL | 0 refills | Status: DC

## 2022-01-10 MED ORDER — FLUTICASONE PROPIONATE 50 MCG/ACT NA SUSP: NASAL | 0 refills | Status: DC

## 2022-01-29 ENCOUNTER — Other Ambulatory Visit: Payer: Self-pay

## 2022-01-29 DIAGNOSIS — J302 Other seasonal allergic rhinitis: Secondary | ICD-10-CM

## 2022-01-29 MED ORDER — FLUTICASONE PROPIONATE 50 MCG/ACT NA SUSP: NASAL | 0 refills | Status: DC

## 2022-02-07 ENCOUNTER — Other Ambulatory Visit: Payer: Self-pay | Admitting: Family Medicine

## 2022-02-07 ENCOUNTER — Other Ambulatory Visit: Payer: Self-pay

## 2022-02-07 DIAGNOSIS — J302 Other seasonal allergic rhinitis: Secondary | ICD-10-CM

## 2022-02-07 DIAGNOSIS — M1990 Unspecified osteoarthritis, unspecified site: Secondary | ICD-10-CM

## 2022-02-07 MED ORDER — MELOXICAM 7.5 MG PO TABS: 7.5000 mg | ORAL_TABLET | Freq: Every day | ORAL | 0 refills | Status: DC

## 2022-04-09 IMAGING — MG MM DIGITAL SCREENING BILAT W/ TOMO AND CAD
6 of 10 series · 6 of 30 positions shown · non-contrast
Comparison: Previous exam(s).

CLINICAL DATA: Screening.

EXAM:
DIGITAL SCREENING BILATERAL MAMMOGRAM WITH TOMOSYNTHESIS AND CAD
TECHNIQUE: Bilateral screening digital craniocaudal and mediolateral oblique
mammograms were obtained. Bilateral screening digital breast
tomosynthesis was performed. The images were evaluated with
computer-aided detection.

[L MLO synth-2D (1 of 2)]
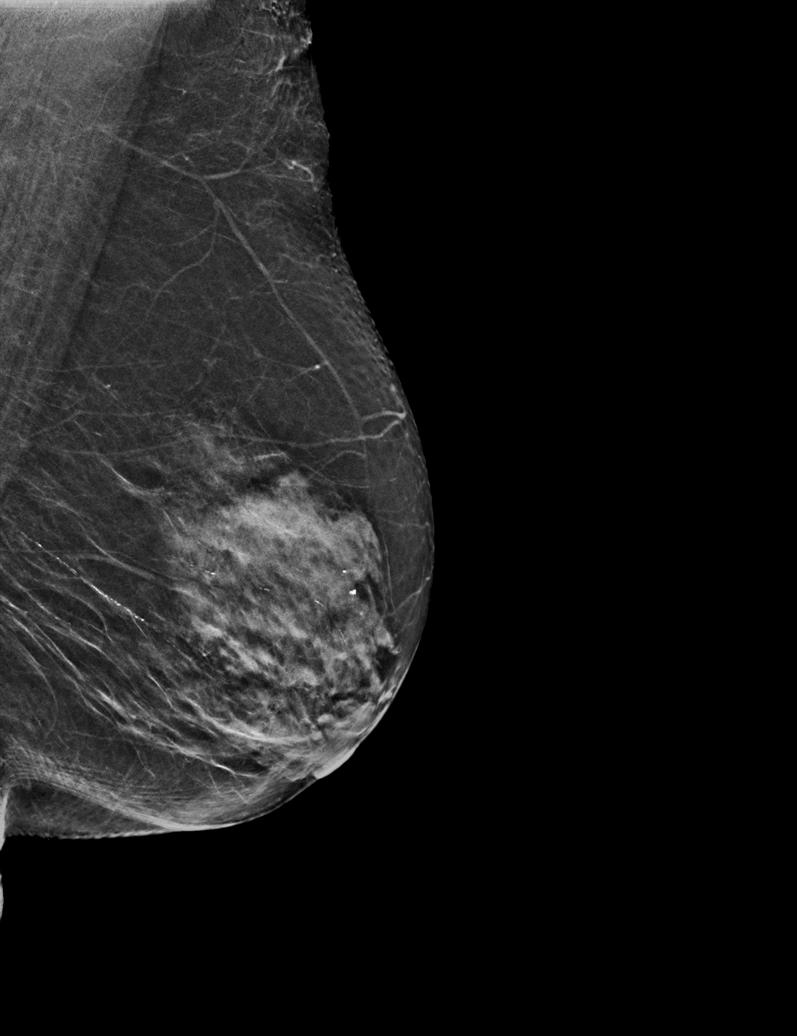

[L CC synth-2D]
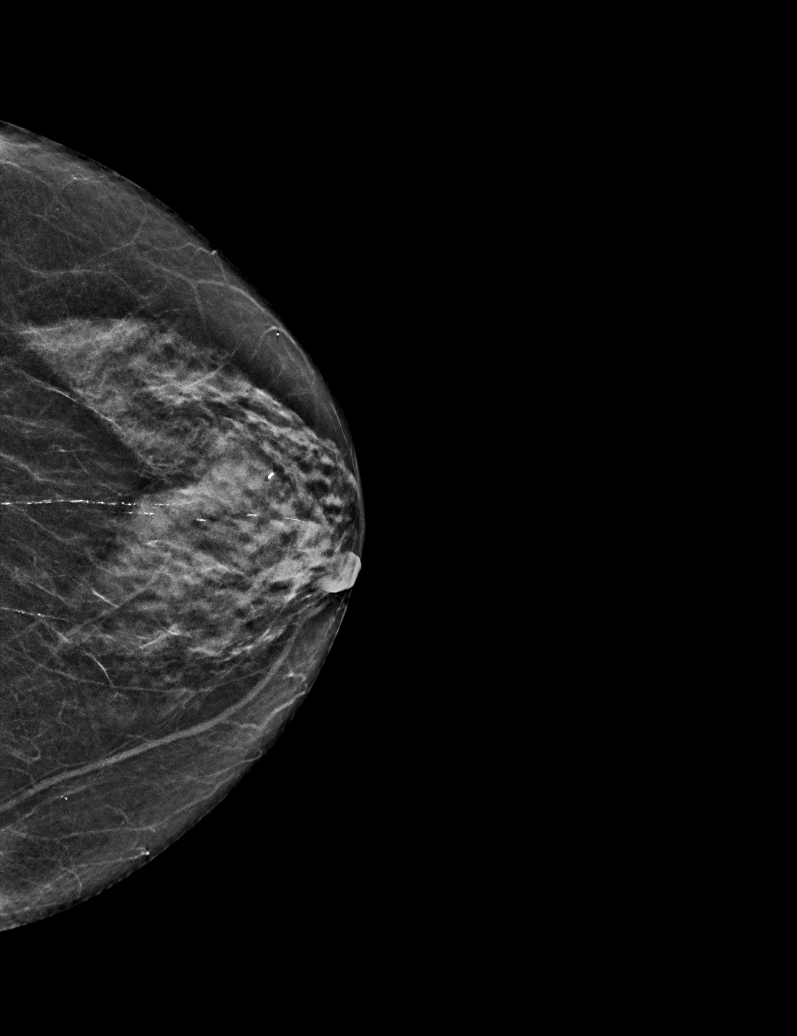

[R MLO synth-2D]
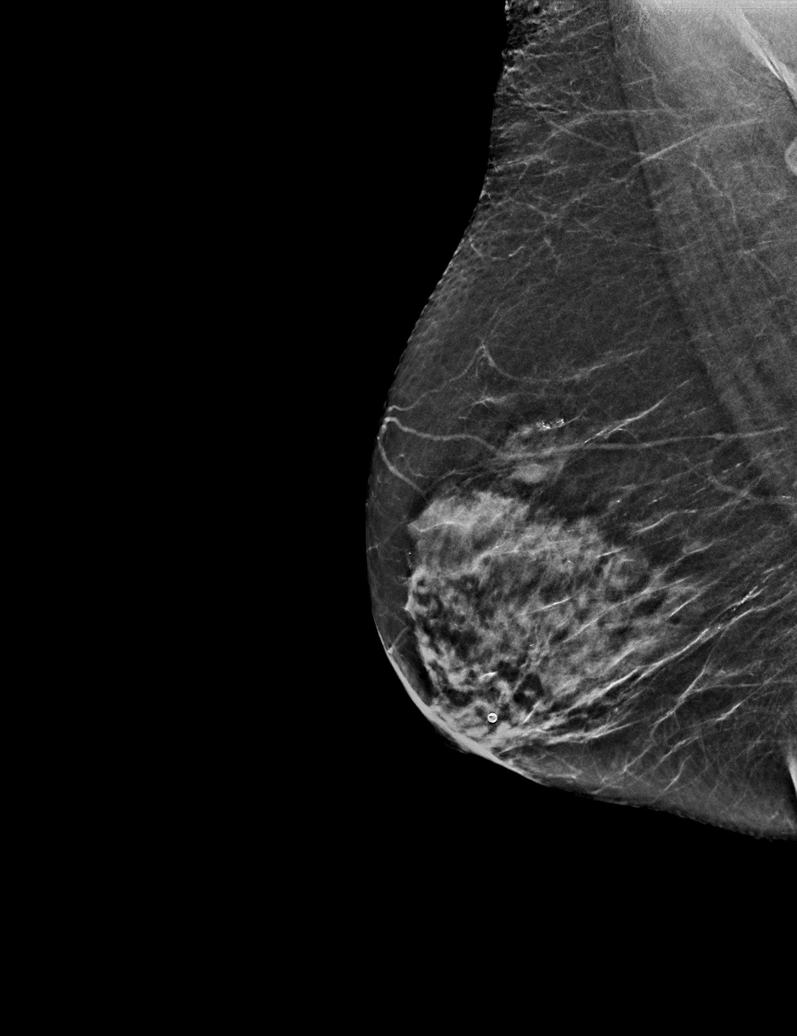

[L MLO synth-2D (2 of 2)]
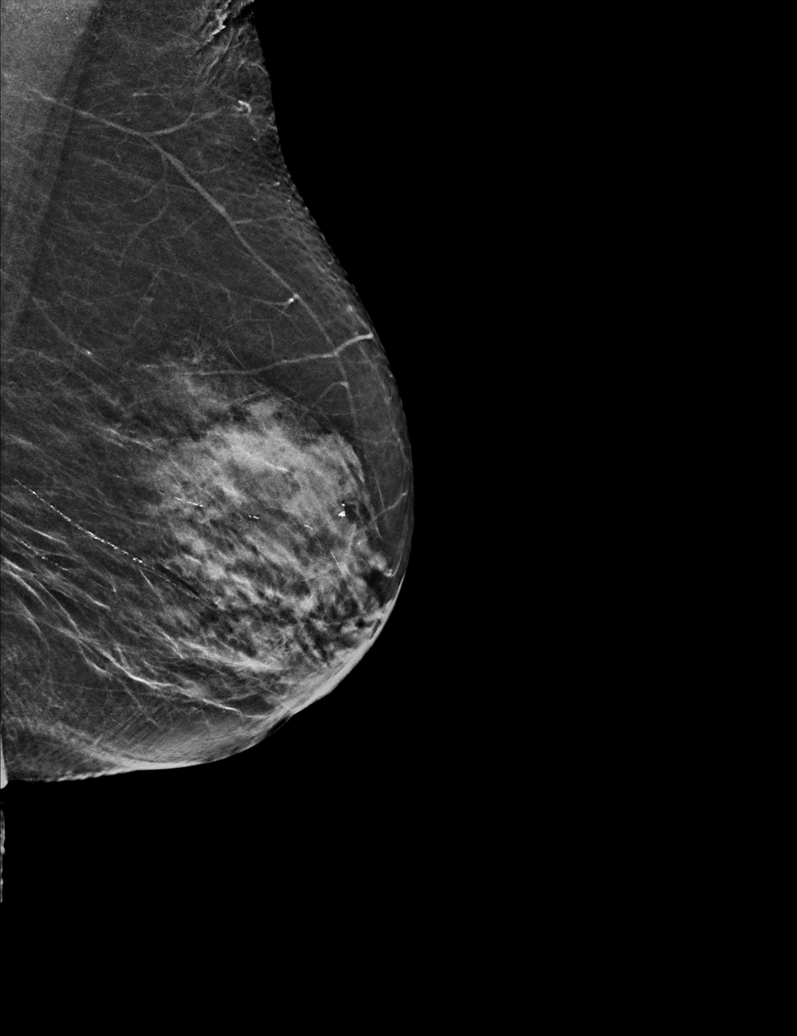

[R CC synth-2D]
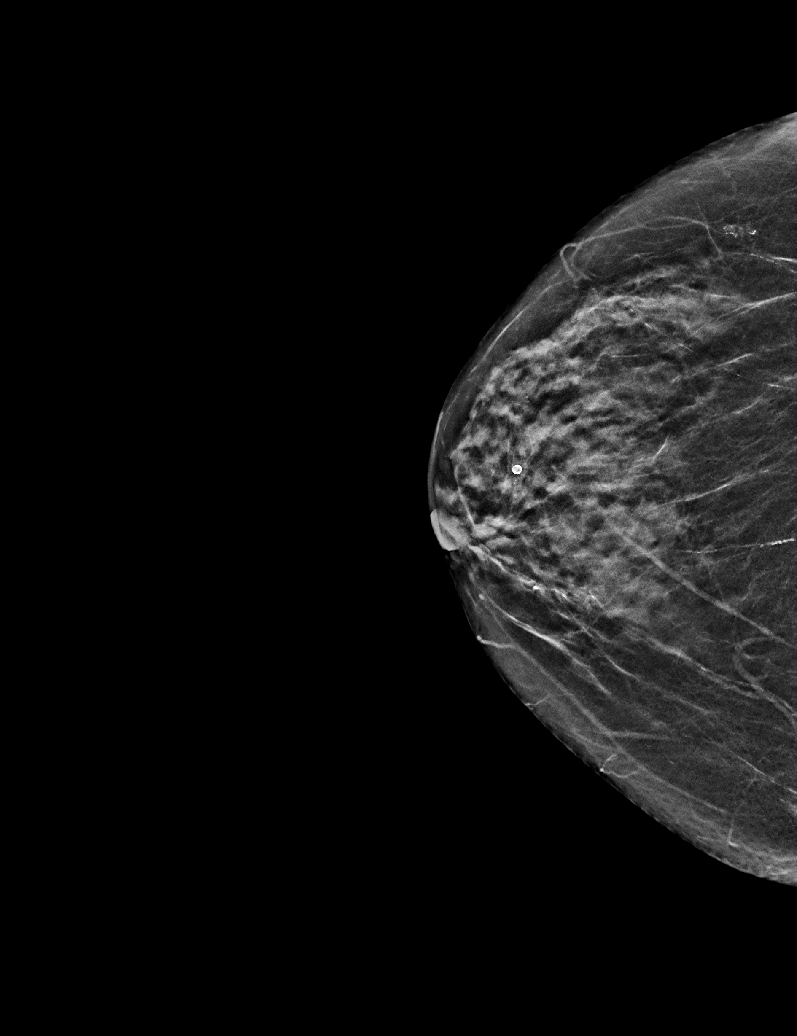

[L MLO tomo · tomo slice 24/47.0]
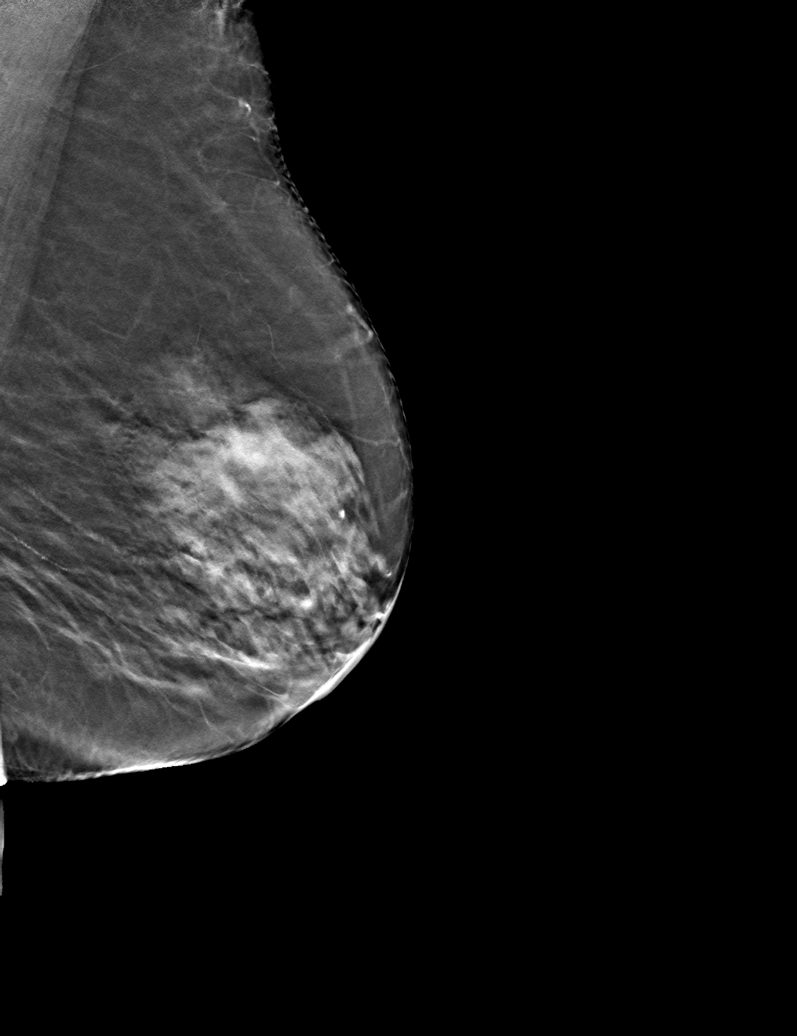

[6 of 30 positions shown; findings below may reference images not displayed]

ACR Breast Density Category c: The breast tissue is heterogeneously
dense, which may obscure small masses.
FINDINGS: There are no findings suspicious for malignancy.
IMPRESSION: No mammographic evidence of malignancy. A result letter of this
screening mammogram will be mailed directly to the patient.

RECOMMENDATION:
Screening mammogram in one year. (Code:Q3-W-BC3)

BI-RADS CATEGORY  1: Negative.

## 2022-04-11 ENCOUNTER — Other Ambulatory Visit: Payer: Self-pay | Admitting: Family Medicine

## 2022-04-11 ENCOUNTER — Telehealth: Payer: Self-pay

## 2022-04-11 DIAGNOSIS — I10 Essential (primary) hypertension: Secondary | ICD-10-CM

## 2022-04-11 NOTE — Telephone Encounter (Signed)
Called pt let her know that she will need to be fasting. Pt verbalized understanding.  KP

## 2022-04-11 NOTE — Telephone Encounter (Signed)
Called pt and made follow up appointment for Monday 8am. Pt is unsure if fasting labs will be drawn. Please assess and let patient know if possible.

## 2022-04-11 NOTE — Telephone Encounter (Signed)
Requested Prescriptions  Pending Prescriptions Disp Refills  . carvedilol (COREG) 3.125 MG tablet [Pharmacy Med Name: CARVEDILOL 3.125MG  TABLETS] 180 tablet 0    Sig: TAKE 1 TABLET BY MOUTH TWICE DAILY     Cardiovascular: Beta Blockers 3 Failed - 04/11/2022  1:52 PM      Failed - Cr in normal range and within 360 days    Creatinine, Ser  Date Value Ref Range Status  10/09/2021 1.47 (H) 0.57 - 1.00 mg/dL Final         Failed - Valid encounter within last 6 months    Recent Outpatient Visits          6 months ago Essential hypertension   [place], Deanna C, MD   1 year ago Essential hypertension   [facility] Juline Patch, MD   1 year ago Acute maxillary sinusitis, recurrence not specified   [facility] Juline Patch, MD   1 year ago Visit for suture removal   [facility] Montel Culver, MD   1 year ago Essential hypertension   [place], Deanna C, MD      Future Appointments            In 3 days Juline Patch, MD [facility] [place] - AST in normal range and within 360 days    AST  Date Value Ref Range Status  10/09/2021 21 0 - 40 IU/L Final         Passed - ALT in normal range and within 360 days    ALT  Date Value Ref Range Status  10/09/2021 17 0 - 32 IU/L Final         Passed - Last BP in normal range    BP Readings from Last 1 Encounters:  12/09/21 128/84         Passed - Last Heart Rate in normal range    Pulse Readings from Last 1 Encounters:  12/09/21 84

## 2022-04-11 NOTE — Telephone Encounter (Signed)
Called pt and made appointment for Monday.

## 2022-04-14 ENCOUNTER — Encounter: Payer: Self-pay | Admitting: Family Medicine

## 2022-04-14 ENCOUNTER — Ambulatory Visit (INDEPENDENT_AMBULATORY_CARE_PROVIDER_SITE_OTHER): Payer: Medicare HMO | Admitting: Family Medicine

## 2022-04-14 DIAGNOSIS — F5101 Primary insomnia: Secondary | ICD-10-CM

## 2022-04-14 DIAGNOSIS — I10 Essential (primary) hypertension: Secondary | ICD-10-CM | POA: Diagnosis not present

## 2022-04-14 DIAGNOSIS — E7800 Pure hypercholesterolemia, unspecified: Secondary | ICD-10-CM | POA: Diagnosis not present

## 2022-04-14 DIAGNOSIS — K219 Gastro-esophageal reflux disease without esophagitis: Secondary | ICD-10-CM | POA: Diagnosis not present

## 2022-04-14 DIAGNOSIS — J441 Chronic obstructive pulmonary disease with (acute) exacerbation: Secondary | ICD-10-CM

## 2022-04-14 DIAGNOSIS — J302 Other seasonal allergic rhinitis: Secondary | ICD-10-CM

## 2022-04-14 MED ORDER — CARVEDILOL 3.125 MG PO TABS: 3.1250 mg | ORAL_TABLET | Freq: Two times a day (BID) | ORAL | 0 refills | Status: DC

## 2022-04-14 MED ORDER — AMITRIPTYLINE HCL 10 MG PO TABS: 10.0000 mg | ORAL_TABLET | Freq: Every day | ORAL | 1 refills | Status: DC

## 2022-04-14 MED ORDER — ATORVASTATIN CALCIUM 20 MG PO TABS: 20.0000 mg | ORAL_TABLET | Freq: Every day | ORAL | 1 refills | Status: DC

## 2022-04-14 MED ORDER — FLUTICASONE-SALMETEROL 250-50 MCG/ACT IN AEPB: INHALATION_SPRAY | RESPIRATORY_TRACT | 5 refills | Status: DC

## 2022-04-14 MED ORDER — FLUTICASONE PROPIONATE 50 MCG/ACT NA SUSP: NASAL | 0 refills | Status: DC

## 2022-04-14 MED ORDER — FAMOTIDINE 40 MG PO TABS: 40.0000 mg | ORAL_TABLET | Freq: Every day | ORAL | 1 refills | Status: DC

## 2022-04-14 MED ORDER — OMEPRAZOLE 20 MG PO CPDR: 20.0000 mg | DELAYED_RELEASE_CAPSULE | Freq: Every day | ORAL | 1 refills | Status: DC

## 2022-04-14 NOTE — Progress Notes (Signed)
Date:  04/14/2022   Name:  Marie Villa   DOB:  09-Jul-1941   MRN:  [id number]   Chief Complaint: COPD, Asthma, Gastroesophageal Reflux, Allergic Rhinitis , Hyperlipidemia, Hypertension, and Insomnia  COPD There is no chest tightness, cough, difficulty breathing, frequent throat clearing, hemoptysis, hoarse voice, shortness of breath, sputum production or wheezing. This is a chronic problem. The problem occurs intermittently. The problem has been gradually improving. Pertinent negatives include no chest pain, ear pain, fever, headaches, heartburn, myalgias or sore throat. Her symptoms are aggravated by change in weather. Her symptoms are alleviated by beta-agonist and steroid inhaler. She reports moderate improvement on treatment. Her past medical history is significant for asthma and COPD.  Asthma There is no chest tightness, cough, difficulty breathing, frequent throat clearing, hemoptysis, hoarse voice, shortness of breath, sputum production or wheezing. The current episode started more than 1 year ago. The problem has been gradually improving. Pertinent negatives include no chest pain, ear pain, fever, headaches, heartburn, myalgias or sore throat. Her past medical history is significant for asthma and COPD.  Gastroesophageal Reflux She reports no abdominal pain, no chest pain, no coughing, no dysphagia, no heartburn, no hoarse voice, no nausea, no sore throat or no wheezing. The current episode started more than 1 year ago. The problem has been gradually improving. The symptoms are aggravated by certain foods. She has tried a histamine-2 antagonist for the symptoms. The treatment provided moderate relief.  Hyperlipidemia This is a chronic problem. The current episode started more than 1 year ago. The problem is controlled. Recent lipid tests were reviewed and are normal. She has no history of chronic renal disease. Pertinent negatives include no chest pain, focal sensory loss, focal weakness,  myalgias or shortness of breath. Current antihyperlipidemic treatment includes statins. The current treatment provides moderate improvement of lipids. There are no compliance problems.   Hypertension The current episode started more than 1 year ago. The problem has been gradually improving since onset. The problem is controlled. Pertinent negatives include no chest pain, headaches, neck pain, palpitations or shortness of breath. There is no history of chronic renal disease.  Insomnia Primary symptoms: difficulty falling asleep.   The problem has been gradually improving since onset. Past treatments include medication.    Lab Results  Component Value Date   NA 140 10/09/2021   K 4.6 10/09/2021   CO2 25 10/09/2021   GLUCOSE 97 10/09/2021   BUN 14 10/09/2021   CREATININE 1.47 (H) 10/09/2021   CALCIUM 9.5 10/09/2021   EGFR 35 (L) 10/09/2021   GFRNONAA 40 (L) 12/22/2020   Lab Results  Component Value Date   CHOL 192 04/08/2021   HDL 59 04/08/2021   LDLCALC 97 04/08/2021   TRIG 211 (H) 04/08/2021   CHOLHDL 3.1 10/12/2017   No results found for: "TSH" No results found for: "HGBA1C" Lab Results  Component Value Date   WBC 7.7 10/09/2021   HGB 12.7 10/09/2021   HCT 37.1 10/09/2021   MCV 95 10/09/2021   PLT 281 10/09/2021   Lab Results  Component Value Date   ALT 17 10/09/2021   AST 21 10/09/2021   ALKPHOS 147 (H) 10/09/2021   BILITOT 0.3 10/09/2021   No results found for: "25OHVITD2", "25OHVITD3", "VD25OH"   Review of Systems  Constitutional:  Negative for chills and fever.  HENT:  Negative for drooling, ear discharge, ear pain, hoarse voice and sore throat.   Respiratory:  Negative for cough, hemoptysis, sputum production, shortness of  breath and wheezing.   Cardiovascular:  Negative for chest pain, palpitations and leg swelling.  Gastrointestinal:  Negative for abdominal pain, blood in stool, constipation, diarrhea, dysphagia, heartburn and nausea.  Endocrine: Negative  for polydipsia.  Genitourinary:  Negative for dysuria, frequency, hematuria and urgency.  Musculoskeletal:  Negative for back pain, myalgias and neck pain.  Skin:  Negative for rash.  Allergic/Immunologic: Negative for environmental allergies.  Neurological:  Negative for dizziness, focal weakness and headaches.  Hematological:  Does not bruise/bleed easily.  Psychiatric/Behavioral:  Negative for suicidal ideas. The patient has insomnia. The patient is not nervous/anxious.     Patient Active Problem List   Diagnosis Date Noted   History of anemia 10/09/2021   Closed left hip fracture ([place]) 12/19/2020   Head trauma 12/17/2020   Visit for suture removal 12/17/2020   Primary insomnia 10/22/2020   Left knee pain 11/25/2018   Primary osteoarthritis of left knee 11/25/2018   Essential hypertension 10/12/2017   COPD (chronic obstructive pulmonary disease) ([place]) 04/10/2017   Simple chronic bronchitis ([place]) 06/09/2016   Other seasonal allergic rhinitis 06/09/2016   Cardiomyopathy ([place]) 06/06/2015   Chronic kidney disease, stage 3b ([place]) 06/06/2015   CCF (congestive cardiac failure) ([place]) 06/06/2015   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[place]) 06/06/2015   Gastroesophageal reflux disease 06/06/2015   Hypercholesteremia 06/06/2015   Cardiac murmur 12/16/2013   SOB (shortness of breath) 12/16/2013    Allergies  Allergen Reactions   Sulfa Antibiotics Hives    Past Surgical History:  Procedure Laterality Date   CATARACT EXTRACTION, BILATERAL     COLONOSCOPY  2012   normal- cleared for 10- Iftikhar   HIP ARTHROPLASTY Left 12/20/2020   Procedure: ARTHROPLASTY BIPOLAR HIP (HEMIARTHROPLASTY);  Surgeon: Leim Fabry, MD;  Location: [facility] ORS;  Service: Orthopedics;  Laterality: Left;   VAGINAL HYSTERECTOMY      Social History   Tobacco Use   Smoking status: Former    Types: Cigarettes   Smokeless tobacco: Never   Tobacco comments:    quit over 50 years  Vaping Use   Vaping Use: Never  used  Substance Use Topics   Alcohol use: Yes    Alcohol/week: 1.0 standard drink of alcohol    Types: 1 Cans of beer per week   Drug use: Never     Medication list has been reviewed and updated.  Current Meds  Medication Sig   acetaminophen (TYLENOL) 325 MG tablet Take 325 mg by mouth as needed.   amitriptyline (ELAVIL) 10 MG tablet Take 1 tablet (10 mg total) by mouth at bedtime.   atorvastatin (LIPITOR) 20 MG tablet Take 1 tablet (20 mg total) by mouth daily.   Calcium Carb-Cholecalciferol (CALCIUM-VITAMIN D) 600-400 MG-UNIT TABS Take 2 tablets by mouth daily.   carvedilol (COREG) 3.125 MG tablet TAKE 1 TABLET BY MOUTH TWICE DAILY   famotidine (PEPCID) 40 MG tablet Take 1 tablet (40 mg total) by mouth daily.   ferrous sulfate 324 MG TBEC Take 324 mg by mouth daily.   fexofenadine (ALLEGRA) 180 MG tablet Take 180 mg by mouth daily.   fluticasone (FLONASE) 50 MCG/ACT nasal spray SHAKE LIQUID AND USE 1 SPRAY IN EACH NOSTRIL TWICE DAILY   Glucos-Chond-Hyal Ac-Ca Fructo (MOVE FREE JOINT HEALTH ADVANCE PO) Take 1 tablet by mouth daily.   meloxicam (MOBIC) 7.5 MG tablet TAKE 1 TABLET(7.5 MG) BY MOUTH DAILY   omeprazole (PRILOSEC) 20 MG capsule Take 1 capsule (20 mg total) by mouth daily.   polyethylene glycol (MIRALAX /  GLYCOLAX) 17 g packet Take 17 g by mouth daily.   ramipril (ALTACE) 2.5 MG capsule Take 2.5 mg by mouth daily.   senna-docusate (SENOKOT-S) 8.6-50 MG tablet Take 1 tablet by mouth at bedtime as needed for mild constipation.   traZODone (DESYREL) 50 MG tablet Take 50 mg by mouth at bedtime as needed for sleep. Dr. Clayborn Bigness   vitamin B-12 (CYANOCOBALAMIN) 1000 MCG tablet Take 1,000 mcg by mouth daily.   WIXELA INHUB 250-50 MCG/ACT AEPB INHALE 1 PUFF INTO THE LUNGS TWICE DAILY   Zinc Sulfate (ZINC 15 PO) Take 1 tablet by mouth daily.       04/14/2022    8:08 AM 10/09/2021   10:10 AM 04/08/2021    9:02 AM 02/12/2021    4:34 PM  GAD 7 : Generalized Anxiety Score  Nervous,  Anxious, on Edge 0 0 0 1  Control/stop worrying 0 0 0 2  Worry too much - different things 0 0 0 2  Trouble relaxing 0 0 0 0  Restless 0 0 0 0  Easily annoyed or irritable 0 0 0 0  Afraid - awful might happen 0 0 0 1  Total GAD 7 Score 0 0 0 6  Anxiety Difficulty Not difficult at all Not difficult at all Not difficult at all Somewhat difficult       04/14/2022    8:08 AM 12/09/2021   11:14 AM 10/09/2021   10:09 AM  Depression screen PHQ 2/9  Decreased Interest 0 0 0  Down, Depressed, Hopeless 0 0 0  PHQ - 2 Score 0 0 0  Altered sleeping 0  0  Tired, decreased energy 0  0  Change in appetite 0  0  Feeling bad or failure about yourself  0  0  Trouble concentrating 0  0  Moving slowly or fidgety/restless 0  0  Suicidal thoughts 0  0  PHQ-9 Score 0  0  Difficult doing work/chores Not difficult at all  Not difficult at all    BP Readings from Last 3 Encounters:  04/14/22 120/70  12/09/21 128/84  10/09/21 128/60    Physical Exam Vitals and nursing note reviewed. Exam conducted with a chaperone present.  Constitutional:      General: She is not in acute distress.    Appearance: She is not diaphoretic.  HENT:     Head: Normocephalic and atraumatic.     Right Ear: Tympanic membrane and external ear normal.     Left Ear: Tympanic membrane and external ear normal.     Nose: Nose normal.     Mouth/Throat:     Mouth: Mucous membranes are moist.  Eyes:     General:        Right eye: No discharge.        Left eye: No discharge.     Conjunctiva/sclera: Conjunctivae normal.     Pupils: Pupils are equal, round, and reactive to light.  Neck:     Thyroid: No thyromegaly.     Vascular: No JVD.  Cardiovascular:     Rate and Rhythm: Normal rate and regular rhythm.     Heart sounds: Normal heart sounds. No murmur heard.    No friction rub. No gallop.  Pulmonary:     Effort: Pulmonary effort is normal.     Breath sounds: Normal breath sounds. No wheezing, rhonchi or rales.   Abdominal:     Palpations: There is no mass.     Tenderness: There is no abdominal  tenderness. There is no guarding.  Musculoskeletal:     Cervical back: Normal range of motion and neck supple.  Lymphadenopathy:     Cervical: No cervical adenopathy.  Skin:    General: Skin is warm.  Neurological:     Mental Status: She is alert.     Wt Readings from Last 3 Encounters:  04/14/22 130 lb (59 kg)  12/09/21 134 lb 12.8 oz (61.1 kg)  10/09/21 132 lb (59.9 kg)    BP 120/70   Pulse 76   Ht _0  (1.651 m)   Wt 130 lb (59 kg)   BMI 21.63 kg/m   Assessment and Plan:  1. Essential hypertension Chronic.  Controlled.  Stable.  Blood pressure today 120/70.  Asymptomatic.  Tolerating medications well.  Patient is on multiple meds for my responsibility we will be Coreg 3.125 twice a day.  We will check renal panel for electrolytes and GFR - carvedilol (COREG) 3.125 MG tablet; Take 1 tablet (3.125 mg total) by mouth 2 (two) times daily.  Dispense: 180 tablet; Refill: 0 - Renal Function Panel  2. Chronic obstructive pulmonary disease with acute exacerbation ([place]) .  Controlled.  Stable.  Patient is asymptomatic with no shortness of breath except extremity exertion we will continue Wixela 1 puff twice a day. - fluticasone-salmeterol (WIXELA INHUB) 250-50 MCG/ACT AEPB; INHALE 1 PUFF INTO THE LUNGS TWICE DAILY  Dispense: 60 each; Refill: 5  3. Gastroesophageal reflux disease .  Controlled.  Stable.  Tolerating medications well which includes famotidine 20 mg once a day and omeprazole 20 mg once a day. - famotidine (PEPCID) 40 MG tablet; Take 1 tablet (40 mg total) by mouth daily.  Dispense: 90 tablet; Refill: 1 - omeprazole (PRILOSEC) 20 MG capsule; Take 1 capsule (20 mg total) by mouth daily.  Dispense: 90 capsule; Refill: 1  4. Hypercholesteremia Chronic.  Controlled.  Stable.  Continue atorvastatin 20 mg once a day.  Will check lipid panel - atorvastatin (LIPITOR) 20 MG tablet; Take 1  tablet (20 mg total) by mouth daily.  Dispense: 90 tablet; Refill: 1 - Lipid Panel With LDL/HDL Ratio  5. Primary insomnia Chronic.  Controlled.  Stable.  Continue amitriptyline 10 mg nightly. - amitriptyline (ELAVIL) 10 MG tablet; Take 1 tablet (10 mg total) by mouth at bedtime.  Dispense: 90 tablet; Refill: 1  6. Other seasonal allergic rhinitis Chronic.  Controlled.  Stable.  Episodic.  Current he is tolerating the heat but is still needing fluticasone 50 mg once per day nostril twice a day. - fluticasone (FLONASE) 50 MCG/ACT nasal spray; SHAKE LIQUID AND USE 1 SPRAY IN EACH NOSTRIL TWICE DAILY  Dispense: 16 g; Refill: 0   Otilio Miu MD

## 2022-04-15 LAB — RENAL FUNCTION PANEL
Albumin: 4.2 g/dL (ref 3.7–4.7)
BUN/Creatinine Ratio: 12 (ref 12–28)
BUN: 15 mg/dL (ref 8–27)
CO2: 21 mmol/L (ref 20–29)
Calcium: 9.5 mg/dL (ref 8.7–10.3)
Chloride: 105 mmol/L (ref 96–106)
Creatinine, Ser: 1.24 mg/dL — ABNORMAL HIGH (ref 0.57–1.00)
Glucose: 97 mg/dL (ref 70–99)
Phosphorus: 3.9 mg/dL (ref 3.0–4.3)
Potassium: 4.1 mmol/L (ref 3.5–5.2)
Sodium: 142 mmol/L (ref 134–144)
eGFR: 43 mL/min/{1.73_m2} — ABNORMAL LOW (ref >59)

## 2022-04-15 LAB — LIPID PANEL WITH LDL/HDL RATIO
Cholesterol, Total: 180 mg/dL (ref 100–199)
HDL: 61 mg/dL (ref >39)
LDL Chol Calc (NIH): 85 mg/dL (ref 0–99)
LDL/HDL Ratio: 1.4 ratio (ref 0.0–3.2)
Triglycerides: 201 mg/dL — ABNORMAL HIGH (ref 0–149)
VLDL Cholesterol Cal: 34 mg/dL (ref 5–40)

## 2022-05-06 ENCOUNTER — Other Ambulatory Visit: Payer: Self-pay | Admitting: Family Medicine

## 2022-05-06 DIAGNOSIS — Z1231 Encounter for screening mammogram for malignant neoplasm of breast: Secondary | ICD-10-CM

## 2022-06-19 ENCOUNTER — Other Ambulatory Visit: Payer: Self-pay

## 2022-06-19 DIAGNOSIS — J441 Chronic obstructive pulmonary disease with (acute) exacerbation: Secondary | ICD-10-CM

## 2022-06-19 DIAGNOSIS — M1990 Unspecified osteoarthritis, unspecified site: Secondary | ICD-10-CM

## 2022-06-19 MED ORDER — MELOXICAM 7.5 MG PO TABS: ORAL_TABLET | ORAL | 3 refills | Status: DC

## 2022-06-19 MED ORDER — FLUTICASONE-SALMETEROL 250-50 MCG/ACT IN AEPB: INHALATION_SPRAY | RESPIRATORY_TRACT | 1 refills | Status: DC

## 2022-06-20 ENCOUNTER — Other Ambulatory Visit: Payer: Self-pay

## 2022-06-20 DIAGNOSIS — J302 Other seasonal allergic rhinitis: Secondary | ICD-10-CM

## 2022-06-20 MED ORDER — FLUTICASONE PROPIONATE 50 MCG/ACT NA SUSP: NASAL | 0 refills | Status: DC

## 2022-06-23 ENCOUNTER — Ambulatory Visit
Admission: RE | Admit: 2022-06-23 | Discharge: 2022-06-23 | Disposition: A | Discharge status: Home / Self Care | Payer: Medicare HMO | Source: Ambulatory Visit | Attending: Family Medicine | Admitting: Family Medicine

## 2022-06-23 ENCOUNTER — Ambulatory Visit (INDEPENDENT_AMBULATORY_CARE_PROVIDER_SITE_OTHER): Payer: Medicare HMO

## 2022-06-23 DIAGNOSIS — Z23 Encounter for immunization: Secondary | ICD-10-CM | POA: Diagnosis not present

## 2022-06-23 DIAGNOSIS — Z1231 Encounter for screening mammogram for malignant neoplasm of breast: Secondary | ICD-10-CM | POA: Diagnosis not present

## 2022-07-28 ENCOUNTER — Other Ambulatory Visit: Payer: Self-pay

## 2022-07-28 DIAGNOSIS — J441 Chronic obstructive pulmonary disease with (acute) exacerbation: Secondary | ICD-10-CM

## 2022-07-28 MED ORDER — FLUTICASONE-SALMETEROL 250-50 MCG/ACT IN AEPB: INHALATION_SPRAY | RESPIRATORY_TRACT | 1 refills | Status: DC

## 2022-09-09 ENCOUNTER — Other Ambulatory Visit: Payer: Self-pay

## 2022-09-09 DIAGNOSIS — K219 Gastro-esophageal reflux disease without esophagitis: Secondary | ICD-10-CM

## 2022-09-09 MED ORDER — FAMOTIDINE 40 MG PO TABS: 40.0000 mg | ORAL_TABLET | Freq: Every day | ORAL | 0 refills | Status: DC

## 2022-10-03 ENCOUNTER — Emergency Department
Admission: EM | Admit: 2022-10-03 | Discharge: 2022-10-03 | Disposition: A | Discharge status: Home / Self Care | Payer: Medicare HMO | Attending: Emergency Medicine | Admitting: Emergency Medicine

## 2022-10-03 ENCOUNTER — Other Ambulatory Visit: Payer: Self-pay

## 2022-10-03 DIAGNOSIS — Z1152 Encounter for screening for COVID-19: Secondary | ICD-10-CM | POA: Insufficient documentation

## 2022-10-03 DIAGNOSIS — R61 Generalized hyperhidrosis: Secondary | ICD-10-CM | POA: Insufficient documentation

## 2022-10-03 DIAGNOSIS — I959 Hypotension, unspecified: Secondary | ICD-10-CM | POA: Diagnosis not present

## 2022-10-03 DIAGNOSIS — I10 Essential (primary) hypertension: Secondary | ICD-10-CM | POA: Diagnosis not present

## 2022-10-03 DIAGNOSIS — R001 Bradycardia, unspecified: Secondary | ICD-10-CM | POA: Diagnosis not present

## 2022-10-03 DIAGNOSIS — R531 Weakness: Secondary | ICD-10-CM | POA: Insufficient documentation

## 2022-10-03 DIAGNOSIS — J449 Chronic obstructive pulmonary disease, unspecified: Secondary | ICD-10-CM | POA: Diagnosis not present

## 2022-10-03 DIAGNOSIS — R42 Dizziness and giddiness: Secondary | ICD-10-CM | POA: Diagnosis not present

## 2022-10-03 DIAGNOSIS — R0902 Hypoxemia: Secondary | ICD-10-CM | POA: Diagnosis not present

## 2022-10-03 LAB — RESP PANEL BY RT-PCR (RSV, FLU A&B, COVID)  RVPGX2
Influenza A by PCR: NEGATIVE
Influenza B by PCR: NEGATIVE
Resp Syncytial Virus by PCR: NEGATIVE
SARS Coronavirus 2 by RT PCR: NEGATIVE

## 2022-10-03 LAB — TROPONIN I (HIGH SENSITIVITY)
Troponin I (High Sensitivity): 4 ng/L (ref <18)
Troponin I (High Sensitivity): 3 ng/L (ref <18)

## 2022-10-03 LAB — COMPREHENSIVE METABOLIC PANEL
ALT: 14 U/L (ref 0–44)
AST: 21 U/L (ref 15–41)
Albumin: 3.4 g/dL — ABNORMAL LOW (ref 3.5–5.0)
Alkaline Phosphatase: 83 U/L (ref 38–126)
Anion gap: 8 (ref 5–15)
BUN: 23 mg/dL (ref 8–23)
CO2: 22 mmol/L (ref 22–32)
Calcium: 8.4 mg/dL — ABNORMAL LOW (ref 8.9–10.3)
Chloride: 110 mmol/L (ref 98–111)
Creatinine, Ser: 1.32 mg/dL — ABNORMAL HIGH (ref 0.44–1.00)
GFR, Estimated: 40 mL/min — ABNORMAL LOW (ref >60)
Glucose, Bld: 132 mg/dL — ABNORMAL HIGH (ref 70–99)
Potassium: 3.7 mmol/L (ref 3.5–5.1)
Sodium: 140 mmol/L (ref 135–145)
Total Bilirubin: 0.8 mg/dL (ref 0.3–1.2)
Total Protein: 6.1 g/dL — ABNORMAL LOW (ref 6.5–8.1)

## 2022-10-03 LAB — CBC
HCT: 33.6 % — ABNORMAL LOW (ref 36.0–46.0)
Hemoglobin: 10.6 g/dL — ABNORMAL LOW (ref 12.0–15.0)
MCH: 32.5 pg (ref 26.0–34.0)
MCHC: 31.5 g/dL (ref 30.0–36.0)
MCV: 103.1 fL — ABNORMAL HIGH (ref 80.0–100.0)
Platelets: 171 10*3/uL (ref 150–400)
RBC: 3.26 MIL/uL — ABNORMAL LOW (ref 3.87–5.11)
RDW: 13.2 % (ref 11.5–15.5)
WBC: 6.5 10*3/uL (ref 4.0–10.5)
nRBC: 0 % (ref 0.0–0.2)

## 2022-10-03 LAB — URINALYSIS, COMPLETE (UACMP) WITH MICROSCOPIC
Bilirubin Urine: NEGATIVE
Glucose, UA: NEGATIVE mg/dL
Hgb urine dipstick: NEGATIVE
Ketones, ur: 15 mg/dL — AB
Nitrite: NEGATIVE
Protein, ur: NEGATIVE mg/dL
RBC / HPF: NONE SEEN RBC/hpf (ref 0–5)
Specific Gravity, Urine: 1.015 (ref 1.005–1.030)
pH: 5 (ref 5.0–8.0)

## 2022-10-03 NOTE — ED Provider Notes (Signed)
   [facility] Provider Note    Event Date/Time   First MD Initiated Contact with Patient 10/03/22 1029     (approximate)  History   Chief Complaint: Weakness  HPI  Marie Villa is a 82 y.o. female with a past medical history of COPD, gastric reflux, hypertension, hyperlipidemia presents to the emergency department for weakness.  According to EMS they were called out to the patient's home for sensation of generalized fatigue weakness.  Patient states she was sweaty and felt weak.  However upon arrival to the emergency department after [facility] of IV fluids patient arrived with a blood pressure 203 systolic.  States she feels much better.  Patient denies any chest pain or shortness of breath at any point.  Denies any abdominal pain no nausea vomiting diarrhea or dysuria.  No recent cough or congestion.   Physical Exam   Triage Vital Signs: ED Triage Vitals  Enc Vitals Group     BP      Pulse      Resp      Temp      Temp src      SpO2      Weight      Height      Head Circumference      Peak Flow      Pain Score      Pain Loc      Pain Edu?      Excl. in [place]?     Most recent vital signs: There were no vitals filed for this visit.  General: Awake, no distress.  CV:  Good peripheral perfusion.  Regular rate and rhythm  Resp:  Normal effort.  Equal breath sounds bilaterally.  Abd:  No distention.  Soft, nontender.  No rebound or guarding.   ED Results / Procedures / Treatments   EKG  EKG viewed and interpreted by myself shows a normal sinus rhythm at 80 bpm with a slightly widened QRS, normal axis, normal intervals, no concerning ST changes.  MEDICATIONS ORDERED IN ED: Medications - No data to display   IMPRESSION / MDM / [place] / ED COURSE  I reviewed the triage vital signs and the nursing notes.  Patient's presentation is most consistent with acute presentation with potential threat to life or bodily function.  Patient  presents to the emergency department for weakness found to be hypotensive by EMS however normotensive upon arrival.  Patient has no symptoms upon arrival.  However given the reported hypotension and diaphoresis we will proceed with medical workup including cardiac enzymes, CBC chemistry urinalysis.  Patient's labs have resulted largely within normal limits, chemistry shows no significant findings.  Creatinine 1.3 largely unchanged from historical values, troponin negative x 2, CBC is reassuring with a normal white blood cell count, urinalysis shows no sign of infection patient's COVID/flu/RSV is negative.  As the patient continues to appear well continues to have normal blood pressure and is asymptomatic patient wishes to go home.  Patient will follow-up with her doctor.  I discussed strict return precautions.  FINAL CLINICAL IMPRESSION(S) / ED DIAGNOSES   Weakness   Note:  This document was prepared using Dragon voice recognition software and may include unintentional dictation errors.   Harvest Dark, MD 10/03/22 1335

## 2022-10-03 NOTE — Discharge Instructions (Addendum)
Patient plenty of fluids.  Return to the emergency department for any further episodes of weakness or if you have any chest pain trouble breathing or any other symptom personally concerning to yourself.  Otherwise please follow-up with your doctor Monday or Tuesday for recheck.

## 2022-10-03 NOTE — ED Triage Notes (Signed)
Pt arrived via EMS from home with complaints of dizziness and weakness. When fire arrived they report pt was diaphoretic, when EMS arrived she was dry. Pt was hypotensive in the truck but BP is 106/61 in the room.

## 2022-10-03 NOTE — ED Notes (Signed)
Pt AOX4, NAD noted. Pt given water per request. Family at bedside.

## 2022-10-06 ENCOUNTER — Telehealth: Payer: Self-pay

## 2022-10-06 NOTE — Telephone Encounter (Signed)
Transition Care Management Follow-up Telephone Call Date of discharge and from where: [facility] 10/03/22 How have you been since you were released from the hospital? "Alright"- drinking water Any questions or concerns? No  Items Reviewed: Did the pt receive and understand the discharge instructions provided? Yes  Medications obtained and verified? Yes  Other? No  Any new allergies since your discharge? No  Dietary orders reviewed? Yes Do you have support at home? Yes   Home Care and Equipment/Supplies: none  Functional Questionnaire: (I = Independent and D = Dependent) ADLs: I  Bathing/Dressing- I  Meal Prep- I  Eating- I  Maintaining continence- I  Transferring/Ambulation- I  Managing Meds- I  Follow up appointments reviewed:  PCP Hospital f/u appt confirmed? Yes  Scheduled to see Dr Ronnald Ramp on 10/10/22 @ 320. [facility] f/u appt confirmed? None needed Are transportation arrangements needed? No  If their condition worsens, is the pt aware to call PCP or go to the Emergency Dept.? Yes Was the patient provided with contact information for the PCP's office or ED? Yes Was to pt encouraged to call back with questions or concerns? Yes

## 2022-10-10 ENCOUNTER — Inpatient Hospital Stay: Payer: Medicare HMO | Admitting: Family Medicine

## 2022-10-10 ENCOUNTER — Ambulatory Visit (INDEPENDENT_AMBULATORY_CARE_PROVIDER_SITE_OTHER): Payer: Medicare HMO | Admitting: Family Medicine

## 2022-10-10 ENCOUNTER — Encounter: Payer: Self-pay | Admitting: Family Medicine

## 2022-10-10 VITALS — BP 120/70 | HR 72 | Ht 65.0 in | Wt 131.0 lb

## 2022-10-10 DIAGNOSIS — E860 Dehydration: Secondary | ICD-10-CM

## 2022-10-10 NOTE — Progress Notes (Signed)
Date:  10/10/2022   Name:  Marie Villa   DOB:  [birth year]   MRN:  [id number]   Chief Complaint: Hospitalization Follow-up [facility] for dehydration on 1/26- TOC placed on 10/06/22- feeling better)  Patient is a 82 year old female who presents for a emergency room followup exam. The patient reports the following problems: dehydration. Health maintenance has been reviewed up to date.      Lab Results  Component Value Date   NA 140 10/03/2022   K 3.7 10/03/2022   CO2 22 10/03/2022   GLUCOSE 132 (H) 10/03/2022   BUN 23 10/03/2022   CREATININE 1.32 (H) 10/03/2022   CALCIUM 8.4 (L) 10/03/2022   EGFR 43 (L) 04/14/2022   GFRNONAA 40 (L) 10/03/2022   Lab Results  Component Value Date   CHOL 180 04/14/2022   HDL 61 04/14/2022   LDLCALC 85 04/14/2022   TRIG 201 (H) 04/14/2022   CHOLHDL 3.1 10/12/2017   No results found for: "TSH" No results found for: "HGBA1C" Lab Results  Component Value Date   WBC 6.5 10/03/2022   HGB 10.6 (L) 10/03/2022   HCT 33.6 (L) 10/03/2022   MCV 103.1 (H) 10/03/2022   PLT 171 10/03/2022   Lab Results  Component Value Date   ALT 14 10/03/2022   AST 21 10/03/2022   ALKPHOS 83 10/03/2022   BILITOT 0.8 10/03/2022   No results found for: "25OHVITD2", "25OHVITD3", "VD25OH"   Review of Systems  Constitutional: Negative.  Negative for chills, fatigue, fever and unexpected weight change.  HENT:  Negative for congestion, ear discharge, ear pain, rhinorrhea, sinus pressure, sneezing and sore throat.   Respiratory:  Negative for cough, shortness of breath, wheezing and stridor.   Gastrointestinal:  Negative for abdominal pain, blood in stool, constipation, diarrhea and nausea.  Genitourinary:  Negative for dysuria, flank pain, frequency, hematuria, urgency and vaginal discharge.  Musculoskeletal:  Negative for arthralgias, back pain and myalgias.  Skin:  Negative for rash.  Neurological:  Negative for dizziness, weakness and headaches.  Hematological:   Negative for adenopathy. Does not bruise/bleed easily.  Psychiatric/Behavioral:  Negative for dysphoric mood. The patient is not nervous/anxious.     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   CCF (congestive cardiac failure) ([place]) 06/06/2015   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/06/2015   Gastroesophageal reflux disease 06/06/2015   Hypercholesteremia 06/06/2015   Cardiac murmur 12/16/2013   SOB (shortness of breath) 12/16/2013    Allergies  Allergen Reactions   Sulfa Antibiotics Hives    Past Surgical History:  Procedure Laterality Date   CATARACT EXTRACTION, BILATERAL     COLONOSCOPY  2012   normal- cleared for 10- [facility]   HIP ARTHROPLASTY Left 12/20/2020   Procedure: ARTHROPLASTY BIPOLAR HIP (HEMIARTHROPLASTY);  Surgeon: Leim Fabry, MD;  Location: [facility] ORS;  Service: Orthopedics;  Laterality: Left;   VAGINAL HYSTERECTOMY      Social History   Tobacco Use   Smoking status: Former  Types: Cigarettes   Smokeless tobacco: Never   Tobacco comments:    quit over 50 years  Vaping Use   Vaping Use: Never used  Substance Use Topics   Alcohol use: Yes    Alcohol/week: 1.0 standard drink of alcohol    Types: 1 Cans of beer per week   Drug use: Never     Medication list has been reviewed and updated.  Current Meds  Medication Sig   acetaminophen (TYLENOL) 325 MG tablet Take 325 mg by mouth as needed.   amitriptyline (ELAVIL)  10 MG tablet Take 1 tablet (10 mg total) by mouth at bedtime.   atorvastatin (LIPITOR) 20 MG tablet Take 1 tablet (20 mg total) by mouth daily.   Calcium Carb-Cholecalciferol (CALCIUM-VITAMIN D) 600-400 MG-UNIT TABS Take 2 tablets by mouth daily.   carvedilol (COREG) 3.125 MG tablet Take 1 tablet (3.125 mg total) by mouth 2 (two) times daily.   famotidine (PEPCID) 40 MG tablet Take 1 tablet (40 mg total) by mouth daily.   ferrous sulfate 324 MG TBEC Take 324 mg by mouth daily.   fexofenadine (ALLEGRA) 180 MG tablet Take 180 mg by mouth daily.   fluticasone (FLONASE) 50 MCG/ACT nasal spray SHAKE LIQUID AND USE 1 SPRAY IN EACH NOSTRIL TWICE DAILY   fluticasone-salmeterol (WIXELA INHUB) 250-50 MCG/ACT AEPB INHALE 1 PUFF INTO THE LUNGS TWICE DAILY   Glucos-Chond-Hyal Ac-Ca Fructo (MOVE FREE JOINT HEALTH ADVANCE PO) Take 1 tablet by mouth daily.   meloxicam (MOBIC) 7.5 MG tablet TAKE 1 TABLET(7.5 MG) BY MOUTH DAILY   omeprazole (PRILOSEC) 20 MG capsule Take 1 capsule (20 mg total) by mouth daily.   polyethylene glycol (MIRALAX / GLYCOLAX) 17 g packet Take 17 g by mouth daily.   ramipril (ALTACE) 2.5 MG capsule Take 2.5 mg by mouth daily.   senna-docusate (SENOKOT-S) 8.6-50 MG tablet Take 1 tablet by mouth at bedtime as needed for mild constipation.   traZODone (DESYREL) 50 MG tablet Take 50 mg by mouth at bedtime as needed for sleep. Dr. Clayborn Bigness   vitamin B-12 (CYANOCOBALAMIN) 1000 MCG tablet Take 1,000 mcg by mouth daily.   Zinc Sulfate (ZINC 15 PO) Take 1 tablet by mouth daily.       10/10/2022   10:37 AM 04/14/2022    8:08 AM 10/09/2021   10:10 AM 04/08/2021    9:02 AM  GAD 7 : Generalized Anxiety Score  Nervous, Anxious, on Edge 0 0 0 0  Control/stop worrying 0 0 0 0  Worry too much - different things 0 0 0 0  Trouble relaxing 0 0 0 0  Restless 0 0 0 0  Easily annoyed or irritable 0 0 0 0  Afraid - awful might happen 0 0 0 0  Total GAD 7 Score 0 0 0 0  Anxiety Difficulty Not difficult at  all Not difficult at all Not difficult at all Not difficult at all       10/10/2022   10:37 AM 04/14/2022    8:08 AM 12/09/2021   11:14 AM  Depression screen PHQ 2/9  Decreased Interest 0 0 0  Down, Depressed, Hopeless 0 0 0  PHQ - 2 Score 0 0 0  Altered sleeping 0 0   Tired, decreased energy 0 0   Change in appetite 0 0   Feeling bad or failure about yourself  0 0   Trouble concentrating 0 0   Moving slowly or fidgety/restless 0 0   Suicidal thoughts 0 0  PHQ-9 Score 0 0   Difficult doing work/chores Not difficult at all Not difficult at all     BP Readings from Last 3 Encounters:  10/10/22 120/70  10/03/22 (!) 113/59  04/14/22 120/70    Physical Exam Vitals and nursing note reviewed. Exam conducted with a chaperone present.  Constitutional:      General: She is not in acute distress.    Appearance: She is not diaphoretic.  HENT:     Head: Normocephalic and atraumatic.     Right Ear: Tympanic membrane and external ear normal.     Left Ear: Tympanic membrane and external ear normal.     Nose: Nose normal.     Mouth/Throat:     Mouth: Mucous membranes are moist.  Eyes:     General:        Right eye: No discharge.        Left eye: No discharge.     Conjunctiva/sclera: Conjunctivae normal.     Pupils: Pupils are equal, round, and reactive to light.  Neck:     Thyroid: No thyromegaly.  Cardiovascular:     Rate and Rhythm: Normal rate and regular rhythm.     Heart sounds: Normal heart sounds, S1 normal and S2 normal. No murmur heard.    No systolic murmur is present.     No diastolic murmur is present.     No friction rub. No gallop. No S3 or S4 sounds.  Pulmonary:     Effort: Pulmonary effort is normal.     Breath sounds: Normal breath sounds. No wheezing, rhonchi or rales.  Abdominal:     General: Bowel sounds are normal.     Palpations: Abdomen is soft. There is no mass.     Tenderness: There is no abdominal tenderness. There is no guarding.  Musculoskeletal:         General: Normal range of motion.     Cervical back: Neck supple.  Skin:    General: Skin is warm and dry.  Neurological:     Mental Status: She is alert.     Wt Readings from Last 3 Encounters:  10/10/22 131 lb (59.4 kg)  10/03/22 129 lb (58.5 kg)  04/14/22 130 lb (59 kg)    BP 120/70   Pulse 72   Ht 5\' 5"  (1.651 m)   Wt 131 lb (59.4 kg)   SpO2 97%   BMI 21.80 kg/m   Assessment and Plan:  1. Dehydration Follow-up from 126 emergency room evaluation for dehydration.  Patient is feeling significantly better since she received IV fluids and hydration has continued over the course of several days.  Patient has a tendency not to drink water and have gotten behind on fluid oral intake and now realizes that this probably contributed to the overall malaise hypotension and overall weakness.  Exam is consistent with normalization of hydration with blood pressure in excellent range at 120/70 and there is no orthostatic symptomatology.  Will obtain a renal function panel to determine if there is been improvement in the GFR and electrolyte status has stabilized. - Renal Function Panel    Otilio Miu, MD

## 2022-10-11 LAB — RENAL FUNCTION PANEL
Albumin: 4.2 g/dL (ref 3.7–4.7)
BUN/Creatinine Ratio: 10 — ABNORMAL LOW (ref 12–28)
BUN: 13 mg/dL (ref 8–27)
CO2: 22 mmol/L (ref 20–29)
Calcium: 9 mg/dL (ref 8.7–10.3)
Chloride: 104 mmol/L (ref 96–106)
Creatinine, Ser: 1.33 mg/dL — ABNORMAL HIGH (ref 0.57–1.00)
Glucose: 94 mg/dL (ref 70–99)
Phosphorus: 4.2 mg/dL (ref 3.0–4.3)
Potassium: 4 mmol/L (ref 3.5–5.2)
Sodium: 142 mmol/L (ref 134–144)
eGFR: 40 mL/min/{1.73_m2} — ABNORMAL LOW (ref >59)

## 2022-10-15 ENCOUNTER — Encounter: Payer: Self-pay | Admitting: Family Medicine

## 2022-10-15 ENCOUNTER — Ambulatory Visit (INDEPENDENT_AMBULATORY_CARE_PROVIDER_SITE_OTHER): Payer: Medicare HMO | Admitting: Family Medicine

## 2022-10-15 VITALS — BP 128/76 | HR 74 | Ht 65.0 in | Wt 134.0 lb

## 2022-10-15 DIAGNOSIS — E7800 Pure hypercholesterolemia, unspecified: Secondary | ICD-10-CM

## 2022-10-15 DIAGNOSIS — I10 Essential (primary) hypertension: Secondary | ICD-10-CM | POA: Diagnosis not present

## 2022-10-15 DIAGNOSIS — J441 Chronic obstructive pulmonary disease with (acute) exacerbation: Secondary | ICD-10-CM | POA: Diagnosis not present

## 2022-10-15 DIAGNOSIS — M1990 Unspecified osteoarthritis, unspecified site: Secondary | ICD-10-CM

## 2022-10-15 DIAGNOSIS — F5101 Primary insomnia: Secondary | ICD-10-CM | POA: Diagnosis not present

## 2022-10-15 DIAGNOSIS — J302 Other seasonal allergic rhinitis: Secondary | ICD-10-CM

## 2022-10-15 DIAGNOSIS — K219 Gastro-esophageal reflux disease without esophagitis: Secondary | ICD-10-CM

## 2022-10-15 MED ORDER — FAMOTIDINE 40 MG PO TABS: 40.0000 mg | ORAL_TABLET | Freq: Every day | ORAL | 1 refills | Status: DC

## 2022-10-15 MED ORDER — FLUTICASONE PROPIONATE 50 MCG/ACT NA SUSP: NASAL | 1 refills | Status: DC

## 2022-10-15 MED ORDER — FLUTICASONE-SALMETEROL 250-50 MCG/ACT IN AEPB: INHALATION_SPRAY | RESPIRATORY_TRACT | 1 refills | Status: DC

## 2022-10-15 MED ORDER — ATORVASTATIN CALCIUM 20 MG PO TABS: 20.0000 mg | ORAL_TABLET | Freq: Every day | ORAL | 1 refills | Status: DC

## 2022-10-15 MED ORDER — OMEPRAZOLE 20 MG PO CPDR: 20.0000 mg | DELAYED_RELEASE_CAPSULE | Freq: Every day | ORAL | 1 refills | Status: DC

## 2022-10-15 MED ORDER — MELOXICAM 7.5 MG PO TABS: ORAL_TABLET | ORAL | 3 refills | Status: DC

## 2022-10-15 MED ORDER — AMITRIPTYLINE HCL 10 MG PO TABS: 10.0000 mg | ORAL_TABLET | Freq: Every day | ORAL | 1 refills | Status: DC

## 2022-10-15 MED ORDER — CARVEDILOL 3.125 MG PO TABS: 3.1250 mg | ORAL_TABLET | Freq: Two times a day (BID) | ORAL | 1 refills | Status: DC

## 2022-10-15 NOTE — Progress Notes (Signed)
Date:  10/15/2022   Name:  Marie Villa   DOB:  [birth year]   MRN:  [id number]   Chief Complaint: Hypertension, Hyperlipidemia, Gastroesophageal Reflux, Allergic Rhinitis , Insomnia, and COPD  Hypertension This is a chronic problem. The current episode started more than 1 year ago. The problem has been gradually improving since onset. The problem is controlled. Pertinent negatives include no anxiety, blurred vision, chest pain, headaches, malaise/fatigue, neck pain, orthopnea, palpitations, peripheral edema, PND, shortness of breath or sweats. There are no associated agents to hypertension. Past treatments include ACE inhibitors. The current treatment provides moderate improvement. There are no compliance problems.  There is no history of angina, kidney disease, CAD/MI, CVA, heart failure, left ventricular hypertrophy, PVD or retinopathy. There is no history of chronic renal disease, a hypertension causing med or renovascular disease.  Hyperlipidemia This is a chronic problem. The current episode started more than 1 year ago. The problem is controlled. Recent lipid tests were reviewed and are normal. She has no history of chronic renal disease, diabetes, hypothyroidism, liver disease, obesity or nephrotic syndrome. There are no known factors aggravating her hyperlipidemia. Pertinent negatives include no chest pain, myalgias or shortness of breath. Current antihyperlipidemic treatment includes statins. The current treatment provides moderate improvement of lipids. There are no compliance problems.   Gastroesophageal Reflux She reports no abdominal pain, no chest pain, no coughing, no dysphagia, no hoarse voice, no nausea, no sore throat or no wheezing. This is a chronic problem. The problem has been gradually improving. The symptoms are aggravated by certain foods. Pertinent negatives include no fatigue. She has tried a PPI for the symptoms. The treatment provided moderate relief.  Insomnia Primary  symptoms: no malaise/fatigue.   The problem has been gradually improving since onset. The treatment provided moderate relief.  COPD There is no cough, difficulty breathing, frequent throat clearing, hoarse voice, shortness of breath or wheezing. This is a chronic problem. Pertinent negatives include no chest pain, ear pain, fever, headaches, malaise/fatigue, myalgias, nasal congestion, PND, rhinorrhea, sneezing, sore throat or sweats. Her past medical history is significant for COPD.    Lab Results  Component Value Date   NA 142 10/10/2022   K 4.0 10/10/2022   CO2 22 10/10/2022   GLUCOSE 94 10/10/2022   BUN 13 10/10/2022   CREATININE 1.33 (H) 10/10/2022   CALCIUM 9.0 10/10/2022   EGFR 40 (L) 10/10/2022   GFRNONAA 40 (L) 10/03/2022   Lab Results  Component Value Date   CHOL 180 04/14/2022   HDL 61 04/14/2022   LDLCALC 85 04/14/2022   TRIG 201 (H) 04/14/2022   CHOLHDL 3.1 10/12/2017   No results found for: "TSH" No results found for: "HGBA1C" Lab Results  Component Value Date   WBC 6.5 10/03/2022   HGB 10.6 (L) 10/03/2022   HCT 33.6 (L) 10/03/2022   MCV 103.1 (H) 10/03/2022   PLT 171 10/03/2022   Lab Results  Component Value Date   ALT 14 10/03/2022   AST 21 10/03/2022   ALKPHOS 83 10/03/2022   BILITOT 0.8 10/03/2022   No results found for: "25OHVITD2", "25OHVITD3", "VD25OH"   Review of Systems  Constitutional: Negative.  Negative for chills, fatigue, fever, malaise/fatigue and unexpected weight change.  HENT:  Negative for congestion, ear discharge, ear pain, hoarse voice, rhinorrhea, sinus pressure, sneezing and sore throat.   Eyes:  Negative for blurred vision.  Respiratory:  Negative for cough, shortness of breath, wheezing and stridor.   Cardiovascular:  Negative for  chest pain, palpitations, orthopnea and PND.  Gastrointestinal:  Negative for abdominal pain, blood in stool, constipation, diarrhea, dysphagia and nausea.  Genitourinary:  Negative for dysuria,  flank pain, frequency, hematuria, urgency and vaginal discharge.  Musculoskeletal:  Negative for arthralgias, back pain, myalgias and neck pain.  Skin:  Negative for rash.  Neurological:  Negative for dizziness, weakness and headaches.  Hematological:  Negative for adenopathy. Does not bruise/bleed easily.  Psychiatric/Behavioral:  Negative for dysphoric mood. The patient has insomnia. The patient is not nervous/anxious.     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   CCF (congestive cardiac failure) ([place]) 06/06/2015   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   Gastroesophageal reflux disease 06/06/2015   Hypercholesteremia 06/06/2015   Cardiac murmur 12/16/2013   SOB (shortness of breath) 12/16/2013    Allergies  Allergen Reactions   Sulfa Antibiotics Hives    Past Surgical History:  Procedure Laterality Date   CATARACT EXTRACTION, BILATERAL     COLONOSCOPY  2012   normal- cleared for 10- [facility]   HIP ARTHROPLASTY Left 12/20/2020   Procedure: ARTHROPLASTY BIPOLAR HIP (HEMIARTHROPLASTY);  Surgeon: Leim Fabry, MD;  Location: [facility] ORS;  Service: Orthopedics;  Laterality: Left;   VAGINAL HYSTERECTOMY      Social History   Tobacco Use   Smoking status: Former    Types: Cigarettes   Smokeless tobacco: Never   Tobacco comments:    quit over 50 years  Vaping Use   Vaping Use: Never used  Substance Use Topics   Alcohol use: Yes     Alcohol/week: 1.0 standard drink of alcohol    Types: 1 Cans of beer per week   Drug use: Never     Medication list has been reviewed and updated.  Current Meds  Medication Sig   acetaminophen (TYLENOL) 325 MG tablet Take 325 mg by mouth as needed.   amitriptyline (ELAVIL) 10 MG tablet Take 1 tablet (10 mg total) by mouth at bedtime.   atorvastatin (LIPITOR) 20 MG tablet Take 1 tablet (20 mg total) by mouth daily.   Calcium Carb-Cholecalciferol (CALCIUM-VITAMIN D) 600-400 MG-UNIT TABS Take 2 tablets by mouth daily.   carvedilol (COREG) 3.125 MG tablet Take 1 tablet (3.125 mg total) by mouth 2 (two) times daily.   famotidine (PEPCID) 40 MG tablet Take 1 tablet (40 mg total) by mouth daily.   ferrous sulfate 324 MG TBEC Take 324 mg by mouth daily.   fexofenadine (ALLEGRA) 180 MG tablet Take 180 mg by mouth daily.   fluticasone (FLONASE) 50 MCG/ACT nasal spray SHAKE LIQUID AND USE 1 SPRAY IN EACH NOSTRIL TWICE DAILY   fluticasone-salmeterol (WIXELA INHUB) 250-50 MCG/ACT AEPB INHALE 1 PUFF INTO THE LUNGS TWICE DAILY   Glucos-Chond-Hyal Ac-Ca Fructo (MOVE FREE JOINT HEALTH ADVANCE PO) Take 1 tablet by mouth daily.   meloxicam (MOBIC) 7.5 MG tablet TAKE 1 TABLET(7.5 MG) BY MOUTH DAILY   omeprazole (PRILOSEC) 20 MG capsule Take 1 capsule (20 mg total) by  mouth daily.   polyethylene glycol (MIRALAX / GLYCOLAX) 17 g packet Take 17 g by mouth daily.   ramipril (ALTACE) 2.5 MG capsule Take 2.5 mg by mouth daily.   senna-docusate (SENOKOT-S) 8.6-50 MG tablet Take 1 tablet by mouth at bedtime as needed for mild constipation.   traZODone (DESYREL) 50 MG tablet Take 50 mg by mouth at bedtime as needed for sleep. Dr. Clayborn Bigness   vitamin B-12 (CYANOCOBALAMIN) 1000 MCG tablet Take 1,000 mcg by mouth daily.   Zinc Sulfate (ZINC 15 PO) Take 1 tablet by mouth daily.       10/15/2022    8:46 AM 10/10/2022   10:37 AM 04/14/2022    8:08 AM 10/09/2021   10:10 AM  GAD 7 : Generalized Anxiety Score  Nervous,  Anxious, on Edge 0 0 0 0  Control/stop worrying 0 0 0 0  Worry too much - different things 0 0 0 0  Trouble relaxing 0 0 0 0  Restless 0 0 0 0  Easily annoyed or irritable 0 0 0 0  Afraid - awful might happen 0 0 0 0  Total GAD 7 Score 0 0 0 0  Anxiety Difficulty Not difficult at all Not difficult at all Not difficult at all Not difficult at all       10/15/2022    8:46 AM 10/10/2022   10:37 AM 04/14/2022    8:08 AM  Depression screen PHQ 2/9  Decreased Interest 0 0 0  Down, Depressed, Hopeless 0 0 0  PHQ - 2 Score 0 0 0  Altered sleeping 0 0 0  Tired, decreased energy 0 0 0  Change in appetite 0 0 0  Feeling bad or failure about yourself  0 0 0  Trouble concentrating 0 0 0  Moving slowly or fidgety/restless 0 0 0  Suicidal thoughts 0 0 0  PHQ-9 Score 0 0 0  Difficult doing work/chores Not difficult at all Not difficult at all Not difficult at all    BP Readings from Last 3 Encounters:  10/15/22 128/76  10/10/22 120/70  10/03/22 (!) 113/59    Physical Exam Vitals and nursing note reviewed. Exam conducted with a chaperone present.  Constitutional:      General: She is not in acute distress.    Appearance: She is not diaphoretic.  HENT:     Head: Normocephalic and atraumatic.     Right Ear: External ear normal.     Left Ear: External ear normal.     Nose: Nose normal.  Eyes:     General:        Right eye: No discharge.        Left eye: No discharge.     Conjunctiva/sclera: Conjunctivae normal.     Pupils: Pupils are equal, round, and reactive to light.  Neck:     Thyroid: No thyromegaly.     Vascular: No JVD.  Cardiovascular:     Rate and Rhythm: Normal rate and regular rhythm.     Heart sounds: Normal heart sounds. No murmur heard.    No friction rub. No gallop.  Pulmonary:     Effort: Pulmonary effort is normal.     Breath sounds: Normal breath sounds.  Abdominal:     General: Bowel sounds are normal.     Palpations: Abdomen is soft. There is no mass.      Tenderness: There is no abdominal tenderness. There is no guarding.  Musculoskeletal:  General: Normal range of motion.     Cervical back: Normal range of motion and neck supple.  Lymphadenopathy:     Cervical: No cervical adenopathy.  Skin:    General: Skin is warm and dry.  Neurological:     Mental Status: She is alert.     Wt Readings from Last 3 Encounters:  10/15/22 134 lb (60.8 kg)  10/10/22 131 lb (59.4 kg)  10/03/22 129 lb (58.5 kg)    BP 128/76   Pulse 74   Ht 5\' 5"  (1.651 m)   Wt 134 lb (60.8 kg)   SpO2 99%   BMI 22.30 kg/m   Assessment and Plan:  1. Essential hypertension Chronic.  Controlled.  Stable.  Blood pressure today is 128/76.  Continue carvedilol 3.125 twice a day and will recheck in 6 months. - carvedilol (COREG) 3.125 MG tablet; Take 1 tablet (3.125 mg total) by mouth 2 (two) times daily.  Dispense: 180 tablet; Refill: 1  2. Primary insomnia Chronic.  Controlled.  Stable.  Continue amitriptyline 10 mg nightly. - amitriptyline (ELAVIL) 10 MG tablet; Take 1 tablet (10 mg total) by mouth at bedtime.  Dispense: 90 tablet; Refill: 1  3. Hypercholesteremia Chronic.  Controlled.  Stable.  Continue atorvastatin 20 mg once a day. - atorvastatin (LIPITOR) 20 MG tablet; Take 1 tablet (20 mg total) by mouth daily.  Dispense: 90 tablet; Refill: 1  4. Gastroesophageal reflux disease Chronic.  Controlled.  Stable.  Continue famotidine 40 mg and omeprazole 20 mg on an alternating basis. - famotidine (PEPCID) 40 MG tablet; Take 1 tablet (40 mg total) by mouth daily.  Dispense: 90 tablet; Refill: 1 - omeprazole (PRILOSEC) 20 MG capsule; Take 1 capsule (20 mg total) by mouth daily.  Dispense: 90 capsule; Refill: 1  5. Other seasonal allergic rhinitis Chronic.  Controlled.  Stable.  Continue Flonase 1 spray each nostril twice a day. - fluticasone (FLONASE) 50 MCG/ACT nasal spray; SHAKE LIQUID AND USE 1 SPRAY IN EACH NOSTRIL TWICE DAILY  Dispense: 16 g; Refill:  1  6. Chronic obstructive pulmonary disease with acute exacerbation (HCC) Chronic.  Controlled.  Stable.  Continue Wixela 1 puff twice a day. - fluticasone-salmeterol (WIXELA INHUB) 250-50 MCG/ACT AEPB; INHALE 1 PUFF INTO THE LUNGS TWICE DAILY  Dispense: 60 each; Refill: 1  7. Arthritis Of caution patient wanted meloxicam called in which I have cautioned her to not to take on a daily basis but she is to use Tylenol given the effects on the kidneys patient seems to understand we will only use it on an absolute as needed basis once a day. - meloxicam (MOBIC) 7.5 MG tablet; TAKE 1 TABLET(7.5 MG) BY MOUTH DAILY  Dispense: 30 tablet; Refill: 3    Otilio Miu, MD

## 2022-11-14 ENCOUNTER — Telehealth: Payer: Self-pay | Admitting: Family Medicine

## 2022-11-14 NOTE — Telephone Encounter (Signed)
Contacted Marie Villa to schedule their annual wellness visit. Appointment made for 12/18/2022.  Sherol Dade; Care Guide Amb[facility] Clinical Support [facility] Direct Dial: [phone]

## 2022-11-14 NOTE — Telephone Encounter (Signed)
Copied from [place] [id number]. Topic: Medicare AWV >> Nov 14, 2022 10:21 AM Devoria Glassing wrote: Reason for CRM: Called patient to reschedule Medicare Annual Wellness Visit (AWV). No voicemail available to leave a message. New appt date 12/18/2022 at [facility].  Please confirm appt change date   Last date of AWV: 12/09/2021   Please schedule an appointment at any time with Kirke Shaggy, [place]  .  If any questions, please contact me.  Thank you ,  Sherol Dade; [place] Direct Dial: [phone]

## 2023-01-21 ENCOUNTER — Ambulatory Visit (INDEPENDENT_AMBULATORY_CARE_PROVIDER_SITE_OTHER): Payer: Medicare HMO

## 2023-01-21 VITALS — Ht 65.0 in | Wt 134.0 lb

## 2023-01-21 DIAGNOSIS — Z0000 Encounter for general adult medical examination without abnormal findings: Secondary | ICD-10-CM

## 2023-01-21 NOTE — Progress Notes (Signed)
I connected with  [name] on 01/21/23 by a audio enabled telemedicine application and verified that I am speaking with the correct person using two identifiers.  Patient Location: Home  Provider Location: Office/Clinic  I discussed the limitations of evaluation and management by telemedicine. The patient expressed understanding and agreed to proceed.  Subjective:   Marie Villa is a 82 y.o. female who presents for Medicare Annual (Subsequent) preventive examination.  Review of Systems     Cardiac Risk Factors include: advanced age (>79men, >39 women);dyslipidemia;hypertension     Objective:    There were no vitals filed for this visit. There is no height or weight on file to calculate BMI.     01/21/2023   11:01 AM 10/03/2022   10:45 AM 12/09/2021   11:15 AM 12/20/2020    2:00 AM 12/06/2020    9:30 AM 11/19/2020    9:36 AM 11/16/2019    9:29 AM  Advanced Directives  Does Patient Have a Medical Advance Directive? No No Yes Yes No Yes Yes  Type of Surveyor, minerals;Living will Living will  Healthcare Power of [place];Living will Healthcare Power of [place];Living will  Does patient want to make changes to medical advance directive?    No - Guardian declined     Copy of Healthcare Power of Attorney in Chart?   No - copy requested   No - copy requested No - copy requested  Would patient like information on creating a medical advance directive? No - Patient declined   No - Patient declined No - Patient declined      Current Medications (verified) Outpatient Encounter Medications as of 01/21/2023  Medication Sig   acetaminophen (TYLENOL) 325 MG tablet Take 325 mg by mouth as needed.   amitriptyline (ELAVIL) 10 MG tablet Take 1 tablet (10 mg total) by mouth at bedtime.   atorvastatin (LIPITOR) 20 MG tablet Take 1 tablet (20 mg total) by mouth daily.   Calcium Carb-Cholecalciferol (CALCIUM-VITAMIN D) 600-400 MG-UNIT TABS Take 2 tablets by mouth  daily.   carvedilol (COREG) 3.125 MG tablet Take 1 tablet (3.125 mg total) by mouth 2 (two) times daily.   cetirizine (ZYRTEC) 10 MG tablet TAKE 1 TABLET(10 MG) BY MOUTH DAILY   famotidine (PEPCID) 40 MG tablet Take 1 tablet (40 mg total) by mouth daily.   ferrous sulfate 324 MG TBEC Take 324 mg by mouth daily.   fexofenadine (ALLEGRA) 180 MG tablet Take 180 mg by mouth daily.   fluticasone (FLONASE) 50 MCG/ACT nasal spray SHAKE LIQUID AND USE 1 SPRAY IN EACH NOSTRIL TWICE DAILY   fluticasone-salmeterol (WIXELA INHUB) 250-50 MCG/ACT AEPB INHALE 1 PUFF INTO THE LUNGS TWICE DAILY   Glucos-Chond-Hyal Ac-Ca Fructo (MOVE FREE JOINT HEALTH ADVANCE PO) Take 1 tablet by mouth daily.   meloxicam (MOBIC) 7.5 MG tablet TAKE 1 TABLET(7.5 MG) BY MOUTH DAILY   omeprazole (PRILOSEC) 20 MG capsule Take 1 capsule (20 mg total) by mouth daily.   polyethylene glycol (MIRALAX / GLYCOLAX) 17 g packet Take 17 g by mouth daily.   ramipril (ALTACE) 2.5 MG capsule Take 2.5 mg by mouth daily.   senna-docusate (SENOKOT-S) 8.6-50 MG tablet Take 1 tablet by mouth at bedtime as needed for mild constipation.   traZODone (DESYREL) 50 MG tablet Take 50 mg by mouth at bedtime as needed for sleep. Dr. Juliann Pares   vitamin B-12 (CYANOCOBALAMIN) 1000 MCG tablet Take 1,000 mcg by mouth daily.   Zinc Sulfate (ZINC  15 PO) Take 1 tablet by mouth daily.   No facility-administered encounter medications on file as of 01/21/2023.    Allergies (verified) Sulfa antibiotics   History: Past Medical History:  Diagnosis Date   Congestive heart disease (HCC)    COPD (chronic obstructive pulmonary disease) (HCC)    GERD (gastroesophageal reflux disease)    Hyperlipidemia    Hypertension    Past Surgical History:  Procedure Laterality Date   CATARACT EXTRACTION, BILATERAL     COLONOSCOPY  2012   normal- cleared for 10- Iftikhar   HIP ARTHROPLASTY Left 12/20/2020   Procedure: ARTHROPLASTY BIPOLAR HIP (HEMIARTHROPLASTY);  Surgeon:  Signa Kell, MD;  Location: [facility] ORS;  Service: Orthopedics;  Laterality: Left;   VAGINAL HYSTERECTOMY     Family History  Problem Relation Age of Onset   Breast cancer Neg Hx    Social History   Socioeconomic History   Marital status: Married    Spouse name: Latianna Speake   Number of children: 2   Years of education: Not on file   Highest education level: Not on file  Occupational History   Occupation: Retired  Tobacco Use   Smoking status: Former    Types: Cigarettes   Smokeless tobacco: Never   Tobacco comments:    quit over 50 years  Vaping Use   Vaping Use: Never used  Substance and Sexual Activity   Alcohol use: Yes    Alcohol/week: 1.0 standard drink of alcohol    Types: 1 Cans of beer per week   Drug use: Never   Sexual activity: Yes  Other Topics Concern   Not on file  Social History Narrative   Not on file   Social Determinants of Health   Financial Resource Strain: Low Risk  (01/21/2023)   Overall Financial Resource Strain (CARDIA)    Difficulty of Paying Living Expenses: Not hard at all  Food Insecurity: No Food Insecurity (01/21/2023)   Hunger Vital Sign    Worried About Running Out of Food in the Last Year: Never true    Ran Out of Food in the Last Year: Never true  Transportation Needs: No Transportation Needs (01/21/2023)   PRAPARE - Administrator, Civil Service (Medical): No    Lack of Transportation (Non-Medical): No  Physical Activity: Insufficiently Active (01/21/2023)   Exercise Vital Sign    Days of Exercise per Week: 3 days    Minutes of Exercise per Session: 30 min  Stress: No Stress Concern Present (01/21/2023)   [employer] of Occupational Health - Occupational Stress Questionnaire    Feeling of Stress : Only a little  Social Connections: Moderately Integrated (01/21/2023)   Social Connection and Isolation Panel [NHANES]    Frequency of Communication with Friends and Family: More than three times a week    Frequency of  Social Gatherings with Friends and Family: Three times a week    Attends Religious Services: More than 4 times per year    Active Member of Clubs or Organizations: No    Attends Banker Meetings: Never    Marital Status: Married    Tobacco Counseling Counseling given: Not Answered Tobacco comments: quit over 50 years   Clinical Intake:  Pre-visit preparation completed: Yes  Pain : No/denies pain     Nutritional Risks: None Diabetes: No  How often do you need to have someone help you when you read instructions, pamphlets, or other written materials from your doctor or pharmacy?: 1 - Never  Diabetic?no  Interpreter Needed?: No  Information entered by :: Kennedy Bucker, LPN   Activities of Daily Living    01/21/2023   11:02 AM  In your present state of health, do you have any difficulty performing the following activities:  Hearing? 0  Vision? 0  Difficulty concentrating or making decisions? 0  Walking or climbing stairs? 1  Dressing or bathing? 0  Doing errands, shopping? 0  Preparing Food and eating ? N  Using the Toilet? N  In the past six months, have you accidently leaked urine? N  Do you have problems with loss of bowel control? N  Managing your Medications? N  Managing your Finances? N  Housekeeping or managing your Housekeeping? N    Patient Care Team: Duanne Limerick, MD as PCP - General (Family Medicine) Alwyn Pea, MD as Consulting Physician (Cardiology)  Indicate any recent Medical Services you may have received from other than [facility] providers in the past year (date may be approximate).     Assessment:   This is a routine wellness examination for [place].  Hearing/Vision screen Hearing Screening - Comments:: No aids Vision Screening - Comments:: Readers- Dr.Brasington  Dietary issues and exercise activities discussed: Current Exercise Habits: Home exercise routine, Type of exercise: walking, Time (Minutes): 30, Frequency  (Times/Week): 3, Weekly Exercise (Minutes/Week): 90, Intensity: Mild   Goals Addressed             This Visit's Progress    DIET - EAT MORE FRUITS AND VEGETABLES         Depression Screen    01/21/2023   10:59 AM 10/15/2022    8:46 AM 10/10/2022   10:37 AM 04/14/2022    8:08 AM 12/09/2021   11:14 AM 10/09/2021   10:09 AM 04/08/2021    9:01 AM  PHQ 2/9 Scores  PHQ - 2 Score 0 0 0 0 0 0 2  PHQ- 9 Score 0 0 0 0  0 4    Fall Risk    01/21/2023   11:02 AM 10/15/2022    8:45 AM 10/10/2022   10:40 AM 10/10/2022   10:37 AM 04/14/2022    8:08 AM  Fall Risk   Falls in the past year? 0 0 0 0 0  Number falls in past yr: 0 0 0 0 0  Injury with Fall? 0 0 0 0 0  Risk for fall due to : No Fall Risks No Fall Risks No Fall Risks No Fall Risks No Fall Risks  Follow up Falls prevention discussed;Falls evaluation completed Falls evaluation completed Falls evaluation completed Falls evaluation completed Falls evaluation completed    FALL RISK PREVENTION PERTAINING TO THE HOME:  Any stairs in or around the home? No  If so, are there any without handrails? No  Home free of loose throw rugs in walkways, pet beds, electrical cords, etc? Yes  Adequate lighting in your home to reduce risk of falls? Yes   ASSISTIVE DEVICES UTILIZED TO PREVENT FALLS:  Life alert? No  Use of a cane, walker or w/c? No  Grab bars in the bathroom? Yes  Shower chair or bench in shower? Yes  Elevated toilet seat or a handicapped toilet? Yes      Cognitive Function:        01/21/2023   11:05 AM 11/16/2019    9:32 AM  6CIT Screen  What Year? 0 points 0 points  What month? 0 points 0 points  What time? 0 points 0 points  Count back from 20 0 points 0 points  Months in reverse 0 points 0 points  Repeat phrase 0 points 2 points  Total Score 0 points 2 points    Immunizations Immunization History  Administered Date(s) Administered   Fluad Quad(high Dose 65+) 06/02/2019, 06/13/2020, 06/07/2021, 06/23/2022    Influenza,inj,Quad PF,6+ Mos 06/06/2015, 06/09/2016, 06/17/2017, 06/02/2018   Moderna Sars-Covid-2 Vaccination 09/20/2019, 10/19/2019, 04/08/2020   PNEUMOCOCCAL CONJUGATE-20 06/07/2021   Pneumococcal Conjugate-13 06/06/2015   Pneumococcal Polysaccharide-23 06/09/2016   Tdap 12/27/2012, 12/06/2020   Zoster Recombinat (Shingrix) 06/02/2018, 08/15/2018    TDAP status: Up to date  Flu Vaccine status: Up to date  Pneumococcal vaccine status: Up to date  Covid-19 vaccine status: Completed vaccines  Qualifies for Shingles Vaccine? Yes   Zostavax completed No   Shingrix Completed?: Yes  Screening Tests Health Maintenance  Topic Date Due   COVID-19 Vaccine (4 - 2023-24 season) 05/09/2022   INFLUENZA VACCINE  04/09/2023   MAMMOGRAM  06/24/2023   Medicare Annual Wellness (AWV)  01/21/2024   DTaP/Tdap/Td (3 - Td or Tdap) 12/07/2030   Pneumonia Vaccine 67+ Years old  Completed   DEXA SCAN  Completed   Zoster Vaccines- Shingrix  Completed   HPV VACCINES  Aged Out    Health Maintenance  Health Maintenance Due  Topic Date Due   COVID-19 Vaccine (4 - 2023-24 season) 05/09/2022    Colorectal cancer screening: No longer required.   Mammogram status: No longer required due to age.  Declined BDS  Lung Cancer Screening: (Low Dose CT Chest recommended if Age 42-80 years, 30 pack-year currently smoking OR have quit w/in 15years.) does not qualify.    Additional Screening:  Hepatitis C Screening: does not qualify; Completed no  Vision Screening: Recommended annual ophthalmology exams for early detection of glaucoma and other disorders of the eye. Is the patient up to date with their annual eye exam?  Yes  Who is the provider or what is the name of the office in which the patient attends annual eye exams? Dr.Brasington If pt is not established with a provider, would they like to be referred to a provider to establish care? No .   Dental Screening: Recommended annual dental exams for  proper oral hygiene  Community Resource Referral / Chronic Care Management: CRR required this visit?  No   CCM required this visit?  No      Plan:     I have personally reviewed and noted the following in the patient's chart:   Medical and social history Use of alcohol, tobacco or illicit drugs  Current medications and supplements including opioid prescriptions. Patient is not currently taking opioid prescriptions. Functional ability and status Nutritional status Physical activity Advanced directives List of other physicians Hospitalizations, surgeries, and ER visits in previous 12 months Vitals Screenings to include cognitive, depression, and falls Referrals and appointments  In addition, I have reviewed and discussed with patient certain preventive protocols, quality metrics, and best practice recommendations. A written personalized care plan for preventive services as well as general preventive health recommendations were provided to patient.     Hal Hope, LPN   1/61/0960   Nurse Notes: none

## 2023-01-21 NOTE — Patient Instructions (Signed)
[name] , Thank you for taking time to come for your Medicare Wellness Visit. I appreciate your ongoing commitment to your health goals. Please review the following plan we discussed and let me know if I can assist you in the future.   These are the goals we discussed:  Goals      DIET - EAT MORE FRUITS AND VEGETABLES     DIET - INCREASE WATER INTAKE     Recommend drinking 6-8 glasses of water per day      Increase physical activity     Pt would like to increase physical activity and start walking again up to 3 days per week        This is a list of the screening recommended for you and due dates:  Health Maintenance  Topic Date Due   COVID-19 Vaccine (4 - 2023-24 season) 05/09/2022   Flu Shot  04/09/2023   Mammogram  06/24/2023   Medicare Annual Wellness Visit  01/21/2024   DTaP/Tdap/Td vaccine (3 - Td or Tdap) 12/07/2030   Pneumonia Vaccine  Completed   DEXA scan (bone density measurement)  Completed   Zoster (Shingles) Vaccine  Completed   HPV Vaccine  Aged Out    Advanced directives: no  Conditions/risks identified: none  Next appointment: Follow up in one year for your annual wellness visit 01/27/24 @ 10:45 am by phone   Preventive Care 65 Years and Older, Female Preventive care refers to lifestyle choices and visits with your health care provider that can promote health and wellness. What does preventive care include? A yearly physical exam. This is also called an annual well check. Dental exams once or twice a year. Routine eye exams. Ask your health care provider how often you should have your eyes checked. Personal lifestyle choices, including: Daily care of your teeth and gums. Regular physical activity. Eating a healthy diet. Avoiding tobacco and drug use. Limiting alcohol use. Practicing safe sex. Taking low-dose aspirin every day. Taking vitamin and mineral supplements as recommended by your health care provider. What happens during an annual well  check? The services and screenings done by your health care provider during your annual well check will depend on your age, overall health, lifestyle risk factors, and family history of disease. Counseling  Your health care provider may ask you questions about your: Alcohol use. Tobacco use. Drug use. Emotional well-being. Home and relationship well-being. Sexual activity. Eating habits. History of falls. Memory and ability to understand (cognition). Work and work Astronomer. Reproductive health. Screening  You may have the following tests or measurements: Height, weight, and BMI. Blood pressure. Lipid and cholesterol levels. These may be checked every 5 years, or more frequently if you are over 62 years old. Skin check. Lung cancer screening. You may have this screening every year starting at age 69 if you have a 30-pack-year history of smoking and currently smoke or have quit within the past 15 years. Fecal occult blood test (FOBT) of the stool. You may have this test every year starting at age 48. Flexible sigmoidoscopy or colonoscopy. You may have a sigmoidoscopy every 5 years or a colonoscopy every 10 years starting at age 14. Hepatitis C blood test. Hepatitis B blood test. Sexually transmitted disease (STD) testing. Diabetes screening. This is done by checking your blood sugar (glucose) after you have not eaten for a while (fasting). You may have this done every 1-3 years. Bone density scan. This is done to screen for osteoporosis. You may have  this done starting at age 80. Mammogram. This may be done every 1-2 years. Talk to your health care provider about how often you should have regular mammograms. Talk with your health care provider about your test results, treatment options, and if necessary, the need for more tests. Vaccines  Your health care provider may recommend certain vaccines, such as: Influenza vaccine. This is recommended every year. Tetanus, diphtheria, and  acellular pertussis (Tdap, Td) vaccine. You may need a Td booster every 10 years. Zoster vaccine. You may need this after age 70. Pneumococcal 13-valent conjugate (PCV13) vaccine. One dose is recommended after age 65. Pneumococcal polysaccharide (PPSV23) vaccine. One dose is recommended after age 72. Talk to your health care provider about which screenings and vaccines you need and how often you need them. This information is not intended to replace advice given to you by your health care provider. Make sure you discuss any questions you have with your health care provider. Document Released: 09/21/2015 Document Revised: 05/14/2016 Document Reviewed: 06/26/2015 Elsevier Interactive Patient Education  2017 [employer] Prevention in the Home Falls can cause injuries. They can happen to people of all ages. There are many things you can do to make your home safe and to help prevent falls. What can I do on the outside of my home? Regularly fix the edges of walkways and driveways and fix any cracks. Remove anything that might make you trip as you walk through a door, such as a raised step or threshold. Trim any bushes or trees on the path to your home. Use bright outdoor lighting. Clear any walking paths of anything that might make someone trip, such as rocks or tools. Regularly check to see if handrails are loose or broken. Make sure that both sides of any steps have handrails. Any raised decks and porches should have guardrails on the edges. Have any leaves, snow, or ice cleared regularly. Use sand or salt on walking paths during winter. Clean up any spills in your garage right away. This includes oil or grease spills. What can I do in the bathroom? Use night lights. Install grab bars by the toilet and in the tub and shower. Do not use towel bars as grab bars. Use non-skid mats or decals in the tub or shower. If you need to sit down in the shower, use a plastic, non-slip stool. Keep  the floor dry. Clean up any water that spills on the floor as soon as it happens. Remove soap buildup in the tub or shower regularly. Attach bath mats securely with double-sided non-slip rug tape. Do not have throw rugs and other things on the floor that can make you trip. What can I do in the bedroom? Use night lights. Make sure that you have a light by your bed that is easy to reach. Do not use any sheets or blankets that are too big for your bed. They should not hang down onto the floor. Have a firm chair that has side arms. You can use this for support while you get dressed. Do not have throw rugs and other things on the floor that can make you trip. What can I do in the kitchen? Clean up any spills right away. Avoid walking on wet floors. Keep items that you use a lot in easy-to-reach places. If you need to reach something above you, use a strong step stool that has a grab bar. Keep electrical cords out of the way. Do not use floor polish or  wax that makes floors slippery. If you must use wax, use non-skid floor wax. Do not have throw rugs and other things on the floor that can make you trip. What can I do with my stairs? Do not leave any items on the stairs. Make sure that there are handrails on both sides of the stairs and use them. Fix handrails that are broken or loose. Make sure that handrails are as long as the stairways. Check any carpeting to make sure that it is firmly attached to the stairs. Fix any carpet that is loose or worn. Avoid having throw rugs at the top or bottom of the stairs. If you do have throw rugs, attach them to the floor with carpet tape. Make sure that you have a light switch at the top of the stairs and the bottom of the stairs. If you do not have them, ask someone to add them for you. What else can I do to help prevent falls? Wear shoes that: Do not have high heels. Have rubber bottoms. Are comfortable and fit you well. Are closed at the toe. Do not  wear sandals. If you use a stepladder: Make sure that it is fully opened. Do not climb a closed stepladder. Make sure that both sides of the stepladder are locked into place. Ask someone to hold it for you, if possible. Clearly mark and make sure that you can see: Any grab bars or handrails. First and last steps. Where the edge of each step is. Use tools that help you move around (mobility aids) if they are needed. These include: Canes. Walkers. Scooters. Crutches. Turn on the lights when you go into a dark area. Replace any light bulbs as soon as they burn out. Set up your furniture so you have a clear path. Avoid moving your furniture around. If any of your floors are uneven, fix them. If there are any pets around you, be aware of where they are. Review your medicines with your doctor. Some medicines can make you feel dizzy. This can increase your chance of falling. Ask your doctor what other things that you can do to help prevent falls. This information is not intended to replace advice given to you by your health care provider. Make sure you discuss any questions you have with your health care provider. Document Released: 06/21/2009 Document Revised: 01/31/2016 Document Reviewed: 09/29/2014 Elsevier Interactive Patient Education  2017 [employer].

## 2023-02-04 ENCOUNTER — Other Ambulatory Visit: Payer: Self-pay

## 2023-02-04 DIAGNOSIS — J302 Other seasonal allergic rhinitis: Secondary | ICD-10-CM

## 2023-02-04 MED ORDER — FLUTICASONE PROPIONATE 50 MCG/ACT NA SUSP: NASAL | 0 refills | Status: DC

## 2023-02-11 DIAGNOSIS — I130 Hypertensive heart and chronic kidney disease with heart failure and stage 1 through stage 4 chronic kidney disease, or unspecified chronic kidney disease: Secondary | ICD-10-CM | POA: Diagnosis not present

## 2023-02-11 DIAGNOSIS — K219 Gastro-esophageal reflux disease without esophagitis: Secondary | ICD-10-CM | POA: Diagnosis not present

## 2023-02-11 DIAGNOSIS — R0602 Shortness of breath: Secondary | ICD-10-CM | POA: Diagnosis not present

## 2023-02-11 DIAGNOSIS — I429 Cardiomyopathy, unspecified: Secondary | ICD-10-CM | POA: Diagnosis not present

## 2023-02-11 DIAGNOSIS — N189 Chronic kidney disease, unspecified: Secondary | ICD-10-CM | POA: Diagnosis not present

## 2023-02-11 DIAGNOSIS — I10 Essential (primary) hypertension: Secondary | ICD-10-CM | POA: Diagnosis not present

## 2023-02-11 DIAGNOSIS — I509 Heart failure, unspecified: Secondary | ICD-10-CM | POA: Diagnosis not present

## 2023-02-11 DIAGNOSIS — J449 Chronic obstructive pulmonary disease, unspecified: Secondary | ICD-10-CM | POA: Diagnosis not present

## 2023-02-16 ENCOUNTER — Other Ambulatory Visit: Payer: Self-pay

## 2023-02-16 DIAGNOSIS — J302 Other seasonal allergic rhinitis: Secondary | ICD-10-CM

## 2023-02-16 DIAGNOSIS — M1990 Unspecified osteoarthritis, unspecified site: Secondary | ICD-10-CM

## 2023-02-16 DIAGNOSIS — E7800 Pure hypercholesterolemia, unspecified: Secondary | ICD-10-CM

## 2023-02-16 DIAGNOSIS — F5101 Primary insomnia: Secondary | ICD-10-CM

## 2023-02-16 DIAGNOSIS — I10 Essential (primary) hypertension: Secondary | ICD-10-CM

## 2023-02-16 DIAGNOSIS — K219 Gastro-esophageal reflux disease without esophagitis: Secondary | ICD-10-CM

## 2023-02-16 MED ORDER — MELOXICAM 7.5 MG PO TABS: ORAL_TABLET | ORAL | 3 refills | Status: DC

## 2023-02-16 MED ORDER — ATORVASTATIN CALCIUM 20 MG PO TABS: 20.0000 mg | ORAL_TABLET | Freq: Every day | ORAL | 0 refills | Status: DC

## 2023-02-16 MED ORDER — FLUTICASONE PROPIONATE 50 MCG/ACT NA SUSP: NASAL | 0 refills | Status: DC

## 2023-04-16 ENCOUNTER — Telehealth: Payer: Self-pay | Admitting: Family Medicine

## 2023-04-16 ENCOUNTER — Ambulatory Visit: Payer: Medicare HMO | Admitting: Family Medicine

## 2023-04-16 NOTE — Telephone Encounter (Signed)
Copied from CRM [id number]. Topic: General - Call Back - No Documentation >> Apr 16, 2023  9:25 AM Franchot Heidelberg wrote: Reason for CRM: Pt called reporting a missed call from the office this morning, no VM left. Nothing in chart

## 2023-04-22 ENCOUNTER — Telehealth: Payer: Self-pay | Admitting: Family Medicine

## 2023-04-22 NOTE — Telephone Encounter (Signed)
Copied from CRM [id number]. Topic: General - Inquiry >> Apr 22, 2023  8:21 AM Marie Villa wrote: Patient stated she had a missed call and no message from anyone. Patient does have an appt tomorrow 04/23/23 with PCP, advised patient it could of been an appt reminder call. Please advise.

## 2023-04-23 ENCOUNTER — Ambulatory Visit (INDEPENDENT_AMBULATORY_CARE_PROVIDER_SITE_OTHER): Payer: Medicare HMO | Admitting: Family Medicine

## 2023-04-23 ENCOUNTER — Encounter: Payer: Self-pay | Admitting: Family Medicine

## 2023-04-23 VITALS — BP 128/86 | HR 78 | Ht 65.0 in | Wt 131.0 lb

## 2023-04-23 DIAGNOSIS — E7800 Pure hypercholesterolemia, unspecified: Secondary | ICD-10-CM | POA: Diagnosis not present

## 2023-04-23 DIAGNOSIS — I10 Essential (primary) hypertension: Secondary | ICD-10-CM

## 2023-04-23 DIAGNOSIS — K219 Gastro-esophageal reflux disease without esophagitis: Secondary | ICD-10-CM | POA: Diagnosis not present

## 2023-04-23 DIAGNOSIS — F5101 Primary insomnia: Secondary | ICD-10-CM

## 2023-04-23 DIAGNOSIS — I130 Hypertensive heart and chronic kidney disease with heart failure and stage 1 through stage 4 chronic kidney disease, or unspecified chronic kidney disease: Secondary | ICD-10-CM | POA: Diagnosis not present

## 2023-04-23 DIAGNOSIS — J302 Other seasonal allergic rhinitis: Secondary | ICD-10-CM

## 2023-04-23 DIAGNOSIS — I509 Heart failure, unspecified: Secondary | ICD-10-CM | POA: Diagnosis not present

## 2023-04-23 MED ORDER — ATORVASTATIN CALCIUM 20 MG PO TABS: 20.0000 mg | ORAL_TABLET | Freq: Every day | ORAL | 1 refills | Status: AC

## 2023-04-23 MED ORDER — CARVEDILOL 3.125 MG PO TABS: 3.1250 mg | ORAL_TABLET | Freq: Two times a day (BID) | ORAL | 1 refills | Status: AC

## 2023-04-23 MED ORDER — FAMOTIDINE 40 MG PO TABS: 40.0000 mg | ORAL_TABLET | Freq: Every day | ORAL | 1 refills | Status: AC

## 2023-04-23 MED ORDER — FLUTICASONE PROPIONATE 50 MCG/ACT NA SUSP: NASAL | 0 refills | Status: DC

## 2023-04-23 MED ORDER — AMITRIPTYLINE HCL 10 MG PO TABS: 10.0000 mg | ORAL_TABLET | Freq: Every day | ORAL | 1 refills | Status: DC

## 2023-04-23 MED ORDER — ATORVASTATIN CALCIUM 20 MG PO TABS: 20.0000 mg | ORAL_TABLET | Freq: Every day | ORAL | 0 refills | Status: DC

## 2023-04-23 MED ORDER — OMEPRAZOLE 20 MG PO CPDR: 20.0000 mg | DELAYED_RELEASE_CAPSULE | Freq: Every day | ORAL | 1 refills | Status: AC

## 2023-04-23 MED ORDER — CETIRIZINE HCL 10 MG PO TABS: 10.0000 mg | ORAL_TABLET | Freq: Every day | ORAL | 1 refills | Status: AC

## 2023-04-23 NOTE — Progress Notes (Signed)
Date:  04/23/2023   Name:  Marie Villa   DOB:  October 26, 1940   MRN:  [id number]   Chief Complaint: Gastroesophageal Reflux, Hyperlipidemia, Allergic Rhinitis , Hypertension, and Insomnia  Gastroesophageal Reflux She reports no abdominal pain, no chest pain, no coughing, no dysphagia, no heartburn, no nausea, no sore throat or no wheezing. This is a chronic problem. The current episode started more than 1 year ago. The problem has been gradually improving. Nothing aggravates the symptoms. Pertinent negatives include no fatigue, melena or weight loss. She has tried a PPI and a histamine-2 antagonist for the symptoms. The treatment provided moderate relief.  Hyperlipidemia This is a chronic problem. The current episode started more than 1 year ago. The problem is controlled. She has no history of chronic renal disease. Pertinent negatives include no chest pain, focal sensory loss, focal weakness, leg pain, myalgias or shortness of breath. Current antihyperlipidemic treatment includes statins. The current treatment provides moderate improvement of lipids. There are no compliance problems.   Hypertension This is a chronic problem. The current episode started more than 1 year ago. The problem has been gradually improving since onset. The problem is controlled. Pertinent negatives include no blurred vision, chest pain, headaches, orthopnea, palpitations, peripheral edema or shortness of breath. There are no associated agents to hypertension. Risk factors for coronary artery disease include dyslipidemia. Past treatments include alpha 1 blockers and beta blockers. The current treatment provides moderate improvement. There is no history of CAD/MI or CVA. There is no history of chronic renal disease, a hypertension causing med or renovascular disease.  Insomnia Primary symptoms: no difficulty falling asleep, no frequent awakening.   The current episode started more than one year. The problem has been gradually  improving since onset.    Lab Results  Component Value Date   NA 142 10/10/2022   K 4.0 10/10/2022   CO2 22 10/10/2022   GLUCOSE 94 10/10/2022   BUN 13 10/10/2022   CREATININE 1.33 (H) 10/10/2022   CALCIUM 9.0 10/10/2022   EGFR 40 (L) 10/10/2022   GFRNONAA 40 (L) 10/03/2022   Lab Results  Component Value Date   CHOL 180 04/14/2022   HDL 61 04/14/2022   LDLCALC 85 04/14/2022   TRIG 201 (H) 04/14/2022   CHOLHDL 3.1 10/12/2017   No results found for: "TSH" No results found for: "HGBA1C" Lab Results  Component Value Date   WBC 6.5 10/03/2022   HGB 10.6 (L) 10/03/2022   HCT 33.6 (L) 10/03/2022   MCV 103.1 (H) 10/03/2022   PLT 171 10/03/2022   Lab Results  Component Value Date   ALT 14 10/03/2022   AST 21 10/03/2022   ALKPHOS 83 10/03/2022   BILITOT 0.8 10/03/2022   No results found for: "25OHVITD2", "25OHVITD3", "VD25OH"   Review of Systems  Constitutional:  Negative for diaphoresis, fatigue, fever and weight loss.  HENT:  Negative for congestion and sore throat.   Eyes:  Negative for blurred vision.  Respiratory:  Negative for cough, shortness of breath and wheezing.   Cardiovascular:  Negative for chest pain, palpitations and orthopnea.  Gastrointestinal:  Negative for abdominal pain, blood in stool, diarrhea, dysphagia, heartburn, melena and nausea.  Endocrine: Negative for polydipsia and polyuria.  Genitourinary:  Negative for difficulty urinating.  Musculoskeletal:  Negative for myalgias.  Neurological:  Negative for focal weakness and headaches.  Psychiatric/Behavioral:  The patient has insomnia.     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/06/2015   CCF (congestive cardiac failure) (HCC) 06/06/2015   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/06/2015   Gastroesophageal reflux disease 06/06/2015   Hypercholesteremia 06/06/2015   Cardiac murmur 12/16/2013   SOB (shortness of breath) 12/16/2013    Allergies  Allergen Reactions   Sulfa Antibiotics Hives    Past Surgical History:  Procedure Laterality Date   CATARACT EXTRACTION, BILATERAL     COLONOSCOPY  2012   normal- cleared for 10- Iftikhar   HIP ARTHROPLASTY Left 12/20/2020   Procedure: ARTHROPLASTY BIPOLAR HIP (HEMIARTHROPLASTY);  Surgeon: Signa Kell, MD;  Location: [facility]S;  Service: Orthopedics;  Laterality: Left;   VAGINAL HYSTERECTOMY      Social History   Tobacco Use   Smoking status: Former    Types: Cigarettes   Smokeless tobacco: Never   Tobacco comments:    quit over 50 years  Vaping Use   Vaping status: Never Used  Substance Use Topics   Alcohol use: Yes    Alcohol/week: 1.0 standard drink of alcohol    Types: 1 Cans of beer per week   Drug use: Never     Medication list has been reviewed and updated.  Current Meds  Medication Sig   acetaminophen (TYLENOL) 325 MG tablet Take 325 mg by mouth as needed.   atorvastatin (LIPITOR) 20 MG tablet Take 1 tablet (20 mg total) by mouth daily.   Calcium Carb-Cholecalciferol (CALCIUM-VITAMIN D) 600-400 MG-UNIT TABS Take 2 tablets by mouth daily.   carvedilol (COREG) 3.125 MG tablet Take 1 tablet (3.125 mg total) by mouth 2 (two) times daily.   cetirizine (ZYRTEC) 10 MG tablet TAKE 1 TABLET(10 MG) BY MOUTH DAILY   famotidine (PEPCID) 40 MG tablet Take 1 tablet (40 mg total) by mouth daily.   ferrous sulfate 324 MG TBEC Take 324 mg by mouth daily.   fexofenadine (ALLEGRA) 180 MG  tablet Take 180 mg by mouth daily.   fluticasone (FLONASE) 50 MCG/ACT nasal spray SHAKE LIQUID AND USE 1 SPRAY IN EACH NOSTRIL TWICE DAILY   fluticasone-salmeterol (WIXELA INHUB) 250-50 MCG/ACT AEPB INHALE 1 PUFF INTO THE LUNGS TWICE DAILY   Glucos-Chond-Hyal Ac-Ca Fructo (MOVE FREE JOINT HEALTH ADVANCE PO) Take 1 tablet by mouth daily.   meloxicam (MOBIC) 7.5 MG tablet TAKE 1 TABLET(7.5 MG) BY MOUTH DAILY   omeprazole (PRILOSEC) 20 MG capsule Take 1 capsule (20 mg total) by mouth daily.   polyethylene glycol (MIRALAX / GLYCOLAX) 17 g packet Take 17 g by mouth daily.   ramipril (ALTACE) 2.5 MG capsule Take 2.5 mg by mouth daily.   senna-docusate (SENOKOT-S) 8.6-50 MG tablet Take 1 tablet by mouth at bedtime as needed for mild constipation.   vitamin B-12 (CYANOCOBALAMIN) 1000 MCG tablet Take 1,000 mcg by mouth daily.   Zinc Sulfate (ZINC 15 PO) Take 1 tablet by mouth daily.   [DISCONTINUED] traZODone (DESYREL) 50 MG tablet Take 50 mg by mouth at bedtime as needed for sleep. Dr. Juliann Pares       04/23/2023  9:09 AM 10/15/2022    8:46 AM 10/10/2022   10:37 AM 04/14/2022    8:08 AM  GAD 7 : Generalized Anxiety Score  Nervous, Anxious, on Edge 0 0 0 0  Control/stop worrying 0 0 0 0  Worry too much - different things 0 0 0 0  Trouble relaxing 0 0 0 0  Restless 0 0 0 0  Easily annoyed or irritable 0 0 0 0  Afraid - awful might happen 0 0 0 0  Total GAD 7 Score 0 0 0 0  Anxiety Difficulty Not difficult at all Not difficult at all Not difficult at all Not difficult at all       04/23/2023    9:09 AM 01/21/2023   10:59 AM 10/15/2022    8:46 AM  Depression screen PHQ 2/9  Decreased Interest 0 0 0  Down, Depressed, Hopeless 0 0 0  PHQ - 2 Score 0 0 0  Altered sleeping 0 0 0  Tired, decreased energy 0 0 0  Change in appetite 0 0 0  Feeling bad or failure about yourself  0 0 0  Trouble concentrating 0 0 0  Moving slowly or fidgety/restless 0 0 0  Suicidal thoughts 0 0 0  PHQ-9 Score 0 0 0   Difficult doing work/chores Not difficult at all Not difficult at all Not difficult at all    BP Readings from Last 3 Encounters:  04/23/23 128/86  10/15/22 128/76  10/10/22 120/70    Physical Exam Vitals and nursing note reviewed. Exam conducted with a chaperone present.  Constitutional:      General: She is not in acute distress.    Appearance: She is not diaphoretic.  HENT:     Head: Normocephalic and atraumatic.     Right Ear: External ear normal.     Left Ear: External ear normal.     Nose: Nose normal. No congestion or rhinorrhea.     Mouth/Throat:     Mouth: Mucous membranes are moist.  Eyes:     General:        Right eye: No discharge.        Left eye: No discharge.     Conjunctiva/sclera: Conjunctivae normal.     Pupils: Pupils are equal, round, and reactive to light.  Neck:     Thyroid: No thyromegaly.     Vascular: No JVD.  Cardiovascular:     Rate and Rhythm: Normal rate and regular rhythm.     Heart sounds: Normal heart sounds. No murmur heard.    No friction rub. No gallop.  Pulmonary:     Effort: Pulmonary effort is normal.     Breath sounds: Normal breath sounds. No wheezing, rhonchi or rales.  Abdominal:     General: Bowel sounds are normal.     Palpations: Abdomen is soft. There is no mass.     Tenderness: There is no abdominal tenderness. There is no guarding.  Musculoskeletal:        General: Normal range of motion.     Cervical back: Normal range of motion and neck supple.  Lymphadenopathy:     Cervical: No cervical adenopathy.  Skin:    General: Skin is warm and dry.     Findings: No bruising.  Neurological:     Mental Status: She is alert.     Deep Tendon Reflexes: Reflexes are normal and symmetric.     Wt Readings from Last 3 Encounters:  04/23/23 131 lb (59.4 kg)  01/21/23 134 lb (60.8 kg)  10/15/22 134 lb (60.8 kg)    BP 128/86   Pulse 78   Ht 5\' 5"  (1.651 m)   Wt 131 lb (59.4 kg)   SpO2 97%   BMI 21.80 kg/m   Assessment  and Plan: 1. Primary insomnia Chronic.  Controlled.  Stable.  Continue amitriptyline 10 mg nightly. - amitriptyline (ELAVIL) 10 MG tablet; Take 1 tablet (10 mg total) by mouth at bedtime.  Dispense: 90 tablet; Refill: 1  2. Hypercholesteremia .  Controlled.  Stable.  Asymptomatic without myalgias.  Will check lipid panel for current level of control.  Will refill atorvastatin 20 mg once a day.  Will recheck in 6 months. - atorvastatin (LIPITOR) 20 MG tablet; Take 1 tablet (20 mg total) by mouth daily.  Dispense: 90 tablet; Refill: 1 - Lipid Panel With LDL/HDL Ratio  3. Essential hypertension Chronic.  Controlled.  Stable.  Blood pressure today is 128/86.  Asymptomatic.  Tolerating medication well.  Continue carvedilol 3.125 mg twice a day.  Will recheck lipid panel for electrolytes and GFR.  Will recheck patient in 6 months.- carvedilol (COREG) 3.125 MG tablet; Take 1 tablet (3.125 mg total) by mouth 2 (two) times daily.  Dispense: 180 tablet; Refill: 1 - Renal Function Panel  4. Other seasonal allergic rhinitis Chronic.  Episodic.  Seasonal.  Continue Zyrtec and Flonase on as-needed basis. - cetirizine (ZYRTEC) 10 MG tablet; Take 1 tablet (10 mg total) by mouth daily.  Dispense: 90 tablet; Refill: 1 - fluticasone (FLONASE) 50 MCG/ACT nasal spray; SHAKE LIQUID AND USE 1 SPRAY IN EACH NOSTRIL TWICE DAILY  Dispense: 16 g; Refill: 0  5. Gastroesophageal reflux disease Chronic.  Controlled.  Stable.  Patient has symptomatic control with Pepcid 40 mg once a day and omeprazole 20 mg once a day and will recheck patient in 6 months. - famotidine (PEPCID) 40 MG tablet; Take 1 tablet (40 mg total) by mouth daily.  Dispense: 90 tablet; Refill: 1 - omeprazole (PRILOSEC) 20 MG capsule; Take 1 capsule (20 mg total) by mouth daily.  Dispense: 90 capsule; Refill: 1    Elizabeth Sauer, MD

## 2023-04-24 ENCOUNTER — Other Ambulatory Visit: Payer: Self-pay

## 2023-04-24 LAB — RENAL FUNCTION PANEL
Albumin: 4.3 g/dL (ref 3.7–4.7)
BUN/Creatinine Ratio: 11 — ABNORMAL LOW (ref 12–28)
BUN: 16 mg/dL (ref 8–27)
CO2: 20 mmol/L (ref 20–29)
Calcium: 9.2 mg/dL (ref 8.7–10.3)
Chloride: 104 mmol/L (ref 96–106)
Creatinine, Ser: 1.46 mg/dL — ABNORMAL HIGH (ref 0.57–1.00)
Glucose: 104 mg/dL — ABNORMAL HIGH (ref 70–99)
Phosphorus: 3.3 mg/dL (ref 3.0–4.3)
Potassium: 4.5 mmol/L (ref 3.5–5.2)
Sodium: 139 mmol/L (ref 134–144)
eGFR: 35 mL/min/{1.73_m2} — ABNORMAL LOW (ref >59)

## 2023-04-24 LAB — LIPID PANEL WITH LDL/HDL RATIO
Cholesterol, Total: 163 mg/dL (ref 100–199)
HDL: 56 mg/dL (ref >39)
LDL Chol Calc (NIH): 73 mg/dL (ref 0–99)
LDL/HDL Ratio: 1.3 ratio (ref 0.0–3.2)
Triglycerides: 210 mg/dL — ABNORMAL HIGH (ref 0–149)
VLDL Cholesterol Cal: 34 mg/dL (ref 5–40)

## 2023-05-13 ENCOUNTER — Other Ambulatory Visit: Payer: Self-pay

## 2023-05-13 DIAGNOSIS — J441 Chronic obstructive pulmonary disease with (acute) exacerbation: Secondary | ICD-10-CM

## 2023-05-13 MED ORDER — FLUTICASONE-SALMETEROL 250-50 MCG/ACT IN AEPB: INHALATION_SPRAY | RESPIRATORY_TRACT | 1 refills | Status: DC

## 2023-05-15 ENCOUNTER — Other Ambulatory Visit: Payer: Self-pay

## 2023-05-15 DIAGNOSIS — J441 Chronic obstructive pulmonary disease with (acute) exacerbation: Secondary | ICD-10-CM

## 2023-05-15 DIAGNOSIS — J302 Other seasonal allergic rhinitis: Secondary | ICD-10-CM

## 2023-05-15 MED ORDER — FLUTICASONE-SALMETEROL 250-50 MCG/ACT IN AEPB: INHALATION_SPRAY | RESPIRATORY_TRACT | 1 refills | Status: DC

## 2023-05-15 MED ORDER — FLUTICASONE PROPIONATE 50 MCG/ACT NA SUSP: NASAL | 0 refills | Status: DC

## 2023-06-04 ENCOUNTER — Ambulatory Visit (INDEPENDENT_AMBULATORY_CARE_PROVIDER_SITE_OTHER): Payer: Medicare HMO

## 2023-06-04 DIAGNOSIS — R7989 Other specified abnormal findings of blood chemistry: Secondary | ICD-10-CM | POA: Diagnosis not present

## 2023-06-04 DIAGNOSIS — Z23 Encounter for immunization: Secondary | ICD-10-CM | POA: Diagnosis not present

## 2023-06-05 LAB — SPECIMEN STATUS REPORT

## 2023-06-05 LAB — RENAL FUNCTION PANEL
Albumin: 4.3 g/dL (ref 3.7–4.7)
BUN/Creatinine Ratio: 13 (ref 12–28)
BUN: 20 mg/dL (ref 8–27)
CO2: 20 mmol/L (ref 20–29)
Calcium: 9.4 mg/dL (ref 8.7–10.3)
Chloride: 107 mmol/L — ABNORMAL HIGH (ref 96–106)
Creatinine, Ser: 1.59 mg/dL — ABNORMAL HIGH (ref 0.57–1.00)
Glucose: 100 mg/dL — ABNORMAL HIGH (ref 70–99)
Phosphorus: 3.9 mg/dL (ref 3.0–4.3)
Potassium: 4.5 mmol/L (ref 3.5–5.2)
Sodium: 143 mmol/L (ref 134–144)
eGFR: 32 mL/min/{1.73_m2} — ABNORMAL LOW (ref >59)

## 2023-06-28 ENCOUNTER — Other Ambulatory Visit: Payer: Self-pay | Admitting: Family Medicine

## 2023-06-28 DIAGNOSIS — M1990 Unspecified osteoarthritis, unspecified site: Secondary | ICD-10-CM

## 2023-06-29 ENCOUNTER — Ambulatory Visit (INDEPENDENT_AMBULATORY_CARE_PROVIDER_SITE_OTHER): Payer: Medicare HMO

## 2023-06-29 ENCOUNTER — Ambulatory Visit
Admission: EM | Admit: 2023-06-29 | Discharge: 2023-06-29 | Disposition: A | Discharge status: Home / Self Care | Payer: Medicare HMO | Attending: Physician Assistant | Admitting: Physician Assistant

## 2023-06-29 DIAGNOSIS — M25521 Pain in right elbow: Secondary | ICD-10-CM

## 2023-06-29 DIAGNOSIS — I509 Heart failure, unspecified: Secondary | ICD-10-CM | POA: Diagnosis not present

## 2023-06-29 DIAGNOSIS — S42291A Other displaced fracture of upper end of right humerus, initial encounter for closed fracture: Secondary | ICD-10-CM

## 2023-06-29 DIAGNOSIS — W19XXXA Unspecified fall, initial encounter: Secondary | ICD-10-CM

## 2023-06-29 DIAGNOSIS — M25511 Pain in right shoulder: Secondary | ICD-10-CM | POA: Diagnosis not present

## 2023-06-29 NOTE — ED Provider Notes (Signed)
[facility]    CSN: [id number] Arrival date & time: 06/29/23  1032      History   Chief Complaint Chief Complaint  Patient presents with   Fall    HPI Marie Villa is a 82 y.o. female presenting with her husband for right shoulder pain following an injury that occurred last week, 5 days ago.  She reports that she stubbed her toe on something and fell onto her right shoulder.  She is having swelling of the shoulder with significant bruising of the right upper arm.  She reports some pain of the lateral upper arm.  No pain of elbow, forearm, hand.  Denies numbness, tingling or weakness.  Difficulty raising arm overhead or reaching behind back.  Pain has improved a little bit from onset.  She has been taking ibuprofen.  She is right-handed.  She denies head injury or loss conscious.  No other complaints.  Medical history significant for hypertension, hyperlipidemia, GERD, COPD, CHF.  HPI  Past Medical History:  Diagnosis Date   Congestive heart disease (HCC)    COPD (chronic obstructive pulmonary disease) (HCC)    GERD (gastroesophageal reflux disease)    Hyperlipidemia    Hypertension     Patient Active Problem List   Diagnosis Date Noted   History of anemia 10/09/2021   Closed left hip fracture (HCC) 12/19/2020   Head trauma 12/17/2020   Visit for suture removal 12/17/2020   Primary insomnia 10/22/2020   Left knee pain 11/25/2018   Primary osteoarthritis of left knee 11/25/2018   Essential hypertension 10/12/2017   COPD (chronic obstructive pulmonary disease) (HCC) 04/10/2017   Simple chronic bronchitis (HCC) 06/09/2016   Other seasonal allergic rhinitis 06/09/2016   Cardiomyopathy (HCC) 06/06/2015   Chronic kidney disease, stage 3b (HCC) 06/06/2015   CCF (congestive cardiac failure) (HCC) 06/06/2015   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      Home Medications    Prior to Admission medications   Medication Sig Start Date End Date Taking? Authorizing Provider  acetaminophen (TYLENOL) 325 MG tablet Take 325 mg by mouth as needed.   Yes [provider]  atorvastatin (LIPITOR) 20 MG tablet Take 1 tablet (20 mg total) by mouth daily. 04/23/23  Yes Duanne Limerick, MD  Calcium Carb-Cholecalciferol (CALCIUM-VITAMIN D) 600-400 MG-UNIT TABS Take 2 tablets by mouth daily.   Yes [provider]  carvedilol (COREG) 3.125 MG tablet Take 1 tablet (3.125 mg total) by mouth 2 (two) times daily. 04/23/23  Yes Duanne Limerick, MD  cetirizine (ZYRTEC) 10 MG tablet Take 1 tablet (10 mg total) by mouth daily. 04/23/23  Yes Duanne Limerick, MD  famotidine (PEPCID) 40 MG tablet Take 1 tablet (40 mg total) by mouth daily. 04/23/23  Yes Duanne Limerick, MD  ferrous sulfate 324 MG TBEC Take 324 mg by mouth daily.   Yes [provider]  fexofenadine (ALLEGRA) 180 MG  tablet Take 180 mg by mouth daily.   Yes [provider]  fluticasone (FLONASE) 50 MCG/ACT nasal spray SHAKE LIQUID AND USE 1 SPRAY IN EACH NOSTRIL TWICE DAILY 05/15/23  Yes Duanne Limerick, MD  fluticasone-salmeterol (WIXELA INHUB) 250-50 MCG/ACT AEPB INHALE 1 PUFF INTO THE LUNGS TWICE DAILY 05/15/23  Yes Duanne Limerick, MD  Glucos-Chond-Hyal Ac-Ca Fructo (MOVE FREE JOINT HEALTH ADVANCE PO) Take 1 tablet by mouth daily.   Yes [provider]  meloxicam (MOBIC) 7.5 MG tablet TAKE 1 TABLET(7.5 MG) BY MOUTH DAILY  06/28/23  Yes Duanne Limerick, MD  omeprazole (PRILOSEC) 20 MG capsule Take 1 capsule (20 mg total) by mouth daily. 04/23/23  Yes Duanne Limerick, MD  polyethylene glycol (MIRALAX / GLYCOLAX) 17 g packet Take 17 g by mouth daily. 12/23/20  Yes Rhetta Mura, MD  ramipril (ALTACE) 2.5 MG capsule Take 2.5 mg by mouth daily. 02/06/21  Yes [provider]  senna-docusate (SENOKOT-S) 8.6-50 MG tablet Take 1 tablet by mouth at bedtime as needed for mild constipation. 12/22/20  Yes Rhetta Mura, MD  vitamin B-12 (CYANOCOBALAMIN) 1000 MCG tablet Take 1,000 mcg by mouth daily.   Yes [provider]  Zinc Sulfate (ZINC 15 PO) Take 1 tablet by mouth daily.   Yes [provider]    Family History Family History  Problem Relation Age of Onset   Breast cancer Neg Hx     Social History Social History   Tobacco Use   Smoking status: Former    Types: Cigarettes   Smokeless tobacco: Never   Tobacco comments:    quit over 50 years  Vaping Use   Vaping status: Never Used  Substance Use Topics   Alcohol use: Yes    Alcohol/week: 1.0 standard drink of alcohol    Types: 1 Cans of beer per week   Drug use: Never     Allergies   Sulfa antibiotics   Review of Systems Review of Systems  Constitutional:  Negative for fatigue.  Respiratory:  Negative for shortness of breath.   Cardiovascular:  Negative for chest pain and palpitations.  Gastrointestinal:  Negative for abdominal pain.  Musculoskeletal:  Positive for arthralgias and joint swelling. Negative for back pain, gait problem, neck pain and neck stiffness.  Skin:  Positive for color change. Negative for wound.  Neurological:  Negative for dizziness, syncope, weakness, numbness and headaches.     Physical Exam Triage Vital Signs ED Triage Vitals  Encounter Vitals Group     BP 06/29/23 1055 130/80     Systolic BP Percentile --      Diastolic BP Percentile --      Pulse Rate 06/29/23 1055 77      Resp 06/29/23 1055 20     Temp 06/29/23 1055 98 F (36.7 C)     Temp Source 06/29/23 1055 Oral     SpO2 06/29/23 1055 97 %     Weight --      Height --      Head Circumference --      Peak Flow --      Pain Score 06/29/23 1054 7     Pain Loc --      Pain Education --      Exclude from Growth Chart --    No data found.  Updated Vital Signs BP 130/80 (BP Location: Left Arm)   Pulse 77   Temp 98 F (36.7 C) (Oral)   Resp 20  SpO2 97%   Physical Exam Vitals and nursing note reviewed.  Constitutional:      General: She is not in acute distress.    Appearance: Normal appearance. She is not ill-appearing or toxic-appearing.  HENT:     Head: Normocephalic and atraumatic.     Nose: Nose normal.     Mouth/Throat:     Mouth: Mucous membranes are moist.     Pharynx: Oropharynx is clear.  Eyes:     General: No scleral icterus.       Right eye: No discharge.        Left eye: No discharge.     Extraocular Movements: Extraocular movements intact.     Conjunctiva/sclera: Conjunctivae normal.     Pupils: Pupils are equal, round, and reactive to light.  Cardiovascular:     Rate and Rhythm: Normal rate and regular rhythm.     Heart sounds: Normal heart sounds.  Pulmonary:     Effort: Pulmonary effort is normal. No respiratory distress.     Breath sounds: Normal breath sounds.  Musculoskeletal:     Right shoulder: Swelling (mild swelling and significant contusion right upper arm, shoulder, elbow) and bony tenderness (proximal humerus) present. Decreased range of motion (90 degrees flexion and abduction).     Right elbow: No swelling. Normal range of motion. No tenderness.     Cervical back: Normal range of motion and neck supple. No tenderness.     Comments: 5/5 strength bilateral upper extremities. Good pulses.   Skin:    General: Skin is dry.  Neurological:     General: No focal deficit present.     Mental Status: She is alert and oriented to person, place, and time. Mental  status is at baseline.     Motor: No weakness.     Gait: Gait normal.  Psychiatric:        Mood and Affect: Mood normal.        Behavior: Behavior normal.      UC Treatments / Results  Labs (all labs ordered are listed, but only abnormal results are displayed) Labs Reviewed - No data to display  EKG   Radiology No results found.  Procedures Procedures (including critical care time)  Medications Ordered in UC Medications - No data to display  Initial Impression / Assessment and Plan / UC Course  I have reviewed the triage vital signs and the nursing notes.  Pertinent labs & imaging results that were available during my care of the patient were reviewed by me and considered in my medical decision making (see chart for details).   82 year old female presents for right shoulder pain, swelling, contusion after accidental fall onto shoulder 5 days ago.  Has been icing the area and taking ibuprofen and symptoms have improved.  On exam she has mild swelling and significant contusion of the right shoulder and right upper arm to the elbow.  Full range of motion of elbow no tenderness from the elbow down.  Tenderness of the mid upper arm and proximal humerus.  Reduced range of motion of shoulder in all directions due to pain and discomfort.  X-ray obtained today shows a proximal humeral head fracture.  Reviewed this with patient.  Advised that she needs to see orthopedics.  Discussed that there are a lot of arteries and nerves in this area and it is very risky for her to just treat this at home.  Information provided for her to contact orthopedics.  Encouraged her to contact orthopedics  offices today.  She has been placed in a sling and advised not to use affected extremity until cleared by Ortho.  Advised to continue Tylenol, ice and immobilization.  Reviewed going to ED if sudden acute worsening of the pain, numbness, weakness of arm.  X-ray over read shows mildly displaced and  comminuted proximal right humeral head fracture.  Acute complicated injury/fracture.  Discussed taking at home meloxicam and Tylenol.  May need surgical repair.  Final Clinical Impressions(s) / UC Diagnoses   Final diagnoses:  Closed fracture of head of right humerus, initial encounter  Fall, initial encounter     Discharge Instructions      -There is a fracture of the proximal humerus. - This is an injury that you will need to see orthopedics for.  You may need further imaging such as CT or MRI to see if you have torn part of your rotator cuff. - As we discussed there are a lot of arteries and nerves in this area so you definitely need to follow-up with Ortho.  I am unsure if this will be treated with surgery or just monitoring and wearing the sling. - Wear the sling until cleared by Ortho.  No use of right arm until cleared by Ortho. - May continue with OTC meds as needed, ice. - Contact these offices below and make an appointment as soon as possible to follow-up with Ortho.  You have a condition requiring you to follow up with Orthopedics so please call one of the following office for appointment:   [facility] Address: [address], [place], [place] [address] Phone: [phone]  [facility] [address], [place], [place] [address] Phone: [phone]  [facility] [address], [place], [place] [address] Phone: [phone]   -May go to [facility] for images on disc.     ED Prescriptions   None    PDMP not reviewed this encounter.   Shirlee Latch, PA-C 06/29/23 [phone]

## 2023-06-29 NOTE — Discharge Instructions (Addendum)
-  There is a fracture of the proximal humerus. - This is an injury that you will need to see orthopedics for.  You may need further imaging such as CT or MRI to see if you have torn part of your rotator cuff. - As we discussed there are a lot of arteries and nerves in this area so you definitely need to follow-up with Ortho.  I am unsure if this will be treated with surgery or just monitoring and wearing the sling. - Wear the sling until cleared by Ortho.  No use of right arm until cleared by Ortho. - May continue with OTC meds as needed, ice. - Contact these offices below and make an appointment as soon as possible to follow-up with Ortho.  You have a condition requiring you to follow up with Orthopedics so please call one of the following office for appointment:   [facility] Address: [address], [place], [place] [address] Phone: [phone]  [facility] [address] [place], [place] [address] Phone: [phone]  [facility] [address], [place], [place] [address] Phone: [phone]   -May go to [facility] for images on disc.

## 2023-06-29 NOTE — ED Triage Notes (Signed)
Patient states that she fell about 5 days ago. Got tripped up and landed on her right shoulder.

## 2023-07-03 DIAGNOSIS — S52124A Nondisplaced fracture of head of right radius, initial encounter for closed fracture: Secondary | ICD-10-CM | POA: Diagnosis not present

## 2023-07-03 DIAGNOSIS — S42201A Unspecified fracture of upper end of right humerus, initial encounter for closed fracture: Secondary | ICD-10-CM | POA: Diagnosis not present

## 2023-07-14 ENCOUNTER — Other Ambulatory Visit: Payer: Self-pay

## 2023-07-14 DIAGNOSIS — J441 Chronic obstructive pulmonary disease with (acute) exacerbation: Secondary | ICD-10-CM

## 2023-07-14 MED ORDER — FLUTICASONE-SALMETEROL 250-50 MCG/ACT IN AEPB: INHALATION_SPRAY | RESPIRATORY_TRACT | 1 refills | Status: DC

## 2023-07-20 ENCOUNTER — Other Ambulatory Visit: Payer: Self-pay

## 2023-07-20 DIAGNOSIS — T148XXA Other injury of unspecified body region, initial encounter: Secondary | ICD-10-CM

## 2023-07-30 DIAGNOSIS — S42201A Unspecified fracture of upper end of right humerus, initial encounter for closed fracture: Secondary | ICD-10-CM | POA: Diagnosis not present

## 2023-07-30 DIAGNOSIS — M25511 Pain in right shoulder: Secondary | ICD-10-CM | POA: Diagnosis not present

## 2023-07-30 DIAGNOSIS — S52124A Nondisplaced fracture of head of right radius, initial encounter for closed fracture: Secondary | ICD-10-CM | POA: Diagnosis not present

## 2023-08-17 DIAGNOSIS — M25511 Pain in right shoulder: Secondary | ICD-10-CM | POA: Diagnosis not present

## 2023-08-17 DIAGNOSIS — R293 Abnormal posture: Secondary | ICD-10-CM | POA: Diagnosis not present

## 2023-08-17 DIAGNOSIS — S42201D Unspecified fracture of upper end of right humerus, subsequent encounter for fracture with routine healing: Secondary | ICD-10-CM | POA: Diagnosis not present

## 2023-08-20 DIAGNOSIS — M25511 Pain in right shoulder: Secondary | ICD-10-CM | POA: Diagnosis not present

## 2023-08-21 DIAGNOSIS — R293 Abnormal posture: Secondary | ICD-10-CM | POA: Diagnosis not present

## 2023-08-21 DIAGNOSIS — S42201D Unspecified fracture of upper end of right humerus, subsequent encounter for fracture with routine healing: Secondary | ICD-10-CM | POA: Diagnosis not present

## 2023-08-21 DIAGNOSIS — M25511 Pain in right shoulder: Secondary | ICD-10-CM | POA: Diagnosis not present

## 2023-08-24 DIAGNOSIS — M25511 Pain in right shoulder: Secondary | ICD-10-CM | POA: Diagnosis not present

## 2023-08-24 DIAGNOSIS — S42201D Unspecified fracture of upper end of right humerus, subsequent encounter for fracture with routine healing: Secondary | ICD-10-CM | POA: Diagnosis not present

## 2023-08-24 DIAGNOSIS — R293 Abnormal posture: Secondary | ICD-10-CM | POA: Diagnosis not present

## 2023-08-27 DIAGNOSIS — S42201D Unspecified fracture of upper end of right humerus, subsequent encounter for fracture with routine healing: Secondary | ICD-10-CM | POA: Diagnosis not present

## 2023-08-27 DIAGNOSIS — R293 Abnormal posture: Secondary | ICD-10-CM | POA: Diagnosis not present

## 2023-08-27 DIAGNOSIS — M25511 Pain in right shoulder: Secondary | ICD-10-CM | POA: Diagnosis not present

## 2023-08-31 DIAGNOSIS — R293 Abnormal posture: Secondary | ICD-10-CM | POA: Diagnosis not present

## 2023-08-31 DIAGNOSIS — S42201D Unspecified fracture of upper end of right humerus, subsequent encounter for fracture with routine healing: Secondary | ICD-10-CM | POA: Diagnosis not present

## 2023-08-31 DIAGNOSIS — M25511 Pain in right shoulder: Secondary | ICD-10-CM | POA: Diagnosis not present

## 2023-09-03 DIAGNOSIS — R293 Abnormal posture: Secondary | ICD-10-CM | POA: Diagnosis not present

## 2023-09-03 DIAGNOSIS — M25511 Pain in right shoulder: Secondary | ICD-10-CM | POA: Diagnosis not present

## 2023-09-03 DIAGNOSIS — S42201D Unspecified fracture of upper end of right humerus, subsequent encounter for fracture with routine healing: Secondary | ICD-10-CM | POA: Diagnosis not present

## 2023-09-07 DIAGNOSIS — R293 Abnormal posture: Secondary | ICD-10-CM | POA: Diagnosis not present

## 2023-09-07 DIAGNOSIS — M25511 Pain in right shoulder: Secondary | ICD-10-CM | POA: Diagnosis not present

## 2023-09-07 DIAGNOSIS — S42201D Unspecified fracture of upper end of right humerus, subsequent encounter for fracture with routine healing: Secondary | ICD-10-CM | POA: Diagnosis not present

## 2023-09-17 ENCOUNTER — Other Ambulatory Visit: Payer: Self-pay | Admitting: Family Medicine

## 2023-09-17 DIAGNOSIS — R293 Abnormal posture: Secondary | ICD-10-CM | POA: Diagnosis not present

## 2023-09-17 DIAGNOSIS — S42201D Unspecified fracture of upper end of right humerus, subsequent encounter for fracture with routine healing: Secondary | ICD-10-CM | POA: Diagnosis not present

## 2023-09-17 DIAGNOSIS — J441 Chronic obstructive pulmonary disease with (acute) exacerbation: Secondary | ICD-10-CM

## 2023-09-17 DIAGNOSIS — M25511 Pain in right shoulder: Secondary | ICD-10-CM | POA: Diagnosis not present

## 2023-11-09 ENCOUNTER — Other Ambulatory Visit: Payer: Self-pay | Admitting: Family Medicine

## 2023-11-09 DIAGNOSIS — J441 Chronic obstructive pulmonary disease with (acute) exacerbation: Secondary | ICD-10-CM

## 2023-11-12 ENCOUNTER — Telehealth: Payer: Self-pay | Admitting: Family Medicine

## 2023-11-12 NOTE — Telephone Encounter (Signed)
 Copied from CRM [id number]. Topic: General - Call Back - No Documentation >> Nov 12, 2023 11:44 AM Higinio Roger wrote: Reason for CRM: Nurse April from [facility] would like a callback. She wants to know if patient received appointment for bone density test. Callback #: [phone]

## 2023-11-13 ENCOUNTER — Other Ambulatory Visit: Payer: Self-pay | Admitting: Family Medicine

## 2023-11-13 DIAGNOSIS — J441 Chronic obstructive pulmonary disease with (acute) exacerbation: Secondary | ICD-10-CM

## 2023-11-13 NOTE — Telephone Encounter (Signed)
 Requested Prescriptions  Refused Prescriptions Disp Refills   WIXELA INHUB 250-50 MCG/ACT AEPB [Pharmacy Med Name: WIXELA INHUB DISKUS 250/50MCG 60S] 60 each 1    Sig: INHALE 1 PUFF INTO THE LUNGS TWICE DAILY     Pulmonology:  Combination Products Passed - 11/13/2023  4:31 PM      Passed - Valid encounter within last 12 months    Recent Outpatient Visits           6 months ago Essential hypertension   [facility]   1 year ago Essential hypertension   [facility] [facility]   1 year ago Dehydration   [facility]   1 year ago Essential hypertension   [place] [facility]   2 years ago Essential hypertension   [facility]

## 2023-11-16 NOTE — Telephone Encounter (Signed)
Please call pt for appt for HTN.  KP

## 2023-11-16 NOTE — Telephone Encounter (Signed)
 Noted  KP

## 2023-11-18 ENCOUNTER — Encounter: Payer: Self-pay | Admitting: Family Medicine

## 2023-11-21 ENCOUNTER — Other Ambulatory Visit: Payer: Self-pay | Admitting: Family Medicine

## 2023-11-21 DIAGNOSIS — I10 Essential (primary) hypertension: Secondary | ICD-10-CM

## 2023-11-21 DIAGNOSIS — F5101 Primary insomnia: Secondary | ICD-10-CM

## 2024-01-04 ENCOUNTER — Telehealth: Payer: Self-pay | Admitting: Family Medicine

## 2024-01-04 NOTE — Telephone Encounter (Signed)
 Copied from CRM [id number]. Topic: Clinical - Lab/Test Results >> Jan 04, 2024  1:36 PM Phil Braun wrote: Reason for CRM:   Ref Bone density test  Fredrik Jensen with [facility], [phone] HIPAA secure line if you need to leave a message.   [employer] needs to know if pt has completed bone density testing

## 2024-01-05 NOTE — Telephone Encounter (Signed)
 Pts last bone density was in 2011.  KP

## 2024-01-12 ENCOUNTER — Other Ambulatory Visit: Payer: Self-pay | Admitting: Family Medicine

## 2024-01-12 DIAGNOSIS — J441 Chronic obstructive pulmonary disease with (acute) exacerbation: Secondary | ICD-10-CM

## 2024-01-27 ENCOUNTER — Ambulatory Visit: Payer: Medicare HMO

## 2024-02-22 ENCOUNTER — Other Ambulatory Visit: Payer: Self-pay

## 2024-02-22 DIAGNOSIS — J302 Other seasonal allergic rhinitis: Secondary | ICD-10-CM

## 2024-02-22 DIAGNOSIS — J441 Chronic obstructive pulmonary disease with (acute) exacerbation: Secondary | ICD-10-CM

## 2024-02-22 MED ORDER — FLUTICASONE-SALMETEROL 250-50 MCG/ACT IN AEPB: 1.0000 | INHALATION_SPRAY | Freq: Two times a day (BID) | RESPIRATORY_TRACT | 0 refills | Status: AC

## 2024-02-22 MED ORDER — FLUTICASONE PROPIONATE 50 MCG/ACT NA SUSP: NASAL | 0 refills | Status: AC

## 2024-03-19 ENCOUNTER — Other Ambulatory Visit: Payer: Self-pay | Admitting: Student

## 2024-03-19 DIAGNOSIS — J302 Other seasonal allergic rhinitis: Secondary | ICD-10-CM

## 2024-03-22 NOTE — Telephone Encounter (Signed)
 Needs to establish with new PCP. Pt also has internal med appt with [facility] too.

## 2024-03-22 NOTE — Telephone Encounter (Signed)
 Requested medication (s) are due for refill today: yes  Requested medication (s) are on the active medication list: yes  Last refill: 02/22/24  Future visit scheduled: no  Notes to clinic:  Unable to refill per protocol, previous Dr. Joshua patient, routing for review.     Requested Prescriptions  Pending Prescriptions Disp Refills   fluticasone  (FLONASE ) 50 MCG/ACT nasal spray [Pharmacy Med Name: FLUTICASONE  NASAL SP (120) RX] 16 g 0    Sig: SHAKE LIQUID AND USE 1 SPRAY IN EACH NOSTRIL TWICE DAILY     Ear, Nose, and Throat: Nasal Preparations - Corticosteroids Failed - 03/22/2024  8:15 AM      Failed - Valid encounter within last 12 months    Recent Outpatient Visits   None

## 2024-04-20 ENCOUNTER — Other Ambulatory Visit: Payer: Self-pay | Admitting: Student

## 2024-04-20 DIAGNOSIS — J302 Other seasonal allergic rhinitis: Secondary | ICD-10-CM

## 2024-04-22 NOTE — Telephone Encounter (Signed)
 Requested Prescriptions  Refused Prescriptions Disp Refills   fluticasone  (FLONASE ) 50 MCG/ACT nasal spray [Pharmacy Med Name: FLUTICASONE  NASAL SP (120) RX] 16 g 0    Sig: SHAKE LIQUID AND USE 1 SPRAY IN EACH NOSTRIL TWICE DAILY     Ear, Nose, and Throat: Nasal Preparations - Corticosteroids Failed - 04/22/2024  2:00 PM      Failed - Valid encounter within last 12 months    Recent Outpatient Visits   None

## 2024-05-26 ENCOUNTER — Telehealth: Payer: Self-pay

## 2024-05-26 NOTE — Telephone Encounter (Signed)
 TOC to refill medications with new provider.

## 2024-07-24 ENCOUNTER — Encounter: Payer: Self-pay | Admitting: Emergency Medicine

## 2024-07-24 ENCOUNTER — Ambulatory Visit: Admission: EM | Admit: 2024-07-24 | Discharge: 2024-07-24 | Disposition: A | Discharge status: Home / Self Care

## 2024-07-24 DIAGNOSIS — W19XXXA Unspecified fall, initial encounter: Secondary | ICD-10-CM | POA: Diagnosis not present

## 2024-07-24 DIAGNOSIS — S0990XA Unspecified injury of head, initial encounter: Secondary | ICD-10-CM | POA: Diagnosis not present

## 2024-07-24 DIAGNOSIS — M79641 Pain in right hand: Secondary | ICD-10-CM

## 2024-07-24 DIAGNOSIS — M25561 Pain in right knee: Secondary | ICD-10-CM

## 2024-07-24 NOTE — ED Notes (Signed)
 Patient is being discharged from the Urgent Care and sent to the Emergency Department via private vehicle with family . Per Harlene Code, NP, patient is in need of higher level of care due to Fall with Head Injury and headache. Patient is aware and verbalizes understanding of plan of care.  Vitals:   07/24/24 1228  BP: 120/68  Pulse: 87  Resp: 14  Temp: 98.3 F (36.8 C)  SpO2: 97%

## 2024-07-24 NOTE — ED Triage Notes (Signed)
 Patient states that she feel on some steps today when she was going into church.  Patient states that she hit the right side of her head on the cement steps.  Patient also reports pain in her right hand and right knee where she has skin tear there.  Patient denies LOC. Patient reports headache.

## 2024-07-24 NOTE — Discharge Instructions (Signed)
 Please go to the emergency room for further evaluation and management of the fall and head injury.

## 2024-07-24 NOTE — ED Provider Notes (Signed)
 [facility]    CSN: [id number] Arrival date & time: 07/24/24  1210      History   Chief Complaint Chief Complaint  Patient presents with   Fall   Head Injury    HPI Marie Villa is a 83 y.o. female.   Patient presents today with female family member for fall and head injury.  Reports she was walking into church when she fell, hitting her head on a brick.  She also scraped her right hand and right knee.  She denies feeling dizzy or her vision going black.  No chest pain, shortness of breath, or trouble breathing prior to the fall.  She does not remember exactly what happened or why she fell if she tripped over something or lost her balance.  Reports she does have a headache.  Denies losing consciousness.  Denies blood thinner use.    Past Medical History:  Diagnosis Date   Congestive heart disease (HCC)    COPD (chronic obstructive pulmonary disease) (HCC)    GERD (gastroesophageal reflux disease)    Hyperlipidemia    Hypertension     Patient Active Problem List   Diagnosis Date Noted   History of anemia 10/09/2021   Closed left hip fracture (HCC) 12/19/2020   Head trauma 12/17/2020   Visit for suture removal 12/17/2020   Primary insomnia 10/22/2020   Left knee pain 11/25/2018   Primary osteoarthritis of left knee 11/25/2018   Essential hypertension 10/12/2017   COPD (chronic obstructive pulmonary disease) (HCC) 04/10/2017   Simple chronic bronchitis (HCC) 06/09/2016   Other seasonal allergic rhinitis 06/09/2016   Cardiomyopathy (HCC) 06/06/2015   Chronic kidney disease, stage 3b (HCC) 06/06/2015   CCF (congestive cardiac failure) (HCC) 06/06/2015   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/06/2015   Gastroesophageal reflux disease 06/06/2015   Hypercholesteremia 06/06/2015   Cardiac murmur 12/16/2013   SOB (shortness of breath) 12/16/2013    Past Surgical History:  Procedure Laterality Date   CATARACT EXTRACTION, BILATERAL     COLONOSCOPY   2012   normal- cleared for 10- Iftikhar   HIP ARTHROPLASTY Left 12/20/2020   Procedure: ARTHROPLASTY BIPOLAR HIP (HEMIARTHROPLASTY);  Surgeon: Tobie Priest, MD;  Location: [facility] ORS;  Service: Orthopedics;  Laterality: Left;   VAGINAL HYSTERECTOMY      OB History   No obstetric history on file.      Home Medications    Prior to Admission medications   Medication Sig Start Date End Date Taking? Authorizing Provider  acetaminophen  (TYLENOL ) 325 MG tablet Take 325 mg by mouth as needed.    [provider]  atorvastatin  (LIPITOR) 20 MG tablet Take 1 tablet (20 mg total) by mouth daily. 04/23/23   Joshua Cathryne JAYSON, MD  Calcium  Carb-Cholecalciferol  (CALCIUM -VITAMIN D ) 600-400 MG-UNIT TABS Take 2 tablets by mouth daily.    [provider]  carvedilol  (COREG ) 3.125 MG tablet Take 1 tablet (3.125 mg total) by mouth 2 (two) times daily. 04/23/23   Joshua Cathryne JAYSON, MD  cetirizine  (ZYRTEC ) 10 MG tablet Take 1 tablet (10 mg total) by mouth daily. 04/23/23   Joshua Cathryne JAYSON, MD  famotidine  (PEPCID ) 40 MG tablet Take 1 tablet (40 mg total) by mouth daily. 04/23/23   Joshua Cathryne JAYSON, MD  ferrous sulfate  324 MG TBEC Take 324 mg by mouth daily.    [provider]  fexofenadine (ALLEGRA) 180 MG tablet Take 180 mg by mouth daily.    [provider]  fluticasone  (FLONASE ) 50 MCG/ACT  nasal spray SHAKE LIQUID AND USE 1 SPRAY IN EACH NOSTRIL TWICE DAILY 02/22/24   Lemon Raisin, MD  fluticasone -salmeterol (WIXELA INHUB) 250-50 MCG/ACT AEPB Inhale 1 puff into the lungs in the morning and at bedtime. 02/22/24   Lemon Raisin, MD  Glucos-Chond-Hyal Ac-Ca Fructo (MOVE FREE JOINT HEALTH ADVANCE PO) Take 1 tablet by mouth daily.    [provider]  meloxicam  (MOBIC ) 7.5 MG tablet TAKE 1 TABLET(7.5 MG) BY MOUTH DAILY 06/28/23   Joshua Cathryne BROCKS, MD  omeprazole  (PRILOSEC) 20 MG capsule Take 1 capsule (20 mg total) by mouth daily. 04/23/23   Joshua Cathryne BROCKS, MD  polyethylene glycol  (MIRALAX  / GLYCOLAX ) 17 g packet Take 17 g by mouth daily. 12/23/20   Samtani, Jai-Gurmukh, MD  ramipril  (ALTACE ) 2.5 MG capsule Take 2.5 mg by mouth daily. 02/06/21   [provider]  senna-docusate (SENOKOT-S) 8.6-50 MG tablet Take 1 tablet by mouth at bedtime as needed for mild constipation. 12/22/20   Samtani, Jai-Gurmukh, MD  vitamin B-12 (CYANOCOBALAMIN ) 1000 MCG tablet Take 1,000 mcg by mouth daily.    [provider]  Zinc Sulfate (ZINC 15 PO) Take 1 tablet by mouth daily.    [provider]    Family History Family History  Problem Relation Age of Onset   Breast cancer Neg Hx     Social History Social History   Tobacco Use   Smoking status: Former    Types: Cigarettes   Smokeless tobacco: Never   Tobacco comments:    quit over 50 years  Vaping Use   Vaping status: Never Used  Substance Use Topics   Alcohol use: Yes    Alcohol/week: 1.0 standard drink of alcohol    Types: 1 Cans of beer per week   Drug use: Never     Allergies   Sulfa antibiotics   Review of Systems Review of Systems Per HPI  Physical Exam Triage Vital Signs ED Triage Vitals  Encounter Vitals Group     BP 07/24/24 1228 120/68     Girls Systolic BP Percentile --      Girls Diastolic BP Percentile --      Boys Systolic BP Percentile --      Boys Diastolic BP Percentile --      Pulse Rate 07/24/24 1228 87     Resp 07/24/24 1228 14     Temp 07/24/24 1228 98.3 F (36.8 C)     Temp Source 07/24/24 1228 Oral     SpO2 07/24/24 1228 97 %     Weight 07/24/24 1227 130 lb 15.3 oz (59.4 kg)     Height 07/24/24 1227 5' 5 (1.651 m)     Head Circumference --      Peak Flow --      Pain Score 07/24/24 1226 2     Pain Loc --      Pain Education --      Exclude from Growth Chart --    No data found.  Updated Vital Signs BP 120/68 (BP Location: Left Arm)   Pulse 87   Temp 98.3 F (36.8 C) (Oral)   Resp 14   Ht 5' 5 (1.651 m)   Wt 130 lb 15.3 oz (59.4 kg)   SpO2  97%   BMI 21.79 kg/m   Visual Acuity Right Eye Distance:   Left Eye Distance:   Bilateral Distance:    Right Eye Near:   Left Eye Near:    Bilateral Near:  Physical Exam Vitals and nursing note reviewed.  Constitutional:      General: She is not in acute distress.    Appearance: Normal appearance. She is not toxic-appearing.  HENT:     Head: Normocephalic.     Comments: Contusion noted to right temple with puncture wound/laceration.  Bleeding is controlled.    Right Ear: External ear normal.     Left Ear: External ear normal.     Mouth/Throat:     Mouth: Mucous membranes are moist.     Pharynx: Oropharynx is clear. No oropharyngeal exudate or posterior oropharyngeal erythema.  Cardiovascular:     Rate and Rhythm: Normal rate.  Pulmonary:     Effort: Pulmonary effort is normal. No respiratory distress.  Skin:    Comments: Puncture wound right temple; there are also wounds to right hand and right knee; covered with bandages currently  Neurological:     Mental Status: She is alert.      UC Treatments / Results  Labs (all labs ordered are listed, but only abnormal results are displayed) Labs Reviewed - No data to display  EKG   Radiology No results found.  Procedures Procedures (including critical care time)  Medications Ordered in UC Medications - No data to display  Initial Impression / Assessment and Plan / UC Course  I have reviewed the triage vital signs and the nursing notes.  Pertinent labs & imaging results that were available during my care of the patient were reviewed by me and considered in my medical decision making (see chart for details).   In triage, patient is well-appearing and vital signs are stable.  She appears elderly.  Given age and mechanism of injury, I recommended further evaluation and management in the emergency room for head injury.  Patient and family member are in agreement to plan.  Patient is safe to transport via private  vehicle.  Ice applied to the head contusion prior to discharge.  The patient was given the opportunity to ask questions.  All questions answered to their satisfaction.  The patient is in agreement to this plan.   Final Clinical Impressions(s) / UC Diagnoses   Final diagnoses:  Fall, initial encounter  Injury of head, initial encounter  Right hand pain  Acute pain of right knee     Discharge Instructions      Please go to the emergency room for further evaluation and management of the fall and head injury.    ED Prescriptions   None    PDMP not reviewed this encounter.   Chandra Harlene LABOR, NP 07/24/24 1242
